# Patient Record
Sex: Female | Born: 1935
Health system: Southern US, Community
[De-identification: ages and names within clinical notes are randomized; demographics above are authoritative.]

## PROBLEM LIST (undated history)

## (undated) DIAGNOSIS — E119 Type 2 diabetes mellitus without complications: Secondary | ICD-10-CM

## (undated) DIAGNOSIS — D649 Anemia, unspecified: Secondary | ICD-10-CM

## (undated) DIAGNOSIS — M549 Dorsalgia, unspecified: Secondary | ICD-10-CM

## (undated) DIAGNOSIS — N2889 Other specified disorders of kidney and ureter: Secondary | ICD-10-CM

## (undated) DIAGNOSIS — I10 Essential (primary) hypertension: Secondary | ICD-10-CM

## (undated) DIAGNOSIS — E559 Vitamin D deficiency, unspecified: Secondary | ICD-10-CM

## (undated) DIAGNOSIS — C801 Malignant (primary) neoplasm, unspecified: Secondary | ICD-10-CM

## (undated) DIAGNOSIS — I499 Cardiac arrhythmia, unspecified: Secondary | ICD-10-CM

## (undated) DIAGNOSIS — I209 Angina pectoris, unspecified: Secondary | ICD-10-CM

## (undated) DIAGNOSIS — G8929 Other chronic pain: Secondary | ICD-10-CM

## (undated) DIAGNOSIS — I4891 Unspecified atrial fibrillation: Secondary | ICD-10-CM

## (undated) DIAGNOSIS — N189 Chronic kidney disease, unspecified: Secondary | ICD-10-CM

## (undated) DIAGNOSIS — E538 Deficiency of other specified B group vitamins: Secondary | ICD-10-CM

## (undated) DIAGNOSIS — M1990 Unspecified osteoarthritis, unspecified site: Secondary | ICD-10-CM

## (undated) DIAGNOSIS — I509 Heart failure, unspecified: Secondary | ICD-10-CM

## (undated) DIAGNOSIS — M810 Age-related osteoporosis without current pathological fracture: Secondary | ICD-10-CM

## (undated) HISTORY — PX: CATARACT EXTRACTION, BILATERAL: SHX1313

## (undated) HISTORY — PX: BREAST LUMPECTOMY: SHX2

## (undated) HISTORY — PX: CHOLECYSTECTOMY: SHX55

## (undated) HISTORY — PX: CARDIAC CATHETERIZATION: SHX172

## (undated) HISTORY — PX: TOTAL HIP ARTHROPLASTY: SHX124

---

## 2014-10-17 DIAGNOSIS — M545 Low back pain: Secondary | ICD-10-CM | POA: Diagnosis not present

## 2014-10-17 DIAGNOSIS — Z79899 Other long term (current) drug therapy: Secondary | ICD-10-CM | POA: Diagnosis not present

## 2014-10-17 DIAGNOSIS — E785 Hyperlipidemia, unspecified: Secondary | ICD-10-CM | POA: Diagnosis not present

## 2014-10-17 DIAGNOSIS — C50919 Malignant neoplasm of unspecified site of unspecified female breast: Secondary | ICD-10-CM | POA: Diagnosis not present

## 2014-10-17 DIAGNOSIS — M818 Other osteoporosis without current pathological fracture: Secondary | ICD-10-CM | POA: Diagnosis not present

## 2014-10-17 DIAGNOSIS — R32 Unspecified urinary incontinence: Secondary | ICD-10-CM | POA: Diagnosis not present

## 2014-10-17 DIAGNOSIS — E119 Type 2 diabetes mellitus without complications: Secondary | ICD-10-CM | POA: Diagnosis not present

## 2014-11-10 DIAGNOSIS — Z170 Estrogen receptor positive status [ER+]: Secondary | ICD-10-CM | POA: Diagnosis not present

## 2014-11-10 DIAGNOSIS — Z853 Personal history of malignant neoplasm of breast: Secondary | ICD-10-CM | POA: Diagnosis not present

## 2015-01-15 DIAGNOSIS — Z6831 Body mass index (BMI) 31.0-31.9, adult: Secondary | ICD-10-CM | POA: Diagnosis not present

## 2015-01-15 DIAGNOSIS — E669 Obesity, unspecified: Secondary | ICD-10-CM | POA: Diagnosis not present

## 2015-01-15 DIAGNOSIS — C50212 Malignant neoplasm of upper-inner quadrant of left female breast: Secondary | ICD-10-CM | POA: Diagnosis not present

## 2015-01-15 DIAGNOSIS — Z170 Estrogen receptor positive status [ER+]: Secondary | ICD-10-CM | POA: Diagnosis not present

## 2015-01-19 DIAGNOSIS — F329 Major depressive disorder, single episode, unspecified: Secondary | ICD-10-CM | POA: Diagnosis not present

## 2015-01-19 DIAGNOSIS — M545 Low back pain: Secondary | ICD-10-CM | POA: Diagnosis not present

## 2015-01-19 DIAGNOSIS — R32 Unspecified urinary incontinence: Secondary | ICD-10-CM | POA: Diagnosis not present

## 2015-01-19 DIAGNOSIS — Z79899 Other long term (current) drug therapy: Secondary | ICD-10-CM | POA: Diagnosis not present

## 2015-01-19 DIAGNOSIS — E559 Vitamin D deficiency, unspecified: Secondary | ICD-10-CM | POA: Diagnosis not present

## 2015-01-19 DIAGNOSIS — E785 Hyperlipidemia, unspecified: Secondary | ICD-10-CM | POA: Diagnosis not present

## 2015-01-19 DIAGNOSIS — E119 Type 2 diabetes mellitus without complications: Secondary | ICD-10-CM | POA: Diagnosis not present

## 2015-01-19 DIAGNOSIS — C50919 Malignant neoplasm of unspecified site of unspecified female breast: Secondary | ICD-10-CM | POA: Diagnosis not present

## 2015-01-31 DIAGNOSIS — Z9889 Other specified postprocedural states: Secondary | ICD-10-CM | POA: Diagnosis not present

## 2015-01-31 DIAGNOSIS — C50212 Malignant neoplasm of upper-inner quadrant of left female breast: Secondary | ICD-10-CM | POA: Diagnosis not present

## 2015-01-31 DIAGNOSIS — R928 Other abnormal and inconclusive findings on diagnostic imaging of breast: Secondary | ICD-10-CM | POA: Diagnosis not present

## 2015-02-08 DIAGNOSIS — R928 Other abnormal and inconclusive findings on diagnostic imaging of breast: Secondary | ICD-10-CM | POA: Diagnosis not present

## 2015-02-08 DIAGNOSIS — Z170 Estrogen receptor positive status [ER+]: Secondary | ICD-10-CM | POA: Diagnosis not present

## 2015-02-08 DIAGNOSIS — R920 Mammographic microcalcification found on diagnostic imaging of breast: Secondary | ICD-10-CM | POA: Diagnosis not present

## 2015-02-08 DIAGNOSIS — R921 Mammographic calcification found on diagnostic imaging of breast: Secondary | ICD-10-CM | POA: Diagnosis not present

## 2015-02-08 DIAGNOSIS — Z853 Personal history of malignant neoplasm of breast: Secondary | ICD-10-CM | POA: Diagnosis not present

## 2015-02-08 DIAGNOSIS — N6011 Diffuse cystic mastopathy of right breast: Secondary | ICD-10-CM | POA: Diagnosis not present

## 2015-02-08 DIAGNOSIS — N63 Unspecified lump in breast: Secondary | ICD-10-CM | POA: Diagnosis not present

## 2015-02-08 DIAGNOSIS — Z79811 Long term (current) use of aromatase inhibitors: Secondary | ICD-10-CM | POA: Diagnosis not present

## 2015-02-11 DIAGNOSIS — N6489 Other specified disorders of breast: Secondary | ICD-10-CM | POA: Diagnosis not present

## 2015-02-11 DIAGNOSIS — L7621 Postprocedural hemorrhage and hematoma of skin and subcutaneous tissue following a dermatologic procedure: Secondary | ICD-10-CM | POA: Diagnosis not present

## 2015-02-27 DIAGNOSIS — R609 Edema, unspecified: Secondary | ICD-10-CM | POA: Diagnosis not present

## 2015-02-27 DIAGNOSIS — R0602 Shortness of breath: Secondary | ICD-10-CM | POA: Diagnosis not present

## 2015-02-27 DIAGNOSIS — W57XXXA Bitten or stung by nonvenomous insect and other nonvenomous arthropods, initial encounter: Secondary | ICD-10-CM | POA: Diagnosis not present

## 2015-03-13 DIAGNOSIS — Z7689 Persons encountering health services in other specified circumstances: Secondary | ICD-10-CM | POA: Diagnosis not present

## 2015-03-13 DIAGNOSIS — R079 Chest pain, unspecified: Secondary | ICD-10-CM | POA: Diagnosis not present

## 2015-03-13 DIAGNOSIS — W57XXXA Bitten or stung by nonvenomous insect and other nonvenomous arthropods, initial encounter: Secondary | ICD-10-CM | POA: Diagnosis not present

## 2015-04-20 DIAGNOSIS — F419 Anxiety disorder, unspecified: Secondary | ICD-10-CM | POA: Diagnosis not present

## 2015-04-20 DIAGNOSIS — E785 Hyperlipidemia, unspecified: Secondary | ICD-10-CM | POA: Diagnosis not present

## 2015-04-20 DIAGNOSIS — Z6833 Body mass index (BMI) 33.0-33.9, adult: Secondary | ICD-10-CM | POA: Diagnosis not present

## 2015-04-20 DIAGNOSIS — M818 Other osteoporosis without current pathological fracture: Secondary | ICD-10-CM | POA: Diagnosis not present

## 2015-04-20 DIAGNOSIS — Z139 Encounter for screening, unspecified: Secondary | ICD-10-CM | POA: Diagnosis not present

## 2015-04-20 DIAGNOSIS — E119 Type 2 diabetes mellitus without complications: Secondary | ICD-10-CM | POA: Diagnosis not present

## 2015-05-11 DIAGNOSIS — Z853 Personal history of malignant neoplasm of breast: Secondary | ICD-10-CM | POA: Diagnosis not present

## 2015-05-11 DIAGNOSIS — Z79811 Long term (current) use of aromatase inhibitors: Secondary | ICD-10-CM | POA: Diagnosis not present

## 2015-07-09 DIAGNOSIS — Z6833 Body mass index (BMI) 33.0-33.9, adult: Secondary | ICD-10-CM | POA: Diagnosis not present

## 2015-07-09 DIAGNOSIS — Z23 Encounter for immunization: Secondary | ICD-10-CM | POA: Diagnosis not present

## 2015-07-09 DIAGNOSIS — E119 Type 2 diabetes mellitus without complications: Secondary | ICD-10-CM | POA: Diagnosis not present

## 2015-07-09 DIAGNOSIS — I4891 Unspecified atrial fibrillation: Secondary | ICD-10-CM | POA: Diagnosis not present

## 2015-07-23 DIAGNOSIS — Z79899 Other long term (current) drug therapy: Secondary | ICD-10-CM | POA: Diagnosis not present

## 2015-07-23 DIAGNOSIS — E559 Vitamin D deficiency, unspecified: Secondary | ICD-10-CM | POA: Diagnosis not present

## 2015-07-23 DIAGNOSIS — E119 Type 2 diabetes mellitus without complications: Secondary | ICD-10-CM | POA: Diagnosis not present

## 2015-07-23 DIAGNOSIS — E785 Hyperlipidemia, unspecified: Secondary | ICD-10-CM | POA: Diagnosis not present

## 2015-07-23 DIAGNOSIS — R42 Dizziness and giddiness: Secondary | ICD-10-CM | POA: Diagnosis not present

## 2015-08-07 DIAGNOSIS — S61219A Laceration without foreign body of unspecified finger without damage to nail, initial encounter: Secondary | ICD-10-CM | POA: Diagnosis not present

## 2015-08-07 DIAGNOSIS — I10 Essential (primary) hypertension: Secondary | ICD-10-CM | POA: Diagnosis not present

## 2015-08-07 DIAGNOSIS — E785 Hyperlipidemia, unspecified: Secondary | ICD-10-CM | POA: Diagnosis not present

## 2015-08-07 DIAGNOSIS — Z6833 Body mass index (BMI) 33.0-33.9, adult: Secondary | ICD-10-CM | POA: Diagnosis not present

## 2015-08-10 DIAGNOSIS — Z853 Personal history of malignant neoplasm of breast: Secondary | ICD-10-CM | POA: Diagnosis not present

## 2015-08-29 DIAGNOSIS — R42 Dizziness and giddiness: Secondary | ICD-10-CM | POA: Diagnosis not present

## 2015-09-27 DIAGNOSIS — R269 Unspecified abnormalities of gait and mobility: Secondary | ICD-10-CM | POA: Diagnosis not present

## 2015-09-27 DIAGNOSIS — R42 Dizziness and giddiness: Secondary | ICD-10-CM | POA: Diagnosis not present

## 2015-10-04 DIAGNOSIS — G319 Degenerative disease of nervous system, unspecified: Secondary | ICD-10-CM | POA: Diagnosis not present

## 2015-10-04 DIAGNOSIS — R42 Dizziness and giddiness: Secondary | ICD-10-CM | POA: Diagnosis not present

## 2015-10-08 DIAGNOSIS — R269 Unspecified abnormalities of gait and mobility: Secondary | ICD-10-CM | POA: Diagnosis not present

## 2015-10-08 DIAGNOSIS — R42 Dizziness and giddiness: Secondary | ICD-10-CM | POA: Diagnosis not present

## 2015-10-22 DIAGNOSIS — M545 Low back pain: Secondary | ICD-10-CM | POA: Diagnosis not present

## 2015-10-22 DIAGNOSIS — E119 Type 2 diabetes mellitus without complications: Secondary | ICD-10-CM | POA: Diagnosis not present

## 2015-10-22 DIAGNOSIS — I10 Essential (primary) hypertension: Secondary | ICD-10-CM | POA: Diagnosis not present

## 2015-10-22 DIAGNOSIS — M25551 Pain in right hip: Secondary | ICD-10-CM | POA: Diagnosis not present

## 2015-10-23 DIAGNOSIS — M25551 Pain in right hip: Secondary | ICD-10-CM | POA: Diagnosis not present

## 2015-10-23 DIAGNOSIS — M160 Bilateral primary osteoarthritis of hip: Secondary | ICD-10-CM | POA: Diagnosis not present

## 2015-10-26 DIAGNOSIS — M79604 Pain in right leg: Secondary | ICD-10-CM | POA: Diagnosis not present

## 2015-10-26 DIAGNOSIS — M6281 Muscle weakness (generalized): Secondary | ICD-10-CM | POA: Diagnosis not present

## 2015-10-26 DIAGNOSIS — R269 Unspecified abnormalities of gait and mobility: Secondary | ICD-10-CM | POA: Diagnosis not present

## 2015-10-26 DIAGNOSIS — R42 Dizziness and giddiness: Secondary | ICD-10-CM | POA: Diagnosis not present

## 2015-10-30 DIAGNOSIS — M1611 Unilateral primary osteoarthritis, right hip: Secondary | ICD-10-CM | POA: Diagnosis not present

## 2015-11-02 DIAGNOSIS — R269 Unspecified abnormalities of gait and mobility: Secondary | ICD-10-CM | POA: Diagnosis not present

## 2015-11-02 DIAGNOSIS — M6281 Muscle weakness (generalized): Secondary | ICD-10-CM | POA: Diagnosis not present

## 2015-11-02 DIAGNOSIS — M79604 Pain in right leg: Secondary | ICD-10-CM | POA: Diagnosis not present

## 2015-11-02 DIAGNOSIS — R42 Dizziness and giddiness: Secondary | ICD-10-CM | POA: Diagnosis not present

## 2015-11-09 DIAGNOSIS — Z79811 Long term (current) use of aromatase inhibitors: Secondary | ICD-10-CM | POA: Diagnosis not present

## 2015-11-09 DIAGNOSIS — Z853 Personal history of malignant neoplasm of breast: Secondary | ICD-10-CM | POA: Diagnosis not present

## 2015-11-09 DIAGNOSIS — Z170 Estrogen receptor positive status [ER+]: Secondary | ICD-10-CM | POA: Diagnosis not present

## 2015-11-09 DIAGNOSIS — C50212 Malignant neoplasm of upper-inner quadrant of left female breast: Secondary | ICD-10-CM | POA: Diagnosis not present

## 2015-11-09 DIAGNOSIS — M1990 Unspecified osteoarthritis, unspecified site: Secondary | ICD-10-CM | POA: Diagnosis not present

## 2015-11-12 DIAGNOSIS — M1611 Unilateral primary osteoarthritis, right hip: Secondary | ICD-10-CM | POA: Diagnosis not present

## 2015-11-12 DIAGNOSIS — M25551 Pain in right hip: Secondary | ICD-10-CM | POA: Diagnosis not present

## 2015-12-21 DIAGNOSIS — C50919 Malignant neoplasm of unspecified site of unspecified female breast: Secondary | ICD-10-CM | POA: Diagnosis not present

## 2015-12-21 DIAGNOSIS — M545 Low back pain: Secondary | ICD-10-CM | POA: Diagnosis not present

## 2015-12-21 DIAGNOSIS — M1611 Unilateral primary osteoarthritis, right hip: Secondary | ICD-10-CM | POA: Diagnosis not present

## 2016-01-14 DIAGNOSIS — I509 Heart failure, unspecified: Secondary | ICD-10-CM | POA: Diagnosis not present

## 2016-01-14 DIAGNOSIS — R079 Chest pain, unspecified: Secondary | ICD-10-CM | POA: Diagnosis not present

## 2016-01-14 DIAGNOSIS — R0602 Shortness of breath: Secondary | ICD-10-CM | POA: Diagnosis not present

## 2016-01-18 DIAGNOSIS — E119 Type 2 diabetes mellitus without complications: Secondary | ICD-10-CM | POA: Diagnosis not present

## 2016-01-18 DIAGNOSIS — I509 Heart failure, unspecified: Secondary | ICD-10-CM | POA: Diagnosis not present

## 2016-01-18 DIAGNOSIS — M545 Low back pain: Secondary | ICD-10-CM | POA: Diagnosis not present

## 2016-01-18 DIAGNOSIS — E785 Hyperlipidemia, unspecified: Secondary | ICD-10-CM | POA: Diagnosis not present

## 2016-01-23 DIAGNOSIS — E119 Type 2 diabetes mellitus without complications: Secondary | ICD-10-CM | POA: Diagnosis not present

## 2016-01-23 DIAGNOSIS — E785 Hyperlipidemia, unspecified: Secondary | ICD-10-CM | POA: Diagnosis not present

## 2016-02-08 DIAGNOSIS — M79604 Pain in right leg: Secondary | ICD-10-CM | POA: Diagnosis not present

## 2016-02-08 DIAGNOSIS — M25551 Pain in right hip: Secondary | ICD-10-CM | POA: Diagnosis not present

## 2016-02-08 DIAGNOSIS — R42 Dizziness and giddiness: Secondary | ICD-10-CM | POA: Diagnosis not present

## 2016-02-15 DIAGNOSIS — M1611 Unilateral primary osteoarthritis, right hip: Secondary | ICD-10-CM | POA: Diagnosis not present

## 2016-02-21 DIAGNOSIS — Z79899 Other long term (current) drug therapy: Secondary | ICD-10-CM | POA: Diagnosis not present

## 2016-02-21 DIAGNOSIS — Z01818 Encounter for other preprocedural examination: Secondary | ICD-10-CM | POA: Diagnosis not present

## 2016-02-21 DIAGNOSIS — M79609 Pain in unspecified limb: Secondary | ICD-10-CM | POA: Diagnosis not present

## 2016-02-21 DIAGNOSIS — Z01810 Encounter for preprocedural cardiovascular examination: Secondary | ICD-10-CM | POA: Diagnosis not present

## 2016-02-21 DIAGNOSIS — R52 Pain, unspecified: Secondary | ICD-10-CM | POA: Diagnosis not present

## 2016-02-21 DIAGNOSIS — R0602 Shortness of breath: Secondary | ICD-10-CM | POA: Diagnosis not present

## 2016-02-25 DIAGNOSIS — Z9889 Other specified postprocedural states: Secondary | ICD-10-CM | POA: Diagnosis not present

## 2016-02-25 DIAGNOSIS — R928 Other abnormal and inconclusive findings on diagnostic imaging of breast: Secondary | ICD-10-CM | POA: Diagnosis not present

## 2016-02-25 DIAGNOSIS — C50212 Malignant neoplasm of upper-inner quadrant of left female breast: Secondary | ICD-10-CM | POA: Diagnosis not present

## 2016-02-26 DIAGNOSIS — E559 Vitamin D deficiency, unspecified: Secondary | ICD-10-CM | POA: Diagnosis not present

## 2016-02-26 DIAGNOSIS — J449 Chronic obstructive pulmonary disease, unspecified: Secondary | ICD-10-CM | POA: Diagnosis not present

## 2016-02-26 DIAGNOSIS — R609 Edema, unspecified: Secondary | ICD-10-CM | POA: Diagnosis not present

## 2016-02-26 DIAGNOSIS — E119 Type 2 diabetes mellitus without complications: Secondary | ICD-10-CM | POA: Diagnosis not present

## 2016-02-26 DIAGNOSIS — I4891 Unspecified atrial fibrillation: Secondary | ICD-10-CM | POA: Diagnosis not present

## 2016-02-28 DIAGNOSIS — I119 Hypertensive heart disease without heart failure: Secondary | ICD-10-CM | POA: Insufficient documentation

## 2016-02-28 DIAGNOSIS — I4819 Other persistent atrial fibrillation: Secondary | ICD-10-CM | POA: Insufficient documentation

## 2016-02-28 DIAGNOSIS — Z01810 Encounter for preprocedural cardiovascular examination: Secondary | ICD-10-CM

## 2016-02-28 DIAGNOSIS — I4821 Permanent atrial fibrillation: Secondary | ICD-10-CM | POA: Insufficient documentation

## 2016-02-28 HISTORY — DX: Encounter for preprocedural cardiovascular examination: Z01.810

## 2016-02-28 HISTORY — DX: Other persistent atrial fibrillation: I48.19

## 2016-02-28 HISTORY — DX: Permanent atrial fibrillation: I48.21

## 2016-02-28 HISTORY — DX: Hypertensive heart disease without heart failure: I11.9

## 2016-02-29 DIAGNOSIS — E782 Mixed hyperlipidemia: Secondary | ICD-10-CM

## 2016-02-29 DIAGNOSIS — Z7901 Long term (current) use of anticoagulants: Secondary | ICD-10-CM | POA: Insufficient documentation

## 2016-02-29 DIAGNOSIS — Z01810 Encounter for preprocedural cardiovascular examination: Secondary | ICD-10-CM | POA: Diagnosis not present

## 2016-02-29 DIAGNOSIS — I10 Essential (primary) hypertension: Secondary | ICD-10-CM | POA: Diagnosis not present

## 2016-02-29 DIAGNOSIS — E119 Type 2 diabetes mellitus without complications: Secondary | ICD-10-CM | POA: Insufficient documentation

## 2016-02-29 DIAGNOSIS — R0602 Shortness of breath: Secondary | ICD-10-CM | POA: Diagnosis not present

## 2016-02-29 DIAGNOSIS — E785 Hyperlipidemia, unspecified: Secondary | ICD-10-CM | POA: Diagnosis not present

## 2016-02-29 HISTORY — DX: Long term (current) use of anticoagulants: Z79.01

## 2016-02-29 HISTORY — DX: Type 2 diabetes mellitus without complications: E11.9

## 2016-02-29 HISTORY — DX: Mixed hyperlipidemia: E78.2

## 2016-03-10 DIAGNOSIS — C50212 Malignant neoplasm of upper-inner quadrant of left female breast: Secondary | ICD-10-CM | POA: Insufficient documentation

## 2016-03-10 DIAGNOSIS — D499 Neoplasm of unspecified behavior of unspecified site: Secondary | ICD-10-CM | POA: Insufficient documentation

## 2016-03-10 DIAGNOSIS — Z170 Estrogen receptor positive status [ER+]: Secondary | ICD-10-CM | POA: Insufficient documentation

## 2016-03-10 DIAGNOSIS — C50112 Malignant neoplasm of central portion of left female breast: Secondary | ICD-10-CM | POA: Insufficient documentation

## 2016-03-10 HISTORY — DX: Neoplasm of unspecified behavior of unspecified site: Z17.0

## 2016-03-10 HISTORY — DX: Malignant neoplasm of upper-inner quadrant of left female breast: C50.212

## 2016-03-10 HISTORY — DX: Neoplasm of unspecified behavior of unspecified site: D49.9

## 2016-03-10 HISTORY — DX: Malignant neoplasm of central portion of left female breast: C50.112

## 2016-03-11 DIAGNOSIS — Z853 Personal history of malignant neoplasm of breast: Secondary | ICD-10-CM | POA: Diagnosis not present

## 2016-03-11 DIAGNOSIS — Z79811 Long term (current) use of aromatase inhibitors: Secondary | ICD-10-CM | POA: Diagnosis not present

## 2016-03-19 DIAGNOSIS — M8589 Other specified disorders of bone density and structure, multiple sites: Secondary | ICD-10-CM | POA: Diagnosis not present

## 2016-04-07 DIAGNOSIS — M1611 Unilateral primary osteoarthritis, right hip: Secondary | ICD-10-CM | POA: Diagnosis not present

## 2016-04-07 DIAGNOSIS — Z01818 Encounter for other preprocedural examination: Secondary | ICD-10-CM | POA: Diagnosis not present

## 2016-04-10 DIAGNOSIS — I482 Chronic atrial fibrillation: Secondary | ICD-10-CM | POA: Diagnosis not present

## 2016-04-10 DIAGNOSIS — Z885 Allergy status to narcotic agent status: Secondary | ICD-10-CM | POA: Diagnosis not present

## 2016-04-10 DIAGNOSIS — Z853 Personal history of malignant neoplasm of breast: Secondary | ICD-10-CM

## 2016-04-10 DIAGNOSIS — Z7401 Bed confinement status: Secondary | ICD-10-CM | POA: Diagnosis not present

## 2016-04-10 DIAGNOSIS — R29898 Other symptoms and signs involving the musculoskeletal system: Secondary | ICD-10-CM | POA: Diagnosis not present

## 2016-04-10 DIAGNOSIS — Z471 Aftercare following joint replacement surgery: Secondary | ICD-10-CM | POA: Diagnosis not present

## 2016-04-10 DIAGNOSIS — Z8679 Personal history of other diseases of the circulatory system: Secondary | ICD-10-CM | POA: Diagnosis not present

## 2016-04-10 DIAGNOSIS — E119 Type 2 diabetes mellitus without complications: Secondary | ICD-10-CM | POA: Diagnosis not present

## 2016-04-10 DIAGNOSIS — I4891 Unspecified atrial fibrillation: Secondary | ICD-10-CM | POA: Diagnosis not present

## 2016-04-10 DIAGNOSIS — I509 Heart failure, unspecified: Secondary | ICD-10-CM | POA: Diagnosis not present

## 2016-04-10 DIAGNOSIS — M25551 Pain in right hip: Secondary | ICD-10-CM | POA: Diagnosis not present

## 2016-04-10 DIAGNOSIS — Z96641 Presence of right artificial hip joint: Secondary | ICD-10-CM | POA: Diagnosis not present

## 2016-04-10 DIAGNOSIS — I10 Essential (primary) hypertension: Secondary | ICD-10-CM

## 2016-04-10 DIAGNOSIS — E1169 Type 2 diabetes mellitus with other specified complication: Secondary | ICD-10-CM | POA: Diagnosis not present

## 2016-04-10 DIAGNOSIS — M1611 Unilateral primary osteoarthritis, right hip: Secondary | ICD-10-CM | POA: Diagnosis not present

## 2016-04-10 DIAGNOSIS — M1990 Unspecified osteoarthritis, unspecified site: Secondary | ICD-10-CM | POA: Diagnosis not present

## 2016-04-10 DIAGNOSIS — Z7902 Long term (current) use of antithrombotics/antiplatelets: Secondary | ICD-10-CM | POA: Diagnosis not present

## 2016-04-10 DIAGNOSIS — Z888 Allergy status to other drugs, medicaments and biological substances status: Secondary | ICD-10-CM | POA: Diagnosis not present

## 2016-04-10 DIAGNOSIS — Z79899 Other long term (current) drug therapy: Secondary | ICD-10-CM | POA: Diagnosis not present

## 2016-04-10 DIAGNOSIS — F329 Major depressive disorder, single episode, unspecified: Secondary | ICD-10-CM

## 2016-04-10 DIAGNOSIS — E785 Hyperlipidemia, unspecified: Secondary | ICD-10-CM

## 2016-04-12 DIAGNOSIS — G8918 Other acute postprocedural pain: Secondary | ICD-10-CM | POA: Diagnosis not present

## 2016-04-12 DIAGNOSIS — I482 Chronic atrial fibrillation: Secondary | ICD-10-CM | POA: Diagnosis not present

## 2016-04-12 DIAGNOSIS — D649 Anemia, unspecified: Secondary | ICD-10-CM | POA: Diagnosis not present

## 2016-04-12 DIAGNOSIS — R262 Difficulty in walking, not elsewhere classified: Secondary | ICD-10-CM | POA: Diagnosis not present

## 2016-04-12 DIAGNOSIS — Z7401 Bed confinement status: Secondary | ICD-10-CM | POA: Diagnosis not present

## 2016-04-12 DIAGNOSIS — Z96641 Presence of right artificial hip joint: Secondary | ICD-10-CM | POA: Diagnosis not present

## 2016-04-12 DIAGNOSIS — I10 Essential (primary) hypertension: Secondary | ICD-10-CM | POA: Diagnosis not present

## 2016-04-12 DIAGNOSIS — E119 Type 2 diabetes mellitus without complications: Secondary | ICD-10-CM | POA: Diagnosis not present

## 2016-04-12 DIAGNOSIS — M1611 Unilateral primary osteoarthritis, right hip: Secondary | ICD-10-CM | POA: Diagnosis not present

## 2016-04-12 DIAGNOSIS — Z8679 Personal history of other diseases of the circulatory system: Secondary | ICD-10-CM | POA: Diagnosis not present

## 2016-04-12 DIAGNOSIS — R29898 Other symptoms and signs involving the musculoskeletal system: Secondary | ICD-10-CM | POA: Diagnosis not present

## 2016-04-12 DIAGNOSIS — Z471 Aftercare following joint replacement surgery: Secondary | ICD-10-CM | POA: Diagnosis not present

## 2016-04-12 DIAGNOSIS — E1169 Type 2 diabetes mellitus with other specified complication: Secondary | ICD-10-CM | POA: Diagnosis not present

## 2016-04-15 DIAGNOSIS — Z96641 Presence of right artificial hip joint: Secondary | ICD-10-CM | POA: Diagnosis not present

## 2016-04-15 DIAGNOSIS — D649 Anemia, unspecified: Secondary | ICD-10-CM | POA: Diagnosis not present

## 2016-04-15 DIAGNOSIS — R262 Difficulty in walking, not elsewhere classified: Secondary | ICD-10-CM | POA: Diagnosis not present

## 2016-04-15 DIAGNOSIS — G8918 Other acute postprocedural pain: Secondary | ICD-10-CM | POA: Diagnosis not present

## 2016-05-03 DIAGNOSIS — Z96651 Presence of right artificial knee joint: Secondary | ICD-10-CM | POA: Diagnosis not present

## 2016-05-03 DIAGNOSIS — Z471 Aftercare following joint replacement surgery: Secondary | ICD-10-CM | POA: Diagnosis not present

## 2016-05-03 DIAGNOSIS — Z7901 Long term (current) use of anticoagulants: Secondary | ICD-10-CM | POA: Diagnosis not present

## 2016-05-03 DIAGNOSIS — Z7984 Long term (current) use of oral hypoglycemic drugs: Secondary | ICD-10-CM | POA: Diagnosis not present

## 2016-05-03 DIAGNOSIS — Z9181 History of falling: Secondary | ICD-10-CM | POA: Diagnosis not present

## 2016-05-03 DIAGNOSIS — Z853 Personal history of malignant neoplasm of breast: Secondary | ICD-10-CM | POA: Diagnosis not present

## 2016-05-03 DIAGNOSIS — E119 Type 2 diabetes mellitus without complications: Secondary | ICD-10-CM | POA: Diagnosis not present

## 2016-05-03 DIAGNOSIS — I10 Essential (primary) hypertension: Secondary | ICD-10-CM | POA: Diagnosis not present

## 2016-05-06 DIAGNOSIS — E119 Type 2 diabetes mellitus without complications: Secondary | ICD-10-CM | POA: Diagnosis not present

## 2016-05-06 DIAGNOSIS — I10 Essential (primary) hypertension: Secondary | ICD-10-CM | POA: Diagnosis not present

## 2016-05-06 DIAGNOSIS — Z9181 History of falling: Secondary | ICD-10-CM | POA: Diagnosis not present

## 2016-05-06 DIAGNOSIS — Z853 Personal history of malignant neoplasm of breast: Secondary | ICD-10-CM | POA: Diagnosis not present

## 2016-05-06 DIAGNOSIS — Z471 Aftercare following joint replacement surgery: Secondary | ICD-10-CM | POA: Diagnosis not present

## 2016-05-06 DIAGNOSIS — Z7901 Long term (current) use of anticoagulants: Secondary | ICD-10-CM | POA: Diagnosis not present

## 2016-05-06 DIAGNOSIS — Z7984 Long term (current) use of oral hypoglycemic drugs: Secondary | ICD-10-CM | POA: Diagnosis not present

## 2016-05-06 DIAGNOSIS — Z96651 Presence of right artificial knee joint: Secondary | ICD-10-CM | POA: Diagnosis not present

## 2016-05-08 DIAGNOSIS — Z853 Personal history of malignant neoplasm of breast: Secondary | ICD-10-CM | POA: Diagnosis not present

## 2016-05-08 DIAGNOSIS — Z96651 Presence of right artificial knee joint: Secondary | ICD-10-CM | POA: Diagnosis not present

## 2016-05-08 DIAGNOSIS — Z7901 Long term (current) use of anticoagulants: Secondary | ICD-10-CM | POA: Diagnosis not present

## 2016-05-08 DIAGNOSIS — E785 Hyperlipidemia, unspecified: Secondary | ICD-10-CM | POA: Diagnosis not present

## 2016-05-08 DIAGNOSIS — Z9181 History of falling: Secondary | ICD-10-CM | POA: Diagnosis not present

## 2016-05-08 DIAGNOSIS — Z471 Aftercare following joint replacement surgery: Secondary | ICD-10-CM | POA: Diagnosis not present

## 2016-05-08 DIAGNOSIS — Z7984 Long term (current) use of oral hypoglycemic drugs: Secondary | ICD-10-CM | POA: Diagnosis not present

## 2016-05-08 DIAGNOSIS — I509 Heart failure, unspecified: Secondary | ICD-10-CM | POA: Diagnosis not present

## 2016-05-08 DIAGNOSIS — E559 Vitamin D deficiency, unspecified: Secondary | ICD-10-CM | POA: Diagnosis not present

## 2016-05-08 DIAGNOSIS — C50919 Malignant neoplasm of unspecified site of unspecified female breast: Secondary | ICD-10-CM | POA: Diagnosis not present

## 2016-05-08 DIAGNOSIS — Z79899 Other long term (current) drug therapy: Secondary | ICD-10-CM | POA: Diagnosis not present

## 2016-05-08 DIAGNOSIS — I10 Essential (primary) hypertension: Secondary | ICD-10-CM | POA: Diagnosis not present

## 2016-05-08 DIAGNOSIS — D649 Anemia, unspecified: Secondary | ICD-10-CM | POA: Diagnosis not present

## 2016-05-08 DIAGNOSIS — E119 Type 2 diabetes mellitus without complications: Secondary | ICD-10-CM | POA: Diagnosis not present

## 2016-05-12 DIAGNOSIS — Z96651 Presence of right artificial knee joint: Secondary | ICD-10-CM | POA: Diagnosis not present

## 2016-05-12 DIAGNOSIS — Z9181 History of falling: Secondary | ICD-10-CM | POA: Diagnosis not present

## 2016-05-12 DIAGNOSIS — Z7901 Long term (current) use of anticoagulants: Secondary | ICD-10-CM | POA: Diagnosis not present

## 2016-05-12 DIAGNOSIS — E119 Type 2 diabetes mellitus without complications: Secondary | ICD-10-CM | POA: Diagnosis not present

## 2016-05-12 DIAGNOSIS — Z471 Aftercare following joint replacement surgery: Secondary | ICD-10-CM | POA: Diagnosis not present

## 2016-05-12 DIAGNOSIS — Z7984 Long term (current) use of oral hypoglycemic drugs: Secondary | ICD-10-CM | POA: Diagnosis not present

## 2016-05-12 DIAGNOSIS — I10 Essential (primary) hypertension: Secondary | ICD-10-CM | POA: Diagnosis not present

## 2016-05-12 DIAGNOSIS — Z853 Personal history of malignant neoplasm of breast: Secondary | ICD-10-CM | POA: Diagnosis not present

## 2016-05-13 DIAGNOSIS — Z96641 Presence of right artificial hip joint: Secondary | ICD-10-CM | POA: Diagnosis not present

## 2016-05-15 DIAGNOSIS — Z471 Aftercare following joint replacement surgery: Secondary | ICD-10-CM | POA: Diagnosis not present

## 2016-05-15 DIAGNOSIS — E119 Type 2 diabetes mellitus without complications: Secondary | ICD-10-CM | POA: Diagnosis not present

## 2016-05-15 DIAGNOSIS — I10 Essential (primary) hypertension: Secondary | ICD-10-CM | POA: Diagnosis not present

## 2016-05-15 DIAGNOSIS — Z7901 Long term (current) use of anticoagulants: Secondary | ICD-10-CM | POA: Diagnosis not present

## 2016-05-15 DIAGNOSIS — Z853 Personal history of malignant neoplasm of breast: Secondary | ICD-10-CM | POA: Diagnosis not present

## 2016-05-15 DIAGNOSIS — Z96651 Presence of right artificial knee joint: Secondary | ICD-10-CM | POA: Diagnosis not present

## 2016-05-15 DIAGNOSIS — Z9181 History of falling: Secondary | ICD-10-CM | POA: Diagnosis not present

## 2016-05-15 DIAGNOSIS — Z7984 Long term (current) use of oral hypoglycemic drugs: Secondary | ICD-10-CM | POA: Diagnosis not present

## 2016-05-18 DIAGNOSIS — M545 Low back pain: Secondary | ICD-10-CM | POA: Diagnosis not present

## 2016-05-18 DIAGNOSIS — M1612 Unilateral primary osteoarthritis, left hip: Secondary | ICD-10-CM | POA: Diagnosis not present

## 2016-05-18 DIAGNOSIS — M25552 Pain in left hip: Secondary | ICD-10-CM | POA: Diagnosis not present

## 2016-05-18 DIAGNOSIS — S3992XA Unspecified injury of lower back, initial encounter: Secondary | ICD-10-CM | POA: Diagnosis not present

## 2016-05-18 DIAGNOSIS — M1610 Unilateral primary osteoarthritis, unspecified hip: Secondary | ICD-10-CM | POA: Diagnosis not present

## 2016-05-18 DIAGNOSIS — S79912A Unspecified injury of left hip, initial encounter: Secondary | ICD-10-CM | POA: Diagnosis not present

## 2016-05-19 DIAGNOSIS — Z471 Aftercare following joint replacement surgery: Secondary | ICD-10-CM | POA: Diagnosis not present

## 2016-05-19 DIAGNOSIS — Z7901 Long term (current) use of anticoagulants: Secondary | ICD-10-CM | POA: Diagnosis not present

## 2016-05-19 DIAGNOSIS — Z853 Personal history of malignant neoplasm of breast: Secondary | ICD-10-CM | POA: Diagnosis not present

## 2016-05-19 DIAGNOSIS — Z9181 History of falling: Secondary | ICD-10-CM | POA: Diagnosis not present

## 2016-05-19 DIAGNOSIS — Z96651 Presence of right artificial knee joint: Secondary | ICD-10-CM | POA: Diagnosis not present

## 2016-05-19 DIAGNOSIS — I10 Essential (primary) hypertension: Secondary | ICD-10-CM | POA: Diagnosis not present

## 2016-05-19 DIAGNOSIS — Z7984 Long term (current) use of oral hypoglycemic drugs: Secondary | ICD-10-CM | POA: Diagnosis not present

## 2016-05-19 DIAGNOSIS — E119 Type 2 diabetes mellitus without complications: Secondary | ICD-10-CM | POA: Diagnosis not present

## 2016-05-22 DIAGNOSIS — Z471 Aftercare following joint replacement surgery: Secondary | ICD-10-CM | POA: Diagnosis not present

## 2016-05-22 DIAGNOSIS — Z96651 Presence of right artificial knee joint: Secondary | ICD-10-CM | POA: Diagnosis not present

## 2016-05-22 DIAGNOSIS — I10 Essential (primary) hypertension: Secondary | ICD-10-CM | POA: Diagnosis not present

## 2016-05-22 DIAGNOSIS — Z853 Personal history of malignant neoplasm of breast: Secondary | ICD-10-CM | POA: Diagnosis not present

## 2016-05-22 DIAGNOSIS — Z9181 History of falling: Secondary | ICD-10-CM | POA: Diagnosis not present

## 2016-05-22 DIAGNOSIS — Z7984 Long term (current) use of oral hypoglycemic drugs: Secondary | ICD-10-CM | POA: Diagnosis not present

## 2016-05-22 DIAGNOSIS — E119 Type 2 diabetes mellitus without complications: Secondary | ICD-10-CM | POA: Diagnosis not present

## 2016-05-22 DIAGNOSIS — Z7901 Long term (current) use of anticoagulants: Secondary | ICD-10-CM | POA: Diagnosis not present

## 2016-05-27 DIAGNOSIS — Z7901 Long term (current) use of anticoagulants: Secondary | ICD-10-CM | POA: Diagnosis not present

## 2016-05-27 DIAGNOSIS — I10 Essential (primary) hypertension: Secondary | ICD-10-CM | POA: Diagnosis not present

## 2016-05-27 DIAGNOSIS — Z9181 History of falling: Secondary | ICD-10-CM | POA: Diagnosis not present

## 2016-05-27 DIAGNOSIS — Z853 Personal history of malignant neoplasm of breast: Secondary | ICD-10-CM | POA: Diagnosis not present

## 2016-05-27 DIAGNOSIS — Z471 Aftercare following joint replacement surgery: Secondary | ICD-10-CM | POA: Diagnosis not present

## 2016-05-27 DIAGNOSIS — E119 Type 2 diabetes mellitus without complications: Secondary | ICD-10-CM | POA: Diagnosis not present

## 2016-05-27 DIAGNOSIS — Z96651 Presence of right artificial knee joint: Secondary | ICD-10-CM | POA: Diagnosis not present

## 2016-05-27 DIAGNOSIS — Z7984 Long term (current) use of oral hypoglycemic drugs: Secondary | ICD-10-CM | POA: Diagnosis not present

## 2016-05-28 DIAGNOSIS — Z7901 Long term (current) use of anticoagulants: Secondary | ICD-10-CM | POA: Diagnosis not present

## 2016-05-28 DIAGNOSIS — Z853 Personal history of malignant neoplasm of breast: Secondary | ICD-10-CM | POA: Diagnosis not present

## 2016-05-28 DIAGNOSIS — E119 Type 2 diabetes mellitus without complications: Secondary | ICD-10-CM | POA: Diagnosis not present

## 2016-05-28 DIAGNOSIS — Z9181 History of falling: Secondary | ICD-10-CM | POA: Diagnosis not present

## 2016-05-28 DIAGNOSIS — I10 Essential (primary) hypertension: Secondary | ICD-10-CM | POA: Diagnosis not present

## 2016-05-28 DIAGNOSIS — Z471 Aftercare following joint replacement surgery: Secondary | ICD-10-CM | POA: Diagnosis not present

## 2016-05-28 DIAGNOSIS — Z7984 Long term (current) use of oral hypoglycemic drugs: Secondary | ICD-10-CM | POA: Diagnosis not present

## 2016-05-28 DIAGNOSIS — Z96651 Presence of right artificial knee joint: Secondary | ICD-10-CM | POA: Diagnosis not present

## 2016-05-30 DIAGNOSIS — Z7901 Long term (current) use of anticoagulants: Secondary | ICD-10-CM | POA: Diagnosis not present

## 2016-05-30 DIAGNOSIS — E119 Type 2 diabetes mellitus without complications: Secondary | ICD-10-CM | POA: Diagnosis not present

## 2016-05-30 DIAGNOSIS — Z7984 Long term (current) use of oral hypoglycemic drugs: Secondary | ICD-10-CM | POA: Diagnosis not present

## 2016-05-30 DIAGNOSIS — Z853 Personal history of malignant neoplasm of breast: Secondary | ICD-10-CM | POA: Diagnosis not present

## 2016-05-30 DIAGNOSIS — Z96651 Presence of right artificial knee joint: Secondary | ICD-10-CM | POA: Diagnosis not present

## 2016-05-30 DIAGNOSIS — I10 Essential (primary) hypertension: Secondary | ICD-10-CM | POA: Diagnosis not present

## 2016-05-30 DIAGNOSIS — Z9181 History of falling: Secondary | ICD-10-CM | POA: Diagnosis not present

## 2016-05-30 DIAGNOSIS — Z471 Aftercare following joint replacement surgery: Secondary | ICD-10-CM | POA: Diagnosis not present

## 2016-06-03 DIAGNOSIS — Z471 Aftercare following joint replacement surgery: Secondary | ICD-10-CM | POA: Diagnosis not present

## 2016-06-03 DIAGNOSIS — I10 Essential (primary) hypertension: Secondary | ICD-10-CM | POA: Diagnosis not present

## 2016-06-03 DIAGNOSIS — Z96651 Presence of right artificial knee joint: Secondary | ICD-10-CM | POA: Diagnosis not present

## 2016-06-03 DIAGNOSIS — Z7901 Long term (current) use of anticoagulants: Secondary | ICD-10-CM | POA: Diagnosis not present

## 2016-06-03 DIAGNOSIS — Z9181 History of falling: Secondary | ICD-10-CM | POA: Diagnosis not present

## 2016-06-03 DIAGNOSIS — E119 Type 2 diabetes mellitus without complications: Secondary | ICD-10-CM | POA: Diagnosis not present

## 2016-06-03 DIAGNOSIS — Z853 Personal history of malignant neoplasm of breast: Secondary | ICD-10-CM | POA: Diagnosis not present

## 2016-06-03 DIAGNOSIS — Z7984 Long term (current) use of oral hypoglycemic drugs: Secondary | ICD-10-CM | POA: Diagnosis not present

## 2016-06-06 DIAGNOSIS — Z23 Encounter for immunization: Secondary | ICD-10-CM | POA: Diagnosis not present

## 2016-06-06 DIAGNOSIS — I509 Heart failure, unspecified: Secondary | ICD-10-CM | POA: Diagnosis not present

## 2016-06-06 DIAGNOSIS — E119 Type 2 diabetes mellitus without complications: Secondary | ICD-10-CM | POA: Diagnosis not present

## 2016-06-06 DIAGNOSIS — E785 Hyperlipidemia, unspecified: Secondary | ICD-10-CM | POA: Diagnosis not present

## 2016-06-06 DIAGNOSIS — D649 Anemia, unspecified: Secondary | ICD-10-CM | POA: Diagnosis not present

## 2016-06-06 DIAGNOSIS — I10 Essential (primary) hypertension: Secondary | ICD-10-CM | POA: Diagnosis not present

## 2016-06-06 DIAGNOSIS — I4891 Unspecified atrial fibrillation: Secondary | ICD-10-CM | POA: Diagnosis not present

## 2016-06-09 DIAGNOSIS — I10 Essential (primary) hypertension: Secondary | ICD-10-CM | POA: Diagnosis not present

## 2016-06-09 DIAGNOSIS — R55 Syncope and collapse: Secondary | ICD-10-CM | POA: Diagnosis not present

## 2016-06-09 DIAGNOSIS — Z7901 Long term (current) use of anticoagulants: Secondary | ICD-10-CM | POA: Diagnosis not present

## 2016-06-09 DIAGNOSIS — E119 Type 2 diabetes mellitus without complications: Secondary | ICD-10-CM | POA: Diagnosis not present

## 2016-06-09 DIAGNOSIS — Z7984 Long term (current) use of oral hypoglycemic drugs: Secondary | ICD-10-CM | POA: Diagnosis not present

## 2016-06-09 DIAGNOSIS — Z96651 Presence of right artificial knee joint: Secondary | ICD-10-CM | POA: Diagnosis not present

## 2016-06-09 DIAGNOSIS — Z853 Personal history of malignant neoplasm of breast: Secondary | ICD-10-CM | POA: Diagnosis not present

## 2016-06-09 DIAGNOSIS — Z9181 History of falling: Secondary | ICD-10-CM | POA: Diagnosis not present

## 2016-06-09 DIAGNOSIS — Z471 Aftercare following joint replacement surgery: Secondary | ICD-10-CM | POA: Diagnosis not present

## 2016-06-13 DIAGNOSIS — E1165 Type 2 diabetes mellitus with hyperglycemia: Secondary | ICD-10-CM | POA: Diagnosis not present

## 2016-06-13 DIAGNOSIS — Z9181 History of falling: Secondary | ICD-10-CM | POA: Diagnosis not present

## 2016-06-13 DIAGNOSIS — I10 Essential (primary) hypertension: Secondary | ICD-10-CM | POA: Diagnosis not present

## 2016-06-22 DIAGNOSIS — R079 Chest pain, unspecified: Secondary | ICD-10-CM | POA: Diagnosis not present

## 2016-06-22 DIAGNOSIS — M1990 Unspecified osteoarthritis, unspecified site: Secondary | ICD-10-CM | POA: Diagnosis not present

## 2016-06-22 DIAGNOSIS — Z7902 Long term (current) use of antithrombotics/antiplatelets: Secondary | ICD-10-CM | POA: Diagnosis not present

## 2016-06-22 DIAGNOSIS — M25512 Pain in left shoulder: Secondary | ICD-10-CM | POA: Diagnosis not present

## 2016-06-22 DIAGNOSIS — J189 Pneumonia, unspecified organism: Secondary | ICD-10-CM | POA: Diagnosis not present

## 2016-06-22 DIAGNOSIS — I509 Heart failure, unspecified: Secondary | ICD-10-CM | POA: Diagnosis not present

## 2016-06-22 DIAGNOSIS — Z7901 Long term (current) use of anticoagulants: Secondary | ICD-10-CM | POA: Diagnosis not present

## 2016-06-22 DIAGNOSIS — E785 Hyperlipidemia, unspecified: Secondary | ICD-10-CM

## 2016-06-22 DIAGNOSIS — E1169 Type 2 diabetes mellitus with other specified complication: Secondary | ICD-10-CM

## 2016-06-22 DIAGNOSIS — M25511 Pain in right shoulder: Secondary | ICD-10-CM | POA: Diagnosis not present

## 2016-06-22 DIAGNOSIS — J811 Chronic pulmonary edema: Secondary | ICD-10-CM | POA: Diagnosis not present

## 2016-06-22 DIAGNOSIS — I110 Hypertensive heart disease with heart failure: Secondary | ICD-10-CM | POA: Diagnosis not present

## 2016-06-22 DIAGNOSIS — I482 Chronic atrial fibrillation: Secondary | ICD-10-CM

## 2016-06-22 DIAGNOSIS — E119 Type 2 diabetes mellitus without complications: Secondary | ICD-10-CM | POA: Diagnosis not present

## 2016-06-22 DIAGNOSIS — R0602 Shortness of breath: Secondary | ICD-10-CM | POA: Diagnosis not present

## 2016-06-22 DIAGNOSIS — Z8679 Personal history of other diseases of the circulatory system: Secondary | ICD-10-CM | POA: Diagnosis not present

## 2016-06-22 DIAGNOSIS — Z853 Personal history of malignant neoplasm of breast: Secondary | ICD-10-CM | POA: Diagnosis not present

## 2016-06-22 DIAGNOSIS — Z888 Allergy status to other drugs, medicaments and biological substances status: Secondary | ICD-10-CM | POA: Diagnosis not present

## 2016-06-22 DIAGNOSIS — Z79899 Other long term (current) drug therapy: Secondary | ICD-10-CM | POA: Diagnosis not present

## 2016-06-22 DIAGNOSIS — R0789 Other chest pain: Secondary | ICD-10-CM | POA: Diagnosis not present

## 2016-06-22 DIAGNOSIS — I10 Essential (primary) hypertension: Secondary | ICD-10-CM

## 2016-06-22 DIAGNOSIS — Z96641 Presence of right artificial hip joint: Secondary | ICD-10-CM

## 2016-06-22 DIAGNOSIS — I272 Pulmonary hypertension, unspecified: Secondary | ICD-10-CM | POA: Diagnosis not present

## 2016-06-22 DIAGNOSIS — I200 Unstable angina: Secondary | ICD-10-CM | POA: Diagnosis not present

## 2016-06-22 DIAGNOSIS — I5030 Unspecified diastolic (congestive) heart failure: Secondary | ICD-10-CM | POA: Diagnosis not present

## 2016-06-22 DIAGNOSIS — I2729 Other secondary pulmonary hypertension: Secondary | ICD-10-CM | POA: Diagnosis not present

## 2016-06-22 DIAGNOSIS — J810 Acute pulmonary edema: Secondary | ICD-10-CM | POA: Diagnosis not present

## 2016-06-22 DIAGNOSIS — I5033 Acute on chronic diastolic (congestive) heart failure: Secondary | ICD-10-CM | POA: Diagnosis not present

## 2016-06-23 DIAGNOSIS — I272 Pulmonary hypertension, unspecified: Secondary | ICD-10-CM

## 2016-06-24 DIAGNOSIS — I5030 Unspecified diastolic (congestive) heart failure: Secondary | ICD-10-CM

## 2016-06-25 DIAGNOSIS — R079 Chest pain, unspecified: Secondary | ICD-10-CM | POA: Diagnosis not present

## 2016-06-25 DIAGNOSIS — E119 Type 2 diabetes mellitus without complications: Secondary | ICD-10-CM

## 2016-06-25 DIAGNOSIS — Z7901 Long term (current) use of anticoagulants: Secondary | ICD-10-CM

## 2016-06-25 DIAGNOSIS — M25511 Pain in right shoulder: Secondary | ICD-10-CM

## 2016-06-25 DIAGNOSIS — J811 Chronic pulmonary edema: Secondary | ICD-10-CM | POA: Diagnosis not present

## 2016-06-25 DIAGNOSIS — J189 Pneumonia, unspecified organism: Secondary | ICD-10-CM | POA: Diagnosis not present

## 2016-06-25 DIAGNOSIS — I5033 Acute on chronic diastolic (congestive) heart failure: Secondary | ICD-10-CM

## 2016-06-25 DIAGNOSIS — I4891 Unspecified atrial fibrillation: Secondary | ICD-10-CM

## 2016-06-27 DIAGNOSIS — I10 Essential (primary) hypertension: Secondary | ICD-10-CM | POA: Diagnosis not present

## 2016-06-27 DIAGNOSIS — Z79899 Other long term (current) drug therapy: Secondary | ICD-10-CM | POA: Diagnosis not present

## 2016-06-27 DIAGNOSIS — E041 Nontoxic single thyroid nodule: Secondary | ICD-10-CM | POA: Diagnosis not present

## 2016-07-01 DIAGNOSIS — M1611 Unilateral primary osteoarthritis, right hip: Secondary | ICD-10-CM | POA: Diagnosis not present

## 2016-07-04 DIAGNOSIS — E049 Nontoxic goiter, unspecified: Secondary | ICD-10-CM | POA: Diagnosis not present

## 2016-07-04 DIAGNOSIS — E041 Nontoxic single thyroid nodule: Secondary | ICD-10-CM | POA: Diagnosis not present

## 2016-07-11 DIAGNOSIS — Z853 Personal history of malignant neoplasm of breast: Secondary | ICD-10-CM | POA: Diagnosis not present

## 2016-07-11 DIAGNOSIS — Z79811 Long term (current) use of aromatase inhibitors: Secondary | ICD-10-CM | POA: Diagnosis not present

## 2016-07-15 DIAGNOSIS — I110 Hypertensive heart disease with heart failure: Secondary | ICD-10-CM | POA: Diagnosis not present

## 2016-07-15 DIAGNOSIS — I5032 Chronic diastolic (congestive) heart failure: Secondary | ICD-10-CM | POA: Diagnosis not present

## 2016-07-15 DIAGNOSIS — I2721 Secondary pulmonary arterial hypertension: Secondary | ICD-10-CM | POA: Diagnosis not present

## 2016-07-15 DIAGNOSIS — I481 Persistent atrial fibrillation: Secondary | ICD-10-CM | POA: Diagnosis not present

## 2016-07-28 DIAGNOSIS — I5032 Chronic diastolic (congestive) heart failure: Secondary | ICD-10-CM | POA: Diagnosis not present

## 2016-08-25 DIAGNOSIS — E785 Hyperlipidemia, unspecified: Secondary | ICD-10-CM | POA: Diagnosis not present

## 2016-08-25 DIAGNOSIS — Z1389 Encounter for screening for other disorder: Secondary | ICD-10-CM | POA: Diagnosis not present

## 2016-08-25 DIAGNOSIS — E119 Type 2 diabetes mellitus without complications: Secondary | ICD-10-CM | POA: Diagnosis not present

## 2016-08-25 DIAGNOSIS — I10 Essential (primary) hypertension: Secondary | ICD-10-CM | POA: Diagnosis not present

## 2016-08-25 DIAGNOSIS — I50810 Right heart failure, unspecified: Secondary | ICD-10-CM | POA: Diagnosis not present

## 2016-09-23 DIAGNOSIS — I4891 Unspecified atrial fibrillation: Secondary | ICD-10-CM | POA: Diagnosis not present

## 2016-09-23 DIAGNOSIS — R0602 Shortness of breath: Secondary | ICD-10-CM | POA: Diagnosis not present

## 2016-09-23 DIAGNOSIS — E119 Type 2 diabetes mellitus without complications: Secondary | ICD-10-CM | POA: Diagnosis not present

## 2016-09-23 DIAGNOSIS — I50810 Right heart failure, unspecified: Secondary | ICD-10-CM | POA: Diagnosis not present

## 2016-09-23 DIAGNOSIS — J449 Chronic obstructive pulmonary disease, unspecified: Secondary | ICD-10-CM | POA: Diagnosis not present

## 2016-09-23 DIAGNOSIS — E785 Hyperlipidemia, unspecified: Secondary | ICD-10-CM | POA: Diagnosis not present

## 2016-10-03 DIAGNOSIS — M1611 Unilateral primary osteoarthritis, right hip: Secondary | ICD-10-CM | POA: Diagnosis not present

## 2016-10-07 DIAGNOSIS — M1612 Unilateral primary osteoarthritis, left hip: Secondary | ICD-10-CM | POA: Diagnosis not present

## 2016-10-16 DIAGNOSIS — M1612 Unilateral primary osteoarthritis, left hip: Secondary | ICD-10-CM | POA: Diagnosis not present

## 2016-10-16 DIAGNOSIS — M25552 Pain in left hip: Secondary | ICD-10-CM | POA: Diagnosis not present

## 2016-11-06 DIAGNOSIS — M1612 Unilateral primary osteoarthritis, left hip: Secondary | ICD-10-CM | POA: Diagnosis not present

## 2016-11-06 DIAGNOSIS — M5416 Radiculopathy, lumbar region: Secondary | ICD-10-CM | POA: Diagnosis not present

## 2016-11-11 DIAGNOSIS — M5117 Intervertebral disc disorders with radiculopathy, lumbosacral region: Secondary | ICD-10-CM | POA: Diagnosis not present

## 2016-11-11 DIAGNOSIS — M48061 Spinal stenosis, lumbar region without neurogenic claudication: Secondary | ICD-10-CM | POA: Diagnosis not present

## 2016-11-12 DIAGNOSIS — Z853 Personal history of malignant neoplasm of breast: Secondary | ICD-10-CM | POA: Diagnosis not present

## 2016-11-18 DIAGNOSIS — M1612 Unilateral primary osteoarthritis, left hip: Secondary | ICD-10-CM | POA: Diagnosis not present

## 2016-11-24 DIAGNOSIS — D649 Anemia, unspecified: Secondary | ICD-10-CM | POA: Diagnosis not present

## 2016-11-24 DIAGNOSIS — E119 Type 2 diabetes mellitus without complications: Secondary | ICD-10-CM | POA: Diagnosis not present

## 2016-11-24 DIAGNOSIS — H6121 Impacted cerumen, right ear: Secondary | ICD-10-CM | POA: Diagnosis not present

## 2016-11-24 DIAGNOSIS — C50919 Malignant neoplasm of unspecified site of unspecified female breast: Secondary | ICD-10-CM | POA: Diagnosis not present

## 2016-11-24 DIAGNOSIS — E785 Hyperlipidemia, unspecified: Secondary | ICD-10-CM | POA: Diagnosis not present

## 2016-11-24 DIAGNOSIS — I4891 Unspecified atrial fibrillation: Secondary | ICD-10-CM | POA: Diagnosis not present

## 2016-12-02 DIAGNOSIS — M1612 Unilateral primary osteoarthritis, left hip: Secondary | ICD-10-CM | POA: Diagnosis not present

## 2016-12-02 DIAGNOSIS — M5416 Radiculopathy, lumbar region: Secondary | ICD-10-CM | POA: Diagnosis not present

## 2016-12-22 DIAGNOSIS — Z01818 Encounter for other preprocedural examination: Secondary | ICD-10-CM | POA: Diagnosis not present

## 2016-12-22 DIAGNOSIS — R52 Pain, unspecified: Secondary | ICD-10-CM | POA: Diagnosis not present

## 2016-12-22 DIAGNOSIS — J9811 Atelectasis: Secondary | ICD-10-CM | POA: Diagnosis not present

## 2016-12-22 DIAGNOSIS — M79609 Pain in unspecified limb: Secondary | ICD-10-CM | POA: Diagnosis not present

## 2016-12-22 DIAGNOSIS — Z79899 Other long term (current) drug therapy: Secondary | ICD-10-CM | POA: Diagnosis not present

## 2016-12-25 DIAGNOSIS — E119 Type 2 diabetes mellitus without complications: Secondary | ICD-10-CM | POA: Diagnosis not present

## 2016-12-25 DIAGNOSIS — E785 Hyperlipidemia, unspecified: Secondary | ICD-10-CM | POA: Diagnosis not present

## 2016-12-25 DIAGNOSIS — Z01818 Encounter for other preprocedural examination: Secondary | ICD-10-CM | POA: Diagnosis not present

## 2016-12-25 DIAGNOSIS — M545 Low back pain: Secondary | ICD-10-CM | POA: Diagnosis not present

## 2016-12-25 DIAGNOSIS — I10 Essential (primary) hypertension: Secondary | ICD-10-CM | POA: Diagnosis not present

## 2017-01-08 DIAGNOSIS — Z01810 Encounter for preprocedural cardiovascular examination: Secondary | ICD-10-CM | POA: Diagnosis not present

## 2017-01-08 DIAGNOSIS — I481 Persistent atrial fibrillation: Secondary | ICD-10-CM | POA: Diagnosis not present

## 2017-01-08 DIAGNOSIS — I5032 Chronic diastolic (congestive) heart failure: Secondary | ICD-10-CM | POA: Diagnosis not present

## 2017-01-08 DIAGNOSIS — I110 Hypertensive heart disease with heart failure: Secondary | ICD-10-CM | POA: Diagnosis not present

## 2017-01-08 DIAGNOSIS — Z7901 Long term (current) use of anticoagulants: Secondary | ICD-10-CM | POA: Diagnosis not present

## 2017-01-16 DIAGNOSIS — M79609 Pain in unspecified limb: Secondary | ICD-10-CM | POA: Diagnosis not present

## 2017-01-16 DIAGNOSIS — R52 Pain, unspecified: Secondary | ICD-10-CM | POA: Diagnosis not present

## 2017-01-16 DIAGNOSIS — Z01818 Encounter for other preprocedural examination: Secondary | ICD-10-CM | POA: Diagnosis not present

## 2017-01-16 DIAGNOSIS — Z79899 Other long term (current) drug therapy: Secondary | ICD-10-CM | POA: Diagnosis not present

## 2017-01-22 DIAGNOSIS — E119 Type 2 diabetes mellitus without complications: Secondary | ICD-10-CM | POA: Diagnosis not present

## 2017-02-03 DIAGNOSIS — M1612 Unilateral primary osteoarthritis, left hip: Secondary | ICD-10-CM | POA: Diagnosis not present

## 2017-02-11 DIAGNOSIS — Z888 Allergy status to other drugs, medicaments and biological substances status: Secondary | ICD-10-CM | POA: Diagnosis not present

## 2017-02-11 DIAGNOSIS — E119 Type 2 diabetes mellitus without complications: Secondary | ICD-10-CM | POA: Diagnosis not present

## 2017-02-11 DIAGNOSIS — Z471 Aftercare following joint replacement surgery: Secondary | ICD-10-CM | POA: Diagnosis not present

## 2017-02-11 DIAGNOSIS — F418 Other specified anxiety disorders: Secondary | ICD-10-CM | POA: Diagnosis not present

## 2017-02-11 DIAGNOSIS — Z853 Personal history of malignant neoplasm of breast: Secondary | ICD-10-CM | POA: Diagnosis not present

## 2017-02-11 DIAGNOSIS — I110 Hypertensive heart disease with heart failure: Secondary | ICD-10-CM | POA: Diagnosis not present

## 2017-02-11 DIAGNOSIS — Z79899 Other long term (current) drug therapy: Secondary | ICD-10-CM | POA: Diagnosis not present

## 2017-02-11 DIAGNOSIS — J45909 Unspecified asthma, uncomplicated: Secondary | ICD-10-CM | POA: Diagnosis not present

## 2017-02-11 DIAGNOSIS — I509 Heart failure, unspecified: Secondary | ICD-10-CM | POA: Diagnosis not present

## 2017-02-11 DIAGNOSIS — I2510 Atherosclerotic heart disease of native coronary artery without angina pectoris: Secondary | ICD-10-CM | POA: Diagnosis not present

## 2017-02-11 DIAGNOSIS — M1612 Unilateral primary osteoarthritis, left hip: Secondary | ICD-10-CM | POA: Diagnosis not present

## 2017-02-11 DIAGNOSIS — Z96641 Presence of right artificial hip joint: Secondary | ICD-10-CM | POA: Diagnosis not present

## 2017-02-15 DIAGNOSIS — J449 Chronic obstructive pulmonary disease, unspecified: Secondary | ICD-10-CM | POA: Diagnosis not present

## 2017-02-15 DIAGNOSIS — I50810 Right heart failure, unspecified: Secondary | ICD-10-CM | POA: Diagnosis not present

## 2017-02-15 DIAGNOSIS — C50919 Malignant neoplasm of unspecified site of unspecified female breast: Secondary | ICD-10-CM | POA: Diagnosis not present

## 2017-02-15 DIAGNOSIS — I110 Hypertensive heart disease with heart failure: Secondary | ICD-10-CM | POA: Diagnosis not present

## 2017-02-15 DIAGNOSIS — E119 Type 2 diabetes mellitus without complications: Secondary | ICD-10-CM | POA: Diagnosis not present

## 2017-02-15 DIAGNOSIS — I4891 Unspecified atrial fibrillation: Secondary | ICD-10-CM | POA: Diagnosis not present

## 2017-02-15 DIAGNOSIS — F329 Major depressive disorder, single episode, unspecified: Secondary | ICD-10-CM | POA: Diagnosis not present

## 2017-02-15 DIAGNOSIS — Z471 Aftercare following joint replacement surgery: Secondary | ICD-10-CM | POA: Diagnosis not present

## 2017-02-15 DIAGNOSIS — M545 Low back pain: Secondary | ICD-10-CM | POA: Diagnosis not present

## 2017-02-17 DIAGNOSIS — M79605 Pain in left leg: Secondary | ICD-10-CM | POA: Diagnosis not present

## 2017-02-17 DIAGNOSIS — R600 Localized edema: Secondary | ICD-10-CM | POA: Diagnosis not present

## 2017-02-17 DIAGNOSIS — M7989 Other specified soft tissue disorders: Secondary | ICD-10-CM | POA: Diagnosis not present

## 2017-02-18 DIAGNOSIS — M545 Low back pain: Secondary | ICD-10-CM | POA: Diagnosis not present

## 2017-02-18 DIAGNOSIS — C50919 Malignant neoplasm of unspecified site of unspecified female breast: Secondary | ICD-10-CM | POA: Diagnosis not present

## 2017-02-18 DIAGNOSIS — Z471 Aftercare following joint replacement surgery: Secondary | ICD-10-CM | POA: Diagnosis not present

## 2017-02-18 DIAGNOSIS — I50810 Right heart failure, unspecified: Secondary | ICD-10-CM | POA: Diagnosis not present

## 2017-02-18 DIAGNOSIS — J449 Chronic obstructive pulmonary disease, unspecified: Secondary | ICD-10-CM | POA: Diagnosis not present

## 2017-02-18 DIAGNOSIS — E119 Type 2 diabetes mellitus without complications: Secondary | ICD-10-CM | POA: Diagnosis not present

## 2017-02-18 DIAGNOSIS — F329 Major depressive disorder, single episode, unspecified: Secondary | ICD-10-CM | POA: Diagnosis not present

## 2017-02-18 DIAGNOSIS — I4891 Unspecified atrial fibrillation: Secondary | ICD-10-CM | POA: Diagnosis not present

## 2017-02-18 DIAGNOSIS — I110 Hypertensive heart disease with heart failure: Secondary | ICD-10-CM | POA: Diagnosis not present

## 2017-02-20 DIAGNOSIS — C50919 Malignant neoplasm of unspecified site of unspecified female breast: Secondary | ICD-10-CM | POA: Diagnosis not present

## 2017-02-20 DIAGNOSIS — I50810 Right heart failure, unspecified: Secondary | ICD-10-CM | POA: Diagnosis not present

## 2017-02-20 DIAGNOSIS — I10 Essential (primary) hypertension: Secondary | ICD-10-CM | POA: Diagnosis not present

## 2017-02-20 DIAGNOSIS — J449 Chronic obstructive pulmonary disease, unspecified: Secondary | ICD-10-CM | POA: Diagnosis not present

## 2017-02-20 DIAGNOSIS — M545 Low back pain: Secondary | ICD-10-CM | POA: Diagnosis not present

## 2017-02-20 DIAGNOSIS — Z471 Aftercare following joint replacement surgery: Secondary | ICD-10-CM | POA: Diagnosis not present

## 2017-02-20 DIAGNOSIS — R079 Chest pain, unspecified: Secondary | ICD-10-CM | POA: Diagnosis not present

## 2017-02-20 DIAGNOSIS — F329 Major depressive disorder, single episode, unspecified: Secondary | ICD-10-CM | POA: Diagnosis not present

## 2017-02-20 DIAGNOSIS — I110 Hypertensive heart disease with heart failure: Secondary | ICD-10-CM | POA: Diagnosis not present

## 2017-02-20 DIAGNOSIS — I4891 Unspecified atrial fibrillation: Secondary | ICD-10-CM | POA: Diagnosis not present

## 2017-02-20 DIAGNOSIS — R609 Edema, unspecified: Secondary | ICD-10-CM | POA: Diagnosis not present

## 2017-02-20 DIAGNOSIS — M79662 Pain in left lower leg: Secondary | ICD-10-CM | POA: Diagnosis not present

## 2017-02-20 DIAGNOSIS — R600 Localized edema: Secondary | ICD-10-CM | POA: Diagnosis not present

## 2017-02-20 DIAGNOSIS — Z9889 Other specified postprocedural states: Secondary | ICD-10-CM | POA: Diagnosis not present

## 2017-02-20 DIAGNOSIS — E119 Type 2 diabetes mellitus without complications: Secondary | ICD-10-CM | POA: Diagnosis not present

## 2017-02-23 DIAGNOSIS — C50919 Malignant neoplasm of unspecified site of unspecified female breast: Secondary | ICD-10-CM | POA: Diagnosis not present

## 2017-02-23 DIAGNOSIS — J449 Chronic obstructive pulmonary disease, unspecified: Secondary | ICD-10-CM | POA: Diagnosis not present

## 2017-02-23 DIAGNOSIS — I4891 Unspecified atrial fibrillation: Secondary | ICD-10-CM | POA: Diagnosis not present

## 2017-02-23 DIAGNOSIS — E119 Type 2 diabetes mellitus without complications: Secondary | ICD-10-CM | POA: Diagnosis not present

## 2017-02-23 DIAGNOSIS — I50810 Right heart failure, unspecified: Secondary | ICD-10-CM | POA: Diagnosis not present

## 2017-02-23 DIAGNOSIS — Z471 Aftercare following joint replacement surgery: Secondary | ICD-10-CM | POA: Diagnosis not present

## 2017-02-23 DIAGNOSIS — F329 Major depressive disorder, single episode, unspecified: Secondary | ICD-10-CM | POA: Diagnosis not present

## 2017-02-23 DIAGNOSIS — I110 Hypertensive heart disease with heart failure: Secondary | ICD-10-CM | POA: Diagnosis not present

## 2017-02-23 DIAGNOSIS — M545 Low back pain: Secondary | ICD-10-CM | POA: Diagnosis not present

## 2017-02-24 DIAGNOSIS — F329 Major depressive disorder, single episode, unspecified: Secondary | ICD-10-CM | POA: Diagnosis not present

## 2017-02-24 DIAGNOSIS — Z79899 Other long term (current) drug therapy: Secondary | ICD-10-CM | POA: Diagnosis not present

## 2017-02-24 DIAGNOSIS — B372 Candidiasis of skin and nail: Secondary | ICD-10-CM | POA: Diagnosis not present

## 2017-02-24 DIAGNOSIS — E538 Deficiency of other specified B group vitamins: Secondary | ICD-10-CM | POA: Diagnosis not present

## 2017-02-24 DIAGNOSIS — J449 Chronic obstructive pulmonary disease, unspecified: Secondary | ICD-10-CM | POA: Diagnosis not present

## 2017-02-24 DIAGNOSIS — I4891 Unspecified atrial fibrillation: Secondary | ICD-10-CM | POA: Diagnosis not present

## 2017-02-24 DIAGNOSIS — E785 Hyperlipidemia, unspecified: Secondary | ICD-10-CM | POA: Diagnosis not present

## 2017-02-24 DIAGNOSIS — I50810 Right heart failure, unspecified: Secondary | ICD-10-CM | POA: Diagnosis not present

## 2017-02-24 DIAGNOSIS — D649 Anemia, unspecified: Secondary | ICD-10-CM | POA: Diagnosis not present

## 2017-02-24 DIAGNOSIS — C50919 Malignant neoplasm of unspecified site of unspecified female breast: Secondary | ICD-10-CM | POA: Diagnosis not present

## 2017-02-24 DIAGNOSIS — E119 Type 2 diabetes mellitus without complications: Secondary | ICD-10-CM | POA: Diagnosis not present

## 2017-02-24 DIAGNOSIS — E559 Vitamin D deficiency, unspecified: Secondary | ICD-10-CM | POA: Diagnosis not present

## 2017-02-25 DIAGNOSIS — I110 Hypertensive heart disease with heart failure: Secondary | ICD-10-CM | POA: Diagnosis not present

## 2017-02-25 DIAGNOSIS — J449 Chronic obstructive pulmonary disease, unspecified: Secondary | ICD-10-CM | POA: Diagnosis not present

## 2017-02-25 DIAGNOSIS — I50810 Right heart failure, unspecified: Secondary | ICD-10-CM | POA: Diagnosis not present

## 2017-02-25 DIAGNOSIS — Z471 Aftercare following joint replacement surgery: Secondary | ICD-10-CM | POA: Diagnosis not present

## 2017-02-25 DIAGNOSIS — E119 Type 2 diabetes mellitus without complications: Secondary | ICD-10-CM | POA: Diagnosis not present

## 2017-02-25 DIAGNOSIS — C50919 Malignant neoplasm of unspecified site of unspecified female breast: Secondary | ICD-10-CM | POA: Diagnosis not present

## 2017-02-25 DIAGNOSIS — F329 Major depressive disorder, single episode, unspecified: Secondary | ICD-10-CM | POA: Diagnosis not present

## 2017-02-25 DIAGNOSIS — M545 Low back pain: Secondary | ICD-10-CM | POA: Diagnosis not present

## 2017-02-25 DIAGNOSIS — I4891 Unspecified atrial fibrillation: Secondary | ICD-10-CM | POA: Diagnosis not present

## 2017-02-26 DIAGNOSIS — I110 Hypertensive heart disease with heart failure: Secondary | ICD-10-CM | POA: Diagnosis not present

## 2017-02-26 DIAGNOSIS — I50810 Right heart failure, unspecified: Secondary | ICD-10-CM | POA: Diagnosis not present

## 2017-02-26 DIAGNOSIS — Z471 Aftercare following joint replacement surgery: Secondary | ICD-10-CM | POA: Diagnosis not present

## 2017-02-26 DIAGNOSIS — E119 Type 2 diabetes mellitus without complications: Secondary | ICD-10-CM | POA: Diagnosis not present

## 2017-02-26 DIAGNOSIS — C50919 Malignant neoplasm of unspecified site of unspecified female breast: Secondary | ICD-10-CM | POA: Diagnosis not present

## 2017-02-26 DIAGNOSIS — M545 Low back pain: Secondary | ICD-10-CM | POA: Diagnosis not present

## 2017-02-26 DIAGNOSIS — F329 Major depressive disorder, single episode, unspecified: Secondary | ICD-10-CM | POA: Diagnosis not present

## 2017-02-26 DIAGNOSIS — I4891 Unspecified atrial fibrillation: Secondary | ICD-10-CM | POA: Diagnosis not present

## 2017-02-26 DIAGNOSIS — J449 Chronic obstructive pulmonary disease, unspecified: Secondary | ICD-10-CM | POA: Diagnosis not present

## 2017-03-06 DIAGNOSIS — R2689 Other abnormalities of gait and mobility: Secondary | ICD-10-CM | POA: Diagnosis not present

## 2017-03-06 DIAGNOSIS — M25552 Pain in left hip: Secondary | ICD-10-CM | POA: Diagnosis not present

## 2017-03-06 DIAGNOSIS — M6281 Muscle weakness (generalized): Secondary | ICD-10-CM | POA: Diagnosis not present

## 2017-03-06 DIAGNOSIS — M1612 Unilateral primary osteoarthritis, left hip: Secondary | ICD-10-CM | POA: Diagnosis not present

## 2017-03-10 DIAGNOSIS — M1612 Unilateral primary osteoarthritis, left hip: Secondary | ICD-10-CM | POA: Diagnosis not present

## 2017-03-10 DIAGNOSIS — M25552 Pain in left hip: Secondary | ICD-10-CM | POA: Diagnosis not present

## 2017-03-10 DIAGNOSIS — R2689 Other abnormalities of gait and mobility: Secondary | ICD-10-CM | POA: Diagnosis not present

## 2017-03-10 DIAGNOSIS — M6281 Muscle weakness (generalized): Secondary | ICD-10-CM | POA: Diagnosis not present

## 2017-03-11 DIAGNOSIS — I10 Essential (primary) hypertension: Secondary | ICD-10-CM | POA: Diagnosis not present

## 2017-03-11 DIAGNOSIS — R32 Unspecified urinary incontinence: Secondary | ICD-10-CM | POA: Diagnosis not present

## 2017-03-11 DIAGNOSIS — J449 Chronic obstructive pulmonary disease, unspecified: Secondary | ICD-10-CM | POA: Diagnosis not present

## 2017-03-11 DIAGNOSIS — Z6829 Body mass index (BMI) 29.0-29.9, adult: Secondary | ICD-10-CM | POA: Diagnosis not present

## 2017-03-11 DIAGNOSIS — I4891 Unspecified atrial fibrillation: Secondary | ICD-10-CM | POA: Diagnosis not present

## 2017-03-11 DIAGNOSIS — I50810 Right heart failure, unspecified: Secondary | ICD-10-CM | POA: Diagnosis not present

## 2017-03-11 DIAGNOSIS — D649 Anemia, unspecified: Secondary | ICD-10-CM | POA: Diagnosis not present

## 2017-03-11 DIAGNOSIS — E785 Hyperlipidemia, unspecified: Secondary | ICD-10-CM | POA: Diagnosis not present

## 2017-03-11 DIAGNOSIS — E119 Type 2 diabetes mellitus without complications: Secondary | ICD-10-CM | POA: Diagnosis not present

## 2017-03-13 DIAGNOSIS — M6281 Muscle weakness (generalized): Secondary | ICD-10-CM | POA: Diagnosis not present

## 2017-03-13 DIAGNOSIS — M25552 Pain in left hip: Secondary | ICD-10-CM | POA: Diagnosis not present

## 2017-03-13 DIAGNOSIS — R2689 Other abnormalities of gait and mobility: Secondary | ICD-10-CM | POA: Diagnosis not present

## 2017-03-13 DIAGNOSIS — M1612 Unilateral primary osteoarthritis, left hip: Secondary | ICD-10-CM | POA: Diagnosis not present

## 2017-03-17 DIAGNOSIS — M1612 Unilateral primary osteoarthritis, left hip: Secondary | ICD-10-CM | POA: Diagnosis not present

## 2017-03-17 DIAGNOSIS — R2689 Other abnormalities of gait and mobility: Secondary | ICD-10-CM | POA: Diagnosis not present

## 2017-03-17 DIAGNOSIS — M25552 Pain in left hip: Secondary | ICD-10-CM | POA: Diagnosis not present

## 2017-03-17 DIAGNOSIS — M6281 Muscle weakness (generalized): Secondary | ICD-10-CM | POA: Diagnosis not present

## 2017-03-18 DIAGNOSIS — D649 Anemia, unspecified: Secondary | ICD-10-CM | POA: Diagnosis not present

## 2017-03-20 DIAGNOSIS — M7989 Other specified soft tissue disorders: Secondary | ICD-10-CM | POA: Diagnosis not present

## 2017-03-20 DIAGNOSIS — M79662 Pain in left lower leg: Secondary | ICD-10-CM | POA: Diagnosis not present

## 2017-03-20 DIAGNOSIS — M6281 Muscle weakness (generalized): Secondary | ICD-10-CM | POA: Diagnosis not present

## 2017-03-20 DIAGNOSIS — M25552 Pain in left hip: Secondary | ICD-10-CM | POA: Diagnosis not present

## 2017-03-20 DIAGNOSIS — R609 Edema, unspecified: Secondary | ICD-10-CM | POA: Diagnosis not present

## 2017-03-20 DIAGNOSIS — R2689 Other abnormalities of gait and mobility: Secondary | ICD-10-CM | POA: Diagnosis not present

## 2017-03-20 DIAGNOSIS — M1612 Unilateral primary osteoarthritis, left hip: Secondary | ICD-10-CM | POA: Diagnosis not present

## 2017-03-20 DIAGNOSIS — M79661 Pain in right lower leg: Secondary | ICD-10-CM | POA: Diagnosis not present

## 2017-03-24 DIAGNOSIS — M79604 Pain in right leg: Secondary | ICD-10-CM | POA: Diagnosis not present

## 2017-03-24 DIAGNOSIS — Z96642 Presence of left artificial hip joint: Secondary | ICD-10-CM | POA: Diagnosis not present

## 2017-03-27 DIAGNOSIS — M7121 Synovial cyst of popliteal space [Baker], right knee: Secondary | ICD-10-CM | POA: Diagnosis not present

## 2017-03-27 DIAGNOSIS — M25561 Pain in right knee: Secondary | ICD-10-CM | POA: Diagnosis not present

## 2017-03-27 DIAGNOSIS — M79604 Pain in right leg: Secondary | ICD-10-CM | POA: Diagnosis not present

## 2017-03-27 DIAGNOSIS — R600 Localized edema: Secondary | ICD-10-CM | POA: Diagnosis not present

## 2017-03-30 DIAGNOSIS — M1711 Unilateral primary osteoarthritis, right knee: Secondary | ICD-10-CM | POA: Diagnosis not present

## 2017-03-30 DIAGNOSIS — M7121 Synovial cyst of popliteal space [Baker], right knee: Secondary | ICD-10-CM | POA: Diagnosis not present

## 2017-04-01 DIAGNOSIS — R928 Other abnormal and inconclusive findings on diagnostic imaging of breast: Secondary | ICD-10-CM | POA: Diagnosis not present

## 2017-04-07 DIAGNOSIS — C50112 Malignant neoplasm of central portion of left female breast: Secondary | ICD-10-CM | POA: Diagnosis not present

## 2017-04-07 DIAGNOSIS — Z170 Estrogen receptor positive status [ER+]: Secondary | ICD-10-CM | POA: Diagnosis not present

## 2017-04-13 DIAGNOSIS — M79604 Pain in right leg: Secondary | ICD-10-CM | POA: Diagnosis not present

## 2017-04-13 DIAGNOSIS — Z96641 Presence of right artificial hip joint: Secondary | ICD-10-CM | POA: Diagnosis not present

## 2017-04-13 DIAGNOSIS — M1612 Unilateral primary osteoarthritis, left hip: Secondary | ICD-10-CM | POA: Diagnosis not present

## 2017-04-14 DIAGNOSIS — M1711 Unilateral primary osteoarthritis, right knee: Secondary | ICD-10-CM | POA: Diagnosis not present

## 2017-05-18 DIAGNOSIS — Z853 Personal history of malignant neoplasm of breast: Secondary | ICD-10-CM | POA: Diagnosis not present

## 2017-05-18 DIAGNOSIS — M25561 Pain in right knee: Secondary | ICD-10-CM | POA: Diagnosis not present

## 2017-05-18 DIAGNOSIS — Z96642 Presence of left artificial hip joint: Secondary | ICD-10-CM | POA: Diagnosis not present

## 2017-05-18 DIAGNOSIS — Z79811 Long term (current) use of aromatase inhibitors: Secondary | ICD-10-CM | POA: Diagnosis not present

## 2017-05-19 DIAGNOSIS — M79661 Pain in right lower leg: Secondary | ICD-10-CM | POA: Diagnosis not present

## 2017-05-19 DIAGNOSIS — M25561 Pain in right knee: Secondary | ICD-10-CM | POA: Diagnosis not present

## 2017-05-27 DIAGNOSIS — C50919 Malignant neoplasm of unspecified site of unspecified female breast: Secondary | ICD-10-CM | POA: Diagnosis not present

## 2017-05-27 DIAGNOSIS — Z79899 Other long term (current) drug therapy: Secondary | ICD-10-CM | POA: Diagnosis not present

## 2017-05-27 DIAGNOSIS — Z1389 Encounter for screening for other disorder: Secondary | ICD-10-CM | POA: Diagnosis not present

## 2017-05-27 DIAGNOSIS — I4891 Unspecified atrial fibrillation: Secondary | ICD-10-CM | POA: Diagnosis not present

## 2017-05-27 DIAGNOSIS — I50810 Right heart failure, unspecified: Secondary | ICD-10-CM | POA: Diagnosis not present

## 2017-05-27 DIAGNOSIS — I10 Essential (primary) hypertension: Secondary | ICD-10-CM | POA: Diagnosis not present

## 2017-05-27 DIAGNOSIS — Z6828 Body mass index (BMI) 28.0-28.9, adult: Secondary | ICD-10-CM | POA: Diagnosis not present

## 2017-05-27 DIAGNOSIS — E119 Type 2 diabetes mellitus without complications: Secondary | ICD-10-CM | POA: Diagnosis not present

## 2017-05-27 DIAGNOSIS — R32 Unspecified urinary incontinence: Secondary | ICD-10-CM | POA: Diagnosis not present

## 2017-05-27 DIAGNOSIS — E785 Hyperlipidemia, unspecified: Secondary | ICD-10-CM | POA: Diagnosis not present

## 2017-05-27 DIAGNOSIS — E559 Vitamin D deficiency, unspecified: Secondary | ICD-10-CM | POA: Diagnosis not present

## 2017-08-03 DIAGNOSIS — Z23 Encounter for immunization: Secondary | ICD-10-CM | POA: Diagnosis not present

## 2017-08-18 DIAGNOSIS — M25561 Pain in right knee: Secondary | ICD-10-CM | POA: Diagnosis not present

## 2017-08-22 DIAGNOSIS — F329 Major depressive disorder, single episode, unspecified: Secondary | ICD-10-CM | POA: Diagnosis not present

## 2017-08-22 DIAGNOSIS — I4891 Unspecified atrial fibrillation: Secondary | ICD-10-CM | POA: Diagnosis not present

## 2017-08-22 DIAGNOSIS — I509 Heart failure, unspecified: Secondary | ICD-10-CM | POA: Diagnosis not present

## 2017-08-22 DIAGNOSIS — E663 Overweight: Secondary | ICD-10-CM | POA: Diagnosis not present

## 2017-08-22 DIAGNOSIS — D649 Anemia, unspecified: Secondary | ICD-10-CM | POA: Diagnosis not present

## 2017-08-22 DIAGNOSIS — I110 Hypertensive heart disease with heart failure: Secondary | ICD-10-CM | POA: Diagnosis not present

## 2017-08-22 DIAGNOSIS — E785 Hyperlipidemia, unspecified: Secondary | ICD-10-CM | POA: Diagnosis not present

## 2017-08-22 DIAGNOSIS — E119 Type 2 diabetes mellitus without complications: Secondary | ICD-10-CM | POA: Diagnosis not present

## 2017-08-22 DIAGNOSIS — J449 Chronic obstructive pulmonary disease, unspecified: Secondary | ICD-10-CM | POA: Diagnosis not present

## 2017-09-03 DIAGNOSIS — F329 Major depressive disorder, single episode, unspecified: Secondary | ICD-10-CM | POA: Diagnosis not present

## 2017-09-03 DIAGNOSIS — D649 Anemia, unspecified: Secondary | ICD-10-CM | POA: Diagnosis not present

## 2017-09-03 DIAGNOSIS — E119 Type 2 diabetes mellitus without complications: Secondary | ICD-10-CM | POA: Diagnosis not present

## 2017-09-03 DIAGNOSIS — I10 Essential (primary) hypertension: Secondary | ICD-10-CM | POA: Diagnosis not present

## 2017-09-03 DIAGNOSIS — I4891 Unspecified atrial fibrillation: Secondary | ICD-10-CM | POA: Diagnosis not present

## 2017-09-03 DIAGNOSIS — E785 Hyperlipidemia, unspecified: Secondary | ICD-10-CM | POA: Diagnosis not present

## 2017-09-03 DIAGNOSIS — Z23 Encounter for immunization: Secondary | ICD-10-CM | POA: Diagnosis not present

## 2017-09-03 DIAGNOSIS — I50810 Right heart failure, unspecified: Secondary | ICD-10-CM | POA: Diagnosis not present

## 2017-09-03 DIAGNOSIS — E559 Vitamin D deficiency, unspecified: Secondary | ICD-10-CM | POA: Diagnosis not present

## 2017-09-03 DIAGNOSIS — J449 Chronic obstructive pulmonary disease, unspecified: Secondary | ICD-10-CM | POA: Diagnosis not present

## 2017-10-05 DIAGNOSIS — D649 Anemia, unspecified: Secondary | ICD-10-CM | POA: Diagnosis not present

## 2017-10-05 DIAGNOSIS — J449 Chronic obstructive pulmonary disease, unspecified: Secondary | ICD-10-CM | POA: Diagnosis not present

## 2017-10-05 DIAGNOSIS — C50919 Malignant neoplasm of unspecified site of unspecified female breast: Secondary | ICD-10-CM | POA: Diagnosis not present

## 2017-10-05 DIAGNOSIS — E663 Overweight: Secondary | ICD-10-CM | POA: Diagnosis not present

## 2017-10-05 DIAGNOSIS — E785 Hyperlipidemia, unspecified: Secondary | ICD-10-CM | POA: Diagnosis not present

## 2017-10-05 DIAGNOSIS — R32 Unspecified urinary incontinence: Secondary | ICD-10-CM | POA: Diagnosis not present

## 2017-10-05 DIAGNOSIS — E119 Type 2 diabetes mellitus without complications: Secondary | ICD-10-CM | POA: Diagnosis not present

## 2017-10-05 DIAGNOSIS — E559 Vitamin D deficiency, unspecified: Secondary | ICD-10-CM | POA: Diagnosis not present

## 2017-10-05 DIAGNOSIS — J189 Pneumonia, unspecified organism: Secondary | ICD-10-CM | POA: Diagnosis not present

## 2017-11-10 DIAGNOSIS — E785 Hyperlipidemia, unspecified: Secondary | ICD-10-CM | POA: Diagnosis not present

## 2017-11-10 DIAGNOSIS — R42 Dizziness and giddiness: Secondary | ICD-10-CM | POA: Diagnosis not present

## 2017-11-10 DIAGNOSIS — R29701 NIHSS score 1: Secondary | ICD-10-CM | POA: Diagnosis not present

## 2017-11-10 DIAGNOSIS — R404 Transient alteration of awareness: Secondary | ICD-10-CM | POA: Diagnosis not present

## 2017-11-10 DIAGNOSIS — E119 Type 2 diabetes mellitus without complications: Secondary | ICD-10-CM | POA: Diagnosis not present

## 2017-11-10 DIAGNOSIS — R110 Nausea: Secondary | ICD-10-CM | POA: Diagnosis not present

## 2017-11-10 DIAGNOSIS — Z8679 Personal history of other diseases of the circulatory system: Secondary | ICD-10-CM | POA: Diagnosis not present

## 2017-11-10 DIAGNOSIS — Z853 Personal history of malignant neoplasm of breast: Secondary | ICD-10-CM | POA: Diagnosis not present

## 2017-11-10 DIAGNOSIS — R001 Bradycardia, unspecified: Secondary | ICD-10-CM | POA: Diagnosis not present

## 2017-11-10 DIAGNOSIS — I10 Essential (primary) hypertension: Secondary | ICD-10-CM | POA: Diagnosis not present

## 2017-11-10 DIAGNOSIS — R0600 Dyspnea, unspecified: Secondary | ICD-10-CM | POA: Diagnosis not present

## 2017-11-10 DIAGNOSIS — R296 Repeated falls: Secondary | ICD-10-CM | POA: Diagnosis not present

## 2017-11-10 DIAGNOSIS — I4891 Unspecified atrial fibrillation: Secondary | ICD-10-CM | POA: Diagnosis not present

## 2017-11-10 DIAGNOSIS — R55 Syncope and collapse: Secondary | ICD-10-CM | POA: Diagnosis not present

## 2017-11-10 DIAGNOSIS — I110 Hypertensive heart disease with heart failure: Secondary | ICD-10-CM | POA: Diagnosis not present

## 2017-11-10 DIAGNOSIS — R270 Ataxia, unspecified: Secondary | ICD-10-CM | POA: Diagnosis not present

## 2017-11-10 DIAGNOSIS — I509 Heart failure, unspecified: Secondary | ICD-10-CM | POA: Diagnosis not present

## 2017-11-11 DIAGNOSIS — I63233 Cerebral infarction due to unspecified occlusion or stenosis of bilateral carotid arteries: Secondary | ICD-10-CM | POA: Diagnosis not present

## 2017-11-11 DIAGNOSIS — I6789 Other cerebrovascular disease: Secondary | ICD-10-CM | POA: Diagnosis not present

## 2017-11-11 DIAGNOSIS — R42 Dizziness and giddiness: Secondary | ICD-10-CM | POA: Diagnosis not present

## 2017-11-12 DIAGNOSIS — E119 Type 2 diabetes mellitus without complications: Secondary | ICD-10-CM | POA: Diagnosis not present

## 2017-11-12 DIAGNOSIS — I4891 Unspecified atrial fibrillation: Secondary | ICD-10-CM | POA: Diagnosis not present

## 2017-11-12 DIAGNOSIS — E785 Hyperlipidemia, unspecified: Secondary | ICD-10-CM | POA: Diagnosis not present

## 2017-11-12 DIAGNOSIS — Z8679 Personal history of other diseases of the circulatory system: Secondary | ICD-10-CM | POA: Diagnosis not present

## 2017-11-12 DIAGNOSIS — I10 Essential (primary) hypertension: Secondary | ICD-10-CM | POA: Diagnosis not present

## 2017-11-12 DIAGNOSIS — Z853 Personal history of malignant neoplasm of breast: Secondary | ICD-10-CM | POA: Diagnosis not present

## 2017-11-12 DIAGNOSIS — R55 Syncope and collapse: Secondary | ICD-10-CM | POA: Diagnosis not present

## 2017-11-17 DIAGNOSIS — E119 Type 2 diabetes mellitus without complications: Secondary | ICD-10-CM | POA: Diagnosis not present

## 2017-11-17 DIAGNOSIS — I481 Persistent atrial fibrillation: Secondary | ICD-10-CM | POA: Diagnosis not present

## 2017-11-17 DIAGNOSIS — E785 Hyperlipidemia, unspecified: Secondary | ICD-10-CM | POA: Diagnosis not present

## 2017-11-17 DIAGNOSIS — Z7901 Long term (current) use of anticoagulants: Secondary | ICD-10-CM | POA: Diagnosis not present

## 2017-11-17 DIAGNOSIS — I10 Essential (primary) hypertension: Secondary | ICD-10-CM | POA: Diagnosis not present

## 2017-11-18 DIAGNOSIS — C50912 Malignant neoplasm of unspecified site of left female breast: Secondary | ICD-10-CM | POA: Diagnosis not present

## 2017-11-18 DIAGNOSIS — Z853 Personal history of malignant neoplasm of breast: Secondary | ICD-10-CM | POA: Diagnosis not present

## 2017-11-18 DIAGNOSIS — Z79811 Long term (current) use of aromatase inhibitors: Secondary | ICD-10-CM | POA: Diagnosis not present

## 2017-11-19 DIAGNOSIS — Z79899 Other long term (current) drug therapy: Secondary | ICD-10-CM | POA: Diagnosis not present

## 2017-11-19 DIAGNOSIS — F329 Major depressive disorder, single episode, unspecified: Secondary | ICD-10-CM | POA: Diagnosis not present

## 2017-11-19 DIAGNOSIS — Z9181 History of falling: Secondary | ICD-10-CM | POA: Diagnosis not present

## 2017-11-19 DIAGNOSIS — E559 Vitamin D deficiency, unspecified: Secondary | ICD-10-CM | POA: Diagnosis not present

## 2017-11-19 DIAGNOSIS — I4891 Unspecified atrial fibrillation: Secondary | ICD-10-CM | POA: Diagnosis not present

## 2017-11-19 DIAGNOSIS — D649 Anemia, unspecified: Secondary | ICD-10-CM | POA: Diagnosis not present

## 2017-11-19 DIAGNOSIS — E669 Obesity, unspecified: Secondary | ICD-10-CM | POA: Diagnosis not present

## 2017-11-19 DIAGNOSIS — Z96642 Presence of left artificial hip joint: Secondary | ICD-10-CM | POA: Diagnosis not present

## 2017-11-19 DIAGNOSIS — I10 Essential (primary) hypertension: Secondary | ICD-10-CM | POA: Diagnosis not present

## 2017-11-19 DIAGNOSIS — I50810 Right heart failure, unspecified: Secondary | ICD-10-CM | POA: Diagnosis not present

## 2017-11-19 DIAGNOSIS — M1612 Unilateral primary osteoarthritis, left hip: Secondary | ICD-10-CM | POA: Diagnosis not present

## 2018-01-12 DIAGNOSIS — C50919 Malignant neoplasm of unspecified site of unspecified female breast: Secondary | ICD-10-CM | POA: Diagnosis not present

## 2018-01-12 DIAGNOSIS — E559 Vitamin D deficiency, unspecified: Secondary | ICD-10-CM | POA: Diagnosis not present

## 2018-01-12 DIAGNOSIS — J449 Chronic obstructive pulmonary disease, unspecified: Secondary | ICD-10-CM | POA: Diagnosis not present

## 2018-01-12 DIAGNOSIS — Z79899 Other long term (current) drug therapy: Secondary | ICD-10-CM | POA: Diagnosis not present

## 2018-01-12 DIAGNOSIS — I50810 Right heart failure, unspecified: Secondary | ICD-10-CM | POA: Diagnosis not present

## 2018-01-12 DIAGNOSIS — E785 Hyperlipidemia, unspecified: Secondary | ICD-10-CM | POA: Diagnosis not present

## 2018-01-12 DIAGNOSIS — E118 Type 2 diabetes mellitus with unspecified complications: Secondary | ICD-10-CM | POA: Diagnosis not present

## 2018-01-12 DIAGNOSIS — I10 Essential (primary) hypertension: Secondary | ICD-10-CM | POA: Diagnosis not present

## 2018-01-12 DIAGNOSIS — D649 Anemia, unspecified: Secondary | ICD-10-CM | POA: Diagnosis not present

## 2018-02-16 DIAGNOSIS — Z96642 Presence of left artificial hip joint: Secondary | ICD-10-CM | POA: Diagnosis not present

## 2018-02-16 DIAGNOSIS — M1612 Unilateral primary osteoarthritis, left hip: Secondary | ICD-10-CM | POA: Diagnosis not present

## 2018-03-02 DIAGNOSIS — Z7984 Long term (current) use of oral hypoglycemic drugs: Secondary | ICD-10-CM | POA: Diagnosis not present

## 2018-03-02 DIAGNOSIS — H25813 Combined forms of age-related cataract, bilateral: Secondary | ICD-10-CM | POA: Diagnosis not present

## 2018-03-02 DIAGNOSIS — H524 Presbyopia: Secondary | ICD-10-CM | POA: Diagnosis not present

## 2018-03-02 DIAGNOSIS — E113293 Type 2 diabetes mellitus with mild nonproliferative diabetic retinopathy without macular edema, bilateral: Secondary | ICD-10-CM | POA: Diagnosis not present

## 2018-03-02 DIAGNOSIS — I10 Essential (primary) hypertension: Secondary | ICD-10-CM | POA: Diagnosis not present

## 2018-03-02 DIAGNOSIS — H35043 Retinal micro-aneurysms, unspecified, bilateral: Secondary | ICD-10-CM | POA: Diagnosis not present

## 2018-03-02 DIAGNOSIS — H3563 Retinal hemorrhage, bilateral: Secondary | ICD-10-CM | POA: Diagnosis not present

## 2018-03-02 DIAGNOSIS — H5203 Hypermetropia, bilateral: Secondary | ICD-10-CM | POA: Diagnosis not present

## 2018-03-02 DIAGNOSIS — H52223 Regular astigmatism, bilateral: Secondary | ICD-10-CM | POA: Diagnosis not present

## 2018-04-06 DIAGNOSIS — Z01818 Encounter for other preprocedural examination: Secondary | ICD-10-CM | POA: Diagnosis not present

## 2018-04-06 DIAGNOSIS — H2511 Age-related nuclear cataract, right eye: Secondary | ICD-10-CM | POA: Diagnosis not present

## 2018-04-13 DIAGNOSIS — I4891 Unspecified atrial fibrillation: Secondary | ICD-10-CM | POA: Diagnosis not present

## 2018-04-13 DIAGNOSIS — J449 Chronic obstructive pulmonary disease, unspecified: Secondary | ICD-10-CM | POA: Diagnosis not present

## 2018-04-13 DIAGNOSIS — C50919 Malignant neoplasm of unspecified site of unspecified female breast: Secondary | ICD-10-CM | POA: Diagnosis not present

## 2018-04-13 DIAGNOSIS — I50810 Right heart failure, unspecified: Secondary | ICD-10-CM | POA: Diagnosis not present

## 2018-04-13 DIAGNOSIS — E559 Vitamin D deficiency, unspecified: Secondary | ICD-10-CM | POA: Diagnosis not present

## 2018-04-13 DIAGNOSIS — E785 Hyperlipidemia, unspecified: Secondary | ICD-10-CM | POA: Diagnosis not present

## 2018-04-13 DIAGNOSIS — Z1339 Encounter for screening examination for other mental health and behavioral disorders: Secondary | ICD-10-CM | POA: Diagnosis not present

## 2018-04-13 DIAGNOSIS — E118 Type 2 diabetes mellitus with unspecified complications: Secondary | ICD-10-CM | POA: Diagnosis not present

## 2018-04-13 DIAGNOSIS — R0602 Shortness of breath: Secondary | ICD-10-CM | POA: Diagnosis not present

## 2018-04-13 DIAGNOSIS — D649 Anemia, unspecified: Secondary | ICD-10-CM | POA: Diagnosis not present

## 2018-04-22 DIAGNOSIS — R928 Other abnormal and inconclusive findings on diagnostic imaging of breast: Secondary | ICD-10-CM | POA: Diagnosis not present

## 2018-04-22 DIAGNOSIS — Z853 Personal history of malignant neoplasm of breast: Secondary | ICD-10-CM | POA: Diagnosis not present

## 2018-04-26 DIAGNOSIS — C50112 Malignant neoplasm of central portion of left female breast: Secondary | ICD-10-CM | POA: Diagnosis not present

## 2018-04-26 DIAGNOSIS — Z170 Estrogen receptor positive status [ER+]: Secondary | ICD-10-CM | POA: Diagnosis not present

## 2018-05-04 DIAGNOSIS — M1990 Unspecified osteoarthritis, unspecified site: Secondary | ICD-10-CM | POA: Diagnosis not present

## 2018-05-04 DIAGNOSIS — H524 Presbyopia: Secondary | ICD-10-CM | POA: Diagnosis not present

## 2018-05-04 DIAGNOSIS — D649 Anemia, unspecified: Secondary | ICD-10-CM | POA: Diagnosis not present

## 2018-05-04 DIAGNOSIS — E119 Type 2 diabetes mellitus without complications: Secondary | ICD-10-CM | POA: Diagnosis not present

## 2018-05-04 DIAGNOSIS — H5203 Hypermetropia, bilateral: Secondary | ICD-10-CM | POA: Diagnosis not present

## 2018-05-04 DIAGNOSIS — J449 Chronic obstructive pulmonary disease, unspecified: Secondary | ICD-10-CM | POA: Diagnosis not present

## 2018-05-04 DIAGNOSIS — F329 Major depressive disorder, single episode, unspecified: Secondary | ICD-10-CM | POA: Diagnosis not present

## 2018-05-04 DIAGNOSIS — E7800 Pure hypercholesterolemia, unspecified: Secondary | ICD-10-CM | POA: Diagnosis not present

## 2018-05-04 DIAGNOSIS — H2511 Age-related nuclear cataract, right eye: Secondary | ICD-10-CM | POA: Diagnosis not present

## 2018-05-04 DIAGNOSIS — H52223 Regular astigmatism, bilateral: Secondary | ICD-10-CM | POA: Diagnosis not present

## 2018-05-04 DIAGNOSIS — I10 Essential (primary) hypertension: Secondary | ICD-10-CM | POA: Diagnosis not present

## 2018-05-04 DIAGNOSIS — H259 Unspecified age-related cataract: Secondary | ICD-10-CM | POA: Diagnosis not present

## 2018-05-18 DIAGNOSIS — Z7901 Long term (current) use of anticoagulants: Secondary | ICD-10-CM | POA: Diagnosis not present

## 2018-05-18 DIAGNOSIS — I5032 Chronic diastolic (congestive) heart failure: Secondary | ICD-10-CM | POA: Diagnosis not present

## 2018-05-18 DIAGNOSIS — I2721 Secondary pulmonary arterial hypertension: Secondary | ICD-10-CM | POA: Diagnosis not present

## 2018-05-18 DIAGNOSIS — I481 Persistent atrial fibrillation: Secondary | ICD-10-CM | POA: Diagnosis not present

## 2018-05-18 DIAGNOSIS — E782 Mixed hyperlipidemia: Secondary | ICD-10-CM | POA: Diagnosis not present

## 2018-05-18 DIAGNOSIS — I110 Hypertensive heart disease with heart failure: Secondary | ICD-10-CM | POA: Diagnosis not present

## 2018-05-25 DIAGNOSIS — I081 Rheumatic disorders of both mitral and tricuspid valves: Secondary | ICD-10-CM | POA: Diagnosis not present

## 2018-05-25 DIAGNOSIS — I371 Nonrheumatic pulmonary valve insufficiency: Secondary | ICD-10-CM | POA: Diagnosis not present

## 2018-06-01 DIAGNOSIS — I10 Essential (primary) hypertension: Secondary | ICD-10-CM | POA: Diagnosis not present

## 2018-06-01 DIAGNOSIS — Z961 Presence of intraocular lens: Secondary | ICD-10-CM | POA: Diagnosis not present

## 2018-06-01 DIAGNOSIS — Z9842 Cataract extraction status, left eye: Secondary | ICD-10-CM | POA: Diagnosis not present

## 2018-06-01 DIAGNOSIS — H2512 Age-related nuclear cataract, left eye: Secondary | ICD-10-CM | POA: Diagnosis not present

## 2018-06-01 DIAGNOSIS — E1136 Type 2 diabetes mellitus with diabetic cataract: Secondary | ICD-10-CM | POA: Diagnosis not present

## 2018-06-01 DIAGNOSIS — H259 Unspecified age-related cataract: Secondary | ICD-10-CM | POA: Diagnosis not present

## 2018-06-01 DIAGNOSIS — Z9841 Cataract extraction status, right eye: Secondary | ICD-10-CM | POA: Diagnosis not present

## 2018-06-01 DIAGNOSIS — E785 Hyperlipidemia, unspecified: Secondary | ICD-10-CM | POA: Diagnosis not present

## 2018-06-01 DIAGNOSIS — I4891 Unspecified atrial fibrillation: Secondary | ICD-10-CM | POA: Diagnosis not present

## 2018-06-01 DIAGNOSIS — F329 Major depressive disorder, single episode, unspecified: Secondary | ICD-10-CM | POA: Diagnosis not present

## 2018-06-01 DIAGNOSIS — H52223 Regular astigmatism, bilateral: Secondary | ICD-10-CM | POA: Diagnosis not present

## 2018-06-01 DIAGNOSIS — J449 Chronic obstructive pulmonary disease, unspecified: Secondary | ICD-10-CM | POA: Diagnosis not present

## 2018-06-01 DIAGNOSIS — D649 Anemia, unspecified: Secondary | ICD-10-CM | POA: Diagnosis not present

## 2018-07-14 DIAGNOSIS — Z6830 Body mass index (BMI) 30.0-30.9, adult: Secondary | ICD-10-CM | POA: Diagnosis not present

## 2018-07-14 DIAGNOSIS — E785 Hyperlipidemia, unspecified: Secondary | ICD-10-CM | POA: Diagnosis not present

## 2018-07-14 DIAGNOSIS — E118 Type 2 diabetes mellitus with unspecified complications: Secondary | ICD-10-CM | POA: Diagnosis not present

## 2018-07-14 DIAGNOSIS — R1011 Right upper quadrant pain: Secondary | ICD-10-CM | POA: Diagnosis not present

## 2018-07-14 DIAGNOSIS — E669 Obesity, unspecified: Secondary | ICD-10-CM | POA: Diagnosis not present

## 2018-07-14 DIAGNOSIS — Z23 Encounter for immunization: Secondary | ICD-10-CM | POA: Diagnosis not present

## 2018-07-14 DIAGNOSIS — Z1331 Encounter for screening for depression: Secondary | ICD-10-CM | POA: Diagnosis not present

## 2018-07-14 DIAGNOSIS — I10 Essential (primary) hypertension: Secondary | ICD-10-CM | POA: Diagnosis not present

## 2018-07-17 DIAGNOSIS — K8020 Calculus of gallbladder without cholecystitis without obstruction: Secondary | ICD-10-CM | POA: Diagnosis not present

## 2018-07-17 DIAGNOSIS — R1011 Right upper quadrant pain: Secondary | ICD-10-CM | POA: Diagnosis not present

## 2018-07-21 DIAGNOSIS — K8010 Calculus of gallbladder with chronic cholecystitis without obstruction: Secondary | ICD-10-CM | POA: Diagnosis not present

## 2018-07-21 DIAGNOSIS — Z6831 Body mass index (BMI) 31.0-31.9, adult: Secondary | ICD-10-CM | POA: Diagnosis not present

## 2018-07-22 DIAGNOSIS — N2889 Other specified disorders of kidney and ureter: Secondary | ICD-10-CM | POA: Diagnosis not present

## 2018-07-22 DIAGNOSIS — K8020 Calculus of gallbladder without cholecystitis without obstruction: Secondary | ICD-10-CM | POA: Diagnosis not present

## 2018-07-22 DIAGNOSIS — K869 Disease of pancreas, unspecified: Secondary | ICD-10-CM | POA: Diagnosis not present

## 2018-07-23 DIAGNOSIS — S4992XA Unspecified injury of left shoulder and upper arm, initial encounter: Secondary | ICD-10-CM | POA: Diagnosis not present

## 2018-07-23 DIAGNOSIS — E119 Type 2 diabetes mellitus without complications: Secondary | ICD-10-CM | POA: Diagnosis not present

## 2018-07-23 DIAGNOSIS — Z7901 Long term (current) use of anticoagulants: Secondary | ICD-10-CM | POA: Diagnosis not present

## 2018-07-23 DIAGNOSIS — S60512A Abrasion of left hand, initial encounter: Secondary | ICD-10-CM | POA: Diagnosis not present

## 2018-07-23 DIAGNOSIS — M79642 Pain in left hand: Secondary | ICD-10-CM | POA: Diagnosis not present

## 2018-07-23 DIAGNOSIS — Z7984 Long term (current) use of oral hypoglycemic drugs: Secondary | ICD-10-CM | POA: Diagnosis not present

## 2018-07-23 DIAGNOSIS — S52045A Nondisplaced fracture of coronoid process of left ulna, initial encounter for closed fracture: Secondary | ICD-10-CM | POA: Diagnosis not present

## 2018-07-23 DIAGNOSIS — Z79899 Other long term (current) drug therapy: Secondary | ICD-10-CM | POA: Diagnosis not present

## 2018-07-23 DIAGNOSIS — S0990XA Unspecified injury of head, initial encounter: Secondary | ICD-10-CM | POA: Diagnosis not present

## 2018-07-23 DIAGNOSIS — S5292XA Unspecified fracture of left forearm, initial encounter for closed fracture: Secondary | ICD-10-CM | POA: Diagnosis not present

## 2018-07-23 DIAGNOSIS — M25512 Pain in left shoulder: Secondary | ICD-10-CM | POA: Diagnosis not present

## 2018-07-23 DIAGNOSIS — M25422 Effusion, left elbow: Secondary | ICD-10-CM | POA: Diagnosis not present

## 2018-07-26 DIAGNOSIS — E119 Type 2 diabetes mellitus without complications: Secondary | ICD-10-CM | POA: Diagnosis not present

## 2018-07-26 DIAGNOSIS — K915 Postcholecystectomy syndrome: Secondary | ICD-10-CM | POA: Diagnosis not present

## 2018-07-26 DIAGNOSIS — K8010 Calculus of gallbladder with chronic cholecystitis without obstruction: Secondary | ICD-10-CM | POA: Diagnosis not present

## 2018-07-26 DIAGNOSIS — F329 Major depressive disorder, single episode, unspecified: Secondary | ICD-10-CM | POA: Diagnosis not present

## 2018-07-26 DIAGNOSIS — I110 Hypertensive heart disease with heart failure: Secondary | ICD-10-CM | POA: Diagnosis not present

## 2018-07-26 DIAGNOSIS — E782 Mixed hyperlipidemia: Secondary | ICD-10-CM | POA: Diagnosis not present

## 2018-07-26 DIAGNOSIS — I4891 Unspecified atrial fibrillation: Secondary | ICD-10-CM | POA: Diagnosis not present

## 2018-07-26 DIAGNOSIS — E538 Deficiency of other specified B group vitamins: Secondary | ICD-10-CM | POA: Diagnosis not present

## 2018-07-26 DIAGNOSIS — F419 Anxiety disorder, unspecified: Secondary | ICD-10-CM | POA: Diagnosis not present

## 2018-07-26 DIAGNOSIS — D649 Anemia, unspecified: Secondary | ICD-10-CM | POA: Diagnosis not present

## 2018-07-26 DIAGNOSIS — E559 Vitamin D deficiency, unspecified: Secondary | ICD-10-CM | POA: Diagnosis not present

## 2018-08-02 DIAGNOSIS — S63502A Unspecified sprain of left wrist, initial encounter: Secondary | ICD-10-CM | POA: Diagnosis not present

## 2018-08-02 DIAGNOSIS — S52045A Nondisplaced fracture of coronoid process of left ulna, initial encounter for closed fracture: Secondary | ICD-10-CM | POA: Diagnosis not present

## 2018-08-05 DIAGNOSIS — I700 Atherosclerosis of aorta: Secondary | ICD-10-CM | POA: Diagnosis not present

## 2018-08-05 DIAGNOSIS — I10 Essential (primary) hypertension: Secondary | ICD-10-CM | POA: Diagnosis not present

## 2018-08-05 DIAGNOSIS — N281 Cyst of kidney, acquired: Secondary | ICD-10-CM | POA: Diagnosis not present

## 2018-08-11 DIAGNOSIS — Z6830 Body mass index (BMI) 30.0-30.9, adult: Secondary | ICD-10-CM | POA: Diagnosis not present

## 2018-08-11 DIAGNOSIS — J449 Chronic obstructive pulmonary disease, unspecified: Secondary | ICD-10-CM | POA: Diagnosis not present

## 2018-08-11 DIAGNOSIS — N2889 Other specified disorders of kidney and ureter: Secondary | ICD-10-CM | POA: Diagnosis not present

## 2018-08-24 DIAGNOSIS — R3129 Other microscopic hematuria: Secondary | ICD-10-CM | POA: Diagnosis not present

## 2018-08-24 DIAGNOSIS — N2889 Other specified disorders of kidney and ureter: Secondary | ICD-10-CM | POA: Diagnosis not present

## 2018-08-26 ENCOUNTER — Other Ambulatory Visit (HOSPITAL_COMMUNITY): Payer: Self-pay | Admitting: Urology

## 2018-08-26 DIAGNOSIS — N2889 Other specified disorders of kidney and ureter: Secondary | ICD-10-CM

## 2018-08-31 DIAGNOSIS — Z6829 Body mass index (BMI) 29.0-29.9, adult: Secondary | ICD-10-CM | POA: Diagnosis not present

## 2018-08-31 DIAGNOSIS — B372 Candidiasis of skin and nail: Secondary | ICD-10-CM | POA: Diagnosis not present

## 2018-08-31 DIAGNOSIS — I10 Essential (primary) hypertension: Secondary | ICD-10-CM | POA: Diagnosis not present

## 2018-08-31 DIAGNOSIS — N2889 Other specified disorders of kidney and ureter: Secondary | ICD-10-CM | POA: Diagnosis not present

## 2018-08-31 DIAGNOSIS — R110 Nausea: Secondary | ICD-10-CM | POA: Diagnosis not present

## 2018-08-31 DIAGNOSIS — J449 Chronic obstructive pulmonary disease, unspecified: Secondary | ICD-10-CM | POA: Diagnosis not present

## 2018-09-03 DIAGNOSIS — S63502A Unspecified sprain of left wrist, initial encounter: Secondary | ICD-10-CM | POA: Diagnosis not present

## 2018-09-03 DIAGNOSIS — S52345A Nondisplaced spiral fracture of shaft of radius, left arm, initial encounter for closed fracture: Secondary | ICD-10-CM | POA: Diagnosis not present

## 2018-09-03 DIAGNOSIS — M1711 Unilateral primary osteoarthritis, right knee: Secondary | ICD-10-CM | POA: Diagnosis not present

## 2018-09-09 ENCOUNTER — Other Ambulatory Visit: Payer: Self-pay | Admitting: Urology

## 2018-09-09 DIAGNOSIS — N2889 Other specified disorders of kidney and ureter: Secondary | ICD-10-CM | POA: Diagnosis not present

## 2018-09-09 DIAGNOSIS — R3129 Other microscopic hematuria: Secondary | ICD-10-CM | POA: Diagnosis not present

## 2018-09-29 ENCOUNTER — Ambulatory Visit
Admission: RE | Admit: 2018-09-29 | Discharge: 2018-09-29 | Disposition: A | Discharge status: Home / Self Care | Payer: Medicare PPO | Source: Ambulatory Visit | Attending: Urology | Admitting: Urology

## 2018-09-29 DIAGNOSIS — N2889 Other specified disorders of kidney and ureter: Secondary | ICD-10-CM

## 2018-09-29 DIAGNOSIS — Z9049 Acquired absence of other specified parts of digestive tract: Secondary | ICD-10-CM | POA: Diagnosis not present

## 2018-09-29 HISTORY — DX: Other specified disorders of kidney and ureter: N28.89

## 2018-09-29 HISTORY — DX: Cardiac arrhythmia, unspecified: I49.9

## 2018-09-29 HISTORY — DX: Unspecified atrial fibrillation: I48.91

## 2018-09-29 HISTORY — DX: Malignant (primary) neoplasm, unspecified: C80.1

## 2018-09-29 HISTORY — PX: IR RADIOLOGIST EVAL & MGMT: IMG5224

## 2018-09-29 HISTORY — DX: Other chronic pain: G89.29

## 2018-09-29 HISTORY — DX: Type 2 diabetes mellitus without complications: E11.9

## 2018-09-29 HISTORY — DX: Chronic kidney disease, unspecified: N18.9

## 2018-09-29 HISTORY — DX: Anemia, unspecified: D64.9

## 2018-09-29 HISTORY — DX: Deficiency of other specified B group vitamins: E53.8

## 2018-09-29 HISTORY — DX: Vitamin D deficiency, unspecified: E55.9

## 2018-09-29 HISTORY — DX: Unspecified osteoarthritis, unspecified site: M19.90

## 2018-09-29 HISTORY — DX: Angina pectoris, unspecified: I20.9

## 2018-09-29 HISTORY — DX: Age-related osteoporosis without current pathological fracture: M81.0

## 2018-09-29 HISTORY — DX: Essential (primary) hypertension: I10

## 2018-09-29 HISTORY — DX: Dorsalgia, unspecified: M54.9

## 2018-09-29 HISTORY — DX: Heart failure, unspecified: I50.9

## 2018-10-11 NOTE — Consult Note (Signed)
Chief Complaint: Patient was seen in consultation today for possible ablation of right renal mass at the request of Chao,Roberto  Referring Physician(s): Chao,Roberto  History of Present Illness: Lori Rodriguez is a 84 y.o. female with incidental detection of right renal lesions by ultrasound on 07/17/18 for right upper quadrant abdominal pain. MRI was attempted on 07/22/18 but the patient tolerated the exam poorly due to claustrophobia and refused IV contrast. The patient underwent cholecystectomy on 07/26/18. A CT of the abdomen was performed with contrast on 08/05/18 demonstrating a mildly enhancing 11-12 mm upper pole lesion of the right kidney and a 1.9 cm solid, enhancing lesion of the anterior interpolar right kidney.   Lori Rodriguez has recovered after cholecystectomy and is asymptomatic. She denies any urinary symptoms.   Past Medical History:  Diagnosis Date  . Anemia   . Anginal pain ([place])   . Atrial fibrillation ([place])   . Cancer [facility])    Left Breast Cancer  . CHF (congestive heart failure) ([place])   . Chronic back pain   . Chronic kidney disease    Right Renal Mass  . Diabetes mellitus without complication ([place])   . DJD (degenerative joint disease)   . Dysrhythmia    Atrial fibrillation  . Hypertension   . Osteoporosis   . Right renal mass   . Vitamin B12 deficiency   . Vitamin D deficiency       Allergies: Lisinopril; Percocet [oxycodone-acetaminophen]; Valium [diazepam]; and Prednisone  Medications: Prior to Admission medications   Medication Sig Start Date End Date Taking? Authorizing Provider  atorvastatin (LIPITOR) 40 MG tablet Take 40 mg by mouth daily. 12/19/16  Yes [provider]  fluticasone furoate-vilanterol (BREO ELLIPTA) 100-25 MCG/INH AEPB Place 1 puff into the nose daily. 03/17/18  Yes [provider]  furosemide (LASIX) 20 MG tablet Take 20 mg by mouth daily. 06/06/16  Yes [provider]  glipiZIDE (GLUCOTROL XL)  2.5 MG 24 hr tablet Take 2.5 mg by mouth daily. 12/19/16  Yes [provider]  metoprolol tartrate (LOPRESSOR) 100 MG tablet Take 100 mg by mouth 2 (two) times daily. 05/08/16  Yes [provider]  olmesartan (BENICAR) 40 MG tablet Take 40 mg by mouth daily. 03/24/17  Yes [provider]  rivaroxaban (XARELTO) 20 MG TABS tablet Take 20 mg by mouth daily. 11/10/17  Yes [provider]  traMADol (ULTRAM) 50 MG tablet Take 50 mg by mouth daily as needed. 03/27/17  Yes [provider]  Vitamin D, Ergocalciferol, (DRISDOL) 1.25 MG (50000 UT) CAPS capsule Take 50,000 Units by mouth once a week. 06/02/16  Yes [provider]  albuterol (PROVENTIL HFA;VENTOLIN HFA) 108 (90 Base) MCG/ACT inhaler Inhale 2 puffs into the lungs 4 (four) times daily as needed.    [provider]  anastrozole (ARIMIDEX) 1 MG tablet Take 1 mg by mouth daily.    [provider]  Calcium Carbonate-Vitamin D (CALCIUM-VITAMIN D3) 600-125 MG-UNIT TABS Take 1 capsule by mouth daily.    [provider]  ferrous sulfate 325 (65 FE) MG tablet Take 325 mg by mouth daily.    [provider]  metFORMIN (GLUCOPHAGE) 1000 MG tablet Take 1,000 mg by mouth 2 (two) times daily.    [provider]  oxybutynin (DITROPAN-XL) 10 MG 24 hr tablet Take 10 mg by mouth daily.    [provider]  sertraline (ZOLOFT) 100 MG tablet Take 10 mg by mouth daily.    [provider]  vitamin B-12 (CYANOCOBALAMIN) 1000 MCG tablet Take 1,000 mcg by mouth daily. Take 2 tablets daily    [provider]     No family history on file.  Social History   Socioeconomic History  . Marital status: Widowed    Spouse name: Not on file  . Number of children: Not on file  . Years of education: Not on file  . Highest education level: Not on file  Occupational History  . Not on file  Social Needs  . Financial resource strain: Not on file  . Food  insecurity:    Worry: Not on file    Inability: Not on file  . Transportation needs:    Medical: Not on file    Non-medical: Not on file  Tobacco Use  . Smoking status: Not on file  Substance and Sexual Activity  . Alcohol use: Not on file  . Drug use: Not on file  . Sexual activity: Not on file  Lifestyle  . Physical activity:    Days per week: Not on file    Minutes per session: Not on file  . Stress: Not on file  Relationships  . Social connections:    Talks on phone: Not on file    Gets together: Not on file    Attends religious service: Not on file    Active member of club or organization: Not on file    Attends meetings of clubs or organizations: Not on file    Relationship status: Not on file  Other Topics Concern  . Not on file  Social History Narrative  . Not on file    Review of Systems: A 12 point ROS discussed and pertinent positives are indicated in the HPI above.  All other systems are negative.  Review of Systems  Constitutional: Negative.   HENT: Negative.   Respiratory: Negative.   Cardiovascular: Negative.   Gastrointestinal: Negative.   Genitourinary: Negative.   Musculoskeletal: Negative.   Neurological: Negative.     Vital Signs: BP (!) 151/80   Pulse 73   Temp 98.1 F (36.7 C) (Oral)   Resp 14   Ht '5\' 7"'  (1.702 m)   Wt 84.8 kg   SpO2 96%   BMI 29.29 kg/m   Physical Exam Vitals signs reviewed.  Constitutional:      General: She is not in acute distress.    Appearance: Normal appearance. She is not ill-appearing, toxic-appearing or diaphoretic.  Neck:     Musculoskeletal: Neck supple. No muscular tenderness.  Cardiovascular:     Rate and Rhythm: Normal rate and regular rhythm.     Heart sounds: Normal heart sounds. No murmur. No gallop.   Pulmonary:     Effort: Pulmonary effort is normal. No respiratory distress.     Breath sounds: Normal breath sounds. No stridor. No wheezing, rhonchi or rales.  Abdominal:     General: Bowel  sounds are normal. There is no distension.     Palpations: Abdomen is soft. There is no mass.     Tenderness: There is no abdominal tenderness. There is no guarding or rebound.     Hernia: No hernia is present.  Musculoskeletal:        General: No swelling.  Lymphadenopathy:     Cervical: No cervical adenopathy.  Skin:    General: Skin is warm and dry.  Neurological:     Mental Status: She is alert and oriented to person, place, and time.  Imaging: Ir Radiologist Eval & Mgmt  Result Date: 09/29/2018 Please refer to notes tab for details about interventional procedure. (Op Note)   Labs: BUN 11, Cr 0.6 on 07/14/18   Assessment and Plan:  I met with Lori Rodriguez and her daughter Lori Rodriguez. We reviewed imaging findings. By CT, the anterior 1.8-1.9 cm interpolar lesion is more suspicious of malignancy. The small upper pole lesion is more cystic in appearance and may represent a complex cyst.  Complicating treatment is location of the lesion which is in close proximity to the renal pelvis and UPJ in the medial, anterior mid kidney. Lori Rodriguez is also chronically anticoagulated for atrial fibrillation but appears to be in sinus rhythm today. She states that she has a cardiology appointment in February.  We discussed possible cryoablation and biopsy of the anterior interpolar mass. This may require placement of a ureteral stent prior to ablation to relatively protect the renal pelvis and UPJ. Since there is no growth data of the renal lesions and the upper pole lesion may be cystic, I initially recommended an MRI of the abdomen with contrast in May, 6 months after the prior CT. There is an open magnet here that she might better tolerate scanning in and she will also be given Xanax prior to the scan due to claustrophobia.  I will meet with Lori Rodriguez and her daughter after the follow up MRI is performed to discuss findings and potential future treatment.  Thank you for this interesting  consult.  I greatly enjoyed meeting Lori Rodriguez and look forward to participating in their care.  A copy of this report was sent to the requesting provider on this date.  Electronically Signed: Azzie Roup 10/11/2018, 8:37 AM   I spent a total of  40 Minutes  in face to face in clinical consultation, greater than 50% of which was counseling/coordinating care for right renal masses.

## 2018-10-12 DIAGNOSIS — I4891 Unspecified atrial fibrillation: Secondary | ICD-10-CM | POA: Diagnosis not present

## 2018-10-12 DIAGNOSIS — E119 Type 2 diabetes mellitus without complications: Secondary | ICD-10-CM | POA: Diagnosis not present

## 2018-10-12 DIAGNOSIS — M109 Gout, unspecified: Secondary | ICD-10-CM | POA: Diagnosis not present

## 2018-10-12 DIAGNOSIS — M79672 Pain in left foot: Secondary | ICD-10-CM | POA: Diagnosis not present

## 2018-10-12 DIAGNOSIS — I110 Hypertensive heart disease with heart failure: Secondary | ICD-10-CM | POA: Diagnosis not present

## 2018-10-12 DIAGNOSIS — M10072 Idiopathic gout, left ankle and foot: Secondary | ICD-10-CM | POA: Diagnosis not present

## 2018-10-12 DIAGNOSIS — M7989 Other specified soft tissue disorders: Secondary | ICD-10-CM | POA: Diagnosis not present

## 2018-10-12 DIAGNOSIS — I509 Heart failure, unspecified: Secondary | ICD-10-CM | POA: Diagnosis not present

## 2018-10-12 DIAGNOSIS — M19072 Primary osteoarthritis, left ankle and foot: Secondary | ICD-10-CM | POA: Diagnosis not present

## 2018-10-12 DIAGNOSIS — Z7984 Long term (current) use of oral hypoglycemic drugs: Secondary | ICD-10-CM | POA: Diagnosis not present

## 2018-10-12 DIAGNOSIS — Z8709 Personal history of other diseases of the respiratory system: Secondary | ICD-10-CM | POA: Diagnosis not present

## 2018-10-12 DIAGNOSIS — Z79899 Other long term (current) drug therapy: Secondary | ICD-10-CM | POA: Diagnosis not present

## 2018-10-12 DIAGNOSIS — M1990 Unspecified osteoarthritis, unspecified site: Secondary | ICD-10-CM | POA: Diagnosis not present

## 2018-10-14 DIAGNOSIS — J449 Chronic obstructive pulmonary disease, unspecified: Secondary | ICD-10-CM | POA: Diagnosis not present

## 2018-10-14 DIAGNOSIS — M545 Low back pain: Secondary | ICD-10-CM | POA: Diagnosis not present

## 2018-10-14 DIAGNOSIS — I4891 Unspecified atrial fibrillation: Secondary | ICD-10-CM | POA: Diagnosis not present

## 2018-10-14 DIAGNOSIS — E118 Type 2 diabetes mellitus with unspecified complications: Secondary | ICD-10-CM | POA: Diagnosis not present

## 2018-10-14 DIAGNOSIS — E785 Hyperlipidemia, unspecified: Secondary | ICD-10-CM | POA: Diagnosis not present

## 2018-10-14 DIAGNOSIS — M1990 Unspecified osteoarthritis, unspecified site: Secondary | ICD-10-CM | POA: Diagnosis not present

## 2018-10-14 DIAGNOSIS — M79672 Pain in left foot: Secondary | ICD-10-CM | POA: Diagnosis not present

## 2018-10-14 DIAGNOSIS — I10 Essential (primary) hypertension: Secondary | ICD-10-CM | POA: Diagnosis not present

## 2018-10-14 DIAGNOSIS — N2889 Other specified disorders of kidney and ureter: Secondary | ICD-10-CM | POA: Diagnosis not present

## 2018-10-14 DIAGNOSIS — C50919 Malignant neoplasm of unspecified site of unspecified female breast: Secondary | ICD-10-CM | POA: Diagnosis not present

## 2018-10-14 DIAGNOSIS — D649 Anemia, unspecified: Secondary | ICD-10-CM | POA: Diagnosis not present

## 2018-10-14 DIAGNOSIS — Z79899 Other long term (current) drug therapy: Secondary | ICD-10-CM | POA: Diagnosis not present

## 2018-10-22 DIAGNOSIS — I509 Heart failure, unspecified: Secondary | ICD-10-CM | POA: Diagnosis not present

## 2018-10-22 DIAGNOSIS — R112 Nausea with vomiting, unspecified: Secondary | ICD-10-CM | POA: Diagnosis not present

## 2018-10-22 DIAGNOSIS — I10 Essential (primary) hypertension: Secondary | ICD-10-CM | POA: Diagnosis not present

## 2018-10-22 DIAGNOSIS — I4891 Unspecified atrial fibrillation: Secondary | ICD-10-CM | POA: Diagnosis not present

## 2018-10-22 DIAGNOSIS — Z79899 Other long term (current) drug therapy: Secondary | ICD-10-CM | POA: Diagnosis not present

## 2018-10-22 DIAGNOSIS — Z7984 Long term (current) use of oral hypoglycemic drugs: Secondary | ICD-10-CM | POA: Diagnosis not present

## 2018-10-22 DIAGNOSIS — I6782 Cerebral ischemia: Secondary | ICD-10-CM | POA: Diagnosis not present

## 2018-10-22 DIAGNOSIS — E119 Type 2 diabetes mellitus without complications: Secondary | ICD-10-CM | POA: Diagnosis not present

## 2018-10-22 DIAGNOSIS — Z8709 Personal history of other diseases of the respiratory system: Secondary | ICD-10-CM | POA: Diagnosis not present

## 2018-10-22 DIAGNOSIS — I110 Hypertensive heart disease with heart failure: Secondary | ICD-10-CM | POA: Diagnosis not present

## 2018-10-22 DIAGNOSIS — N2889 Other specified disorders of kidney and ureter: Secondary | ICD-10-CM | POA: Diagnosis not present

## 2018-10-22 DIAGNOSIS — I160 Hypertensive urgency: Secondary | ICD-10-CM | POA: Diagnosis not present

## 2018-11-03 DIAGNOSIS — I2721 Secondary pulmonary arterial hypertension: Secondary | ICD-10-CM

## 2018-11-03 HISTORY — DX: Secondary pulmonary arterial hypertension: I27.21

## 2018-11-09 DIAGNOSIS — M1990 Unspecified osteoarthritis, unspecified site: Secondary | ICD-10-CM | POA: Diagnosis not present

## 2018-11-09 DIAGNOSIS — E118 Type 2 diabetes mellitus with unspecified complications: Secondary | ICD-10-CM | POA: Diagnosis not present

## 2018-11-09 DIAGNOSIS — I4891 Unspecified atrial fibrillation: Secondary | ICD-10-CM | POA: Diagnosis not present

## 2018-11-09 DIAGNOSIS — D649 Anemia, unspecified: Secondary | ICD-10-CM | POA: Diagnosis not present

## 2018-11-09 DIAGNOSIS — I10 Essential (primary) hypertension: Secondary | ICD-10-CM | POA: Diagnosis not present

## 2018-11-09 DIAGNOSIS — C50919 Malignant neoplasm of unspecified site of unspecified female breast: Secondary | ICD-10-CM | POA: Diagnosis not present

## 2018-11-09 DIAGNOSIS — F329 Major depressive disorder, single episode, unspecified: Secondary | ICD-10-CM | POA: Diagnosis not present

## 2018-11-09 DIAGNOSIS — E785 Hyperlipidemia, unspecified: Secondary | ICD-10-CM | POA: Diagnosis not present

## 2018-11-09 DIAGNOSIS — J449 Chronic obstructive pulmonary disease, unspecified: Secondary | ICD-10-CM | POA: Diagnosis not present

## 2018-11-22 DIAGNOSIS — S7001XA Contusion of right hip, initial encounter: Secondary | ICD-10-CM | POA: Diagnosis not present

## 2018-11-24 DIAGNOSIS — M8589 Other specified disorders of bone density and structure, multiple sites: Secondary | ICD-10-CM | POA: Diagnosis not present

## 2018-11-24 DIAGNOSIS — M85832 Other specified disorders of bone density and structure, left forearm: Secondary | ICD-10-CM | POA: Diagnosis not present

## 2018-11-30 DIAGNOSIS — Z170 Estrogen receptor positive status [ER+]: Secondary | ICD-10-CM | POA: Diagnosis not present

## 2018-11-30 DIAGNOSIS — N289 Disorder of kidney and ureter, unspecified: Secondary | ICD-10-CM | POA: Diagnosis not present

## 2018-11-30 DIAGNOSIS — Z853 Personal history of malignant neoplasm of breast: Secondary | ICD-10-CM | POA: Diagnosis not present

## 2018-11-30 DIAGNOSIS — C50912 Malignant neoplasm of unspecified site of left female breast: Secondary | ICD-10-CM | POA: Diagnosis not present

## 2018-11-30 DIAGNOSIS — E041 Nontoxic single thyroid nodule: Secondary | ICD-10-CM | POA: Diagnosis not present

## 2018-11-30 DIAGNOSIS — D649 Anemia, unspecified: Secondary | ICD-10-CM | POA: Diagnosis not present

## 2018-12-01 DIAGNOSIS — I951 Orthostatic hypotension: Secondary | ICD-10-CM

## 2018-12-01 HISTORY — DX: Orthostatic hypotension: I95.1

## 2018-12-01 NOTE — Progress Notes (Signed)
Cardiology Office Note:    Date:  12/02/2018   ID:  Lori Rodriguez, DOB 28-Aug-1935, MRN [id number]  PCP:  Ocie Doyne., MD  Cardiologist:  Shirlee More, MD    Referring MD: Ocie Doyne., MD    ASSESSMENT:    1. Persistent atrial fibrillation   2. Hypertensive heart disease with chronic diastolic congestive heart failure ([place])   3. Long term (current) use of anticoagulants   4. Mixed hyperlipidemia   5. Orthostatic hypotension    PLAN:    In order of problems listed above:  1. A. fib stable rate controlled she is anticoagulated and continue her beta-blocker. 2. Stable blood pressure at target she has a prescription for clonidine if needed for crisis and continue her current loop diuretic compensated New York Heart Association class I no edema 3. Continue her current anticoagulant no bleeding complication 4. Statins are at target continue current high intensity statin 5. Stable diabetes managed by her PCP 6. Stable asymptomatic   Next appointment: 6 months   Medication Adjustments/Labs and Tests Ordered: Current medicines are reviewed at length with the patient today.  Concerns regarding medicines are outlined above.  Orders Placed This Encounter  Procedures  . EKG 12-Lead   No orders of the defined types were placed in this encounter.   Chief Complaint  Patient presents with  . Follow-up  . Congestive Heart Failure  . Atrial Fibrillation  . Anticoagulation  . Hypertension  . Hypotension    History of Present Illness:    Lori Rodriguez is a 84 y.o. female with a hx of chronic diastolic heart failure persistent atrial fibrillation anticoagulated dyslipidemia hypertension with orthostatic hypotension last seen 01/08/17 at [facility]. Compliance with diet, lifestyle and medications: Yes She was seen at [facility] ED 10/04/2018 with headache nausea vomiting and accelerated hypertension blood pressure 203/100 in the emergency room she received  Catapres blood pressure decreased discharged home studies performed including a normal CBC and BMP EKG in the emergency room was interpreted as junctional I independently reviewed was atrial fibrillation controlled rate.  Since then she is use Clonidine on a few day basis but in general her blood pressure is well controlled weight is stable no edema orthopnea shortness of breath no chest pain palpitation or syncope she is seeing a urologist and may have to have intervention for renal mass.  Laboratory studies 10/14/2018 show cholesterol 132 HDL 39 LDL 67 Past Medical History:  Diagnosis Date  . Anemia   . Anginal pain ([place])   . Atrial fibrillation ([place])   . Cancer [facility])    Left Breast Cancer  . CHF (congestive heart failure) ([place])   . Chronic back pain   . Chronic kidney disease    Right Renal Mass  . Diabetes mellitus without complication ([place])   . DJD (degenerative joint disease)   . Dysrhythmia    Atrial fibrillation  . Hypertension   . Osteoporosis   . Right renal mass   . Vitamin B12 deficiency   . Vitamin D deficiency     Past Surgical History:  Procedure Laterality Date  . BREAST LUMPECTOMY Left   . CARDIAC CATHETERIZATION    . CATARACT EXTRACTION, BILATERAL    . CHOLECYSTECTOMY    . IR RADIOLOGIST EVAL & MGMT  09/29/2018  . TOTAL HIP ARTHROPLASTY Bilateral     Current Medications: Current Meds  Medication Sig  . albuterol (PROVENTIL HFA;VENTOLIN HFA) 108 (90 Base) MCG/ACT inhaler Inhale 2 puffs into  the lungs 4 (four) times daily as needed.  Marland Kitchen anastrozole (ARIMIDEX) 1 MG tablet Take 1 mg by mouth daily.  Marland Kitchen atorvastatin (LIPITOR) 40 MG tablet Take 40 mg by mouth daily.  . Calcium Carbonate-Vitamin D (CALCIUM-VITAMIN D3) 600-125 MG-UNIT TABS Take 1 capsule by mouth daily.  . cloNIDine (CATAPRES) 0.2 MG tablet Take 0.2 mg by mouth as needed. If systolic BP greater than 010  . ferrous sulfate 325 (65 FE) MG tablet Take 325 mg by mouth every other day.   . fluticasone  furoate-vilanterol (BREO ELLIPTA) 100-25 MCG/INH AEPB Place 1 puff into the nose daily.  . furosemide (LASIX) 20 MG tablet Take 20 mg by mouth daily. Can take second tablet in the evening if weight increases more than 3 lbs  . glipiZIDE (GLUCOTROL XL) 2.5 MG 24 hr tablet Take 2.5 mg by mouth daily.  . metFORMIN (GLUCOPHAGE) 1000 MG tablet Take 1,000 mg by mouth 2 (two) times daily.  . metoprolol tartrate (LOPRESSOR) 100 MG tablet Take 100 mg by mouth 2 (two) times daily.  Marland Kitchen olmesartan (BENICAR) 40 MG tablet Take 40 mg by mouth daily.  Marland Kitchen oxybutynin (DITROPAN-XL) 10 MG 24 hr tablet Take 10 mg by mouth daily.  . rivaroxaban (XARELTO) 20 MG TABS tablet Take 20 mg by mouth daily.  . sertraline (ZOLOFT) 100 MG tablet Take 10 mg by mouth daily.  . traMADol (ULTRAM) 50 MG tablet Take 50 mg by mouth daily as needed.  . vitamin B-12 (CYANOCOBALAMIN) 1000 MCG tablet Take 2,500 mcg by mouth daily. Take 2 tablets daily  . Vitamin D, Ergocalciferol, (DRISDOL) 1.25 MG (50000 UT) CAPS capsule Take 50,000 Units by mouth once a week.     Allergies:   Lisinopril; Percocet [oxycodone-acetaminophen]; Valium [diazepam]; and Prednisone   Social History   Socioeconomic History  . Marital status: Widowed    Spouse name: Not on file  . Number of children: Not on file  . Years of education: Not on file  . Highest education level: Not on file  Occupational History  . Not on file  Social Needs  . Financial resource strain: Not on file  . Food insecurity:    Worry: Not on file    Inability: Not on file  . Transportation needs:    Medical: Not on file    Non-medical: Not on file  Tobacco Use  . Smoking status: Never Smoker  . Smokeless tobacco: Never Used  Substance and Sexual Activity  . Alcohol use: Never    Frequency: Never  . Drug use: Never  . Sexual activity: Not on file  Lifestyle  . Physical activity:    Days per week: Not on file    Minutes per session: Not on file  . Stress: Not on file    Relationships  . Social connections:    Talks on phone: Not on file    Gets together: Not on file    Attends religious service: Not on file    Active member of club or organization: Not on file    Attends meetings of clubs or organizations: Not on file    Relationship status: Not on file  Other Topics Concern  . Not on file  Social History Narrative  . Not on file     Family History: The patient's family history includes Diabetes in her brother; Heart attack in her brother and father; Myasthenia gravis in her brother. ROS:   Please see the history of present illness.    All  other systems reviewed and are negative.  EKGs/Labs/Other Studies Reviewed:    The following studies were reviewed today:  EKG:  EKG ordered today and personally reviewed.  The ekg ordered today demonstrates atrial fibrillation controlled rate  Recent Labs: No results found for requested labs within last 8760 hours.  Recent Lipid Panel No results found for: CHOL, TRIG, HDL, CHOLHDL, VLDL, LDLCALC, LDLDIRECT  Physical Exam:    VS:  BP 132/78 (BP Location: Right Arm, Patient Position: Sitting, Cuff Size: Normal)   Pulse 69   Ht 5\' 7"  (1.702 m)   Wt 192 lb 12.8 oz (87.5 kg)   SpO2 97%   BMI 30.20 kg/m     Wt Readings from Last 3 Encounters:  12/02/18 192 lb 12.8 oz (87.5 kg)  09/29/18 187 lb (84.8 kg)     GEN:  Well nourished, well developed in no acute distress HEENT: Normal NECK: No JVD; No carotid bruits LYMPHATICS: No lymphadenopathy CARDIAC: IrrIrr variable S1  no murmurs, rubs, gallops RESPIRATORY:  Clear to auscultation without rales, wheezing or rhonchi  ABDOMEN: Soft, non-tender, non-distended MUSCULOSKELETAL:  No edema; No deformity  SKIN: Warm and dry NEUROLOGIC:  Alert and oriented x 3 PSYCHIATRIC:  Normal affect    Signed, Shirlee More, MD  12/02/2018 3:02 PM    [facility]

## 2018-12-02 ENCOUNTER — Other Ambulatory Visit: Payer: Self-pay

## 2018-12-02 ENCOUNTER — Ambulatory Visit (INDEPENDENT_AMBULATORY_CARE_PROVIDER_SITE_OTHER): Payer: Medicare PPO | Admitting: Cardiology

## 2018-12-02 ENCOUNTER — Encounter: Payer: Self-pay | Admitting: Cardiology

## 2018-12-02 VITALS — BP 132/78 | HR 69 | Ht 67.0 in | Wt 192.8 lb

## 2018-12-02 DIAGNOSIS — Z7901 Long term (current) use of anticoagulants: Secondary | ICD-10-CM | POA: Diagnosis not present

## 2018-12-02 DIAGNOSIS — I5032 Chronic diastolic (congestive) heart failure: Secondary | ICD-10-CM

## 2018-12-02 DIAGNOSIS — E782 Mixed hyperlipidemia: Secondary | ICD-10-CM

## 2018-12-02 DIAGNOSIS — I110 Hypertensive heart disease with heart failure: Secondary | ICD-10-CM | POA: Diagnosis not present

## 2018-12-02 DIAGNOSIS — I4819 Other persistent atrial fibrillation: Secondary | ICD-10-CM

## 2018-12-02 DIAGNOSIS — I951 Orthostatic hypotension: Secondary | ICD-10-CM

## 2018-12-02 NOTE — Patient Instructions (Signed)
Medication Instructions:  Your physician recommends that you continue on your current medications as directed. Please refer to the Current Medication list given to you today.  If you need a refill on your cardiac medications before your next appointment, please call your pharmacy.   Lab work: None  If you have labs (blood work) drawn today and your tests are completely normal, you will receive your results only by: Marland Kitchen MyChart Message (if you have MyChart) OR . A paper copy in the mail If you have any lab test that is abnormal or we need to change your treatment, we will call you to review the results.  Testing/Procedures: You had an EKG today.   Follow-Up: At [facility], you and your health needs are our priority.  As part of our continuing mission to provide you with exceptional heart care, we have created designated Provider Care Teams.  These Care Teams include your primary Cardiologist (physician) and Advanced Practice Providers (APPs -  Physician Assistants and Nurse Practitioners) who all work together to provide you with the care you need, when you need it. You will need a follow up appointment in 6 months.

## 2019-01-17 DIAGNOSIS — F329 Major depressive disorder, single episode, unspecified: Secondary | ICD-10-CM | POA: Diagnosis not present

## 2019-01-17 DIAGNOSIS — D649 Anemia, unspecified: Secondary | ICD-10-CM | POA: Diagnosis not present

## 2019-01-17 DIAGNOSIS — E785 Hyperlipidemia, unspecified: Secondary | ICD-10-CM | POA: Diagnosis not present

## 2019-01-17 DIAGNOSIS — Z9181 History of falling: Secondary | ICD-10-CM | POA: Diagnosis not present

## 2019-01-17 DIAGNOSIS — I4891 Unspecified atrial fibrillation: Secondary | ICD-10-CM | POA: Diagnosis not present

## 2019-01-17 DIAGNOSIS — I50810 Right heart failure, unspecified: Secondary | ICD-10-CM | POA: Diagnosis not present

## 2019-01-17 DIAGNOSIS — E118 Type 2 diabetes mellitus with unspecified complications: Secondary | ICD-10-CM | POA: Diagnosis not present

## 2019-01-17 DIAGNOSIS — C50919 Malignant neoplasm of unspecified site of unspecified female breast: Secondary | ICD-10-CM | POA: Diagnosis not present

## 2019-01-17 DIAGNOSIS — J449 Chronic obstructive pulmonary disease, unspecified: Secondary | ICD-10-CM | POA: Diagnosis not present

## 2019-02-15 DIAGNOSIS — F329 Major depressive disorder, single episode, unspecified: Secondary | ICD-10-CM | POA: Diagnosis not present

## 2019-02-15 DIAGNOSIS — E785 Hyperlipidemia, unspecified: Secondary | ICD-10-CM | POA: Diagnosis not present

## 2019-02-15 DIAGNOSIS — J449 Chronic obstructive pulmonary disease, unspecified: Secondary | ICD-10-CM | POA: Diagnosis not present

## 2019-02-15 DIAGNOSIS — E559 Vitamin D deficiency, unspecified: Secondary | ICD-10-CM | POA: Diagnosis not present

## 2019-02-15 DIAGNOSIS — R32 Unspecified urinary incontinence: Secondary | ICD-10-CM | POA: Diagnosis not present

## 2019-02-15 DIAGNOSIS — I10 Essential (primary) hypertension: Secondary | ICD-10-CM | POA: Diagnosis not present

## 2019-02-15 DIAGNOSIS — E118 Type 2 diabetes mellitus with unspecified complications: Secondary | ICD-10-CM | POA: Diagnosis not present

## 2019-02-15 DIAGNOSIS — D649 Anemia, unspecified: Secondary | ICD-10-CM | POA: Diagnosis not present

## 2019-02-15 DIAGNOSIS — M1990 Unspecified osteoarthritis, unspecified site: Secondary | ICD-10-CM | POA: Diagnosis not present

## 2019-03-11 DIAGNOSIS — Z03818 Encounter for observation for suspected exposure to other biological agents ruled out: Secondary | ICD-10-CM | POA: Diagnosis not present

## 2019-04-05 ENCOUNTER — Other Ambulatory Visit: Payer: Self-pay | Admitting: Urology

## 2019-04-05 ENCOUNTER — Other Ambulatory Visit: Payer: Self-pay | Admitting: Interventional Radiology

## 2019-04-05 DIAGNOSIS — N2889 Other specified disorders of kidney and ureter: Secondary | ICD-10-CM

## 2019-04-18 DIAGNOSIS — E118 Type 2 diabetes mellitus with unspecified complications: Secondary | ICD-10-CM | POA: Diagnosis not present

## 2019-04-25 ENCOUNTER — Other Ambulatory Visit: Payer: Self-pay | Admitting: Interventional Radiology

## 2019-04-25 DIAGNOSIS — N2889 Other specified disorders of kidney and ureter: Secondary | ICD-10-CM

## 2019-05-05 DIAGNOSIS — Z853 Personal history of malignant neoplasm of breast: Secondary | ICD-10-CM | POA: Diagnosis not present

## 2019-05-05 DIAGNOSIS — R928 Other abnormal and inconclusive findings on diagnostic imaging of breast: Secondary | ICD-10-CM | POA: Diagnosis not present

## 2019-05-10 ENCOUNTER — Ambulatory Visit (HOSPITAL_COMMUNITY)
Admission: RE | Admit: 2019-05-10 | Discharge: 2019-05-10 | Disposition: A | Discharge status: Home / Self Care | Payer: Medicare PPO | Source: Ambulatory Visit | Attending: Interventional Radiology | Admitting: Interventional Radiology

## 2019-05-10 ENCOUNTER — Other Ambulatory Visit: Payer: Self-pay

## 2019-05-10 DIAGNOSIS — D3501 Benign neoplasm of right adrenal gland: Secondary | ICD-10-CM | POA: Diagnosis not present

## 2019-05-10 DIAGNOSIS — N2889 Other specified disorders of kidney and ureter: Secondary | ICD-10-CM | POA: Diagnosis not present

## 2019-05-10 LAB — CREATININE, SERUM
Creatinine, Ser: 0.7 mg/dL (ref 0.44–1.00)
GFR calc Af Amer: >60 mL/min (ref >60)
GFR calc non Af Amer: >60 mL/min (ref >60)

## 2019-05-10 MED ORDER — GADOBUTROL 1 MMOL/ML IV SOLN
7.0000 mL | Freq: Once | INTRAVENOUS | Status: AC | PRN
  Administered 2019-05-10: 17:00:00 7 mL via INTRAVENOUS

## 2019-05-12 ENCOUNTER — Ambulatory Visit
Admission: RE | Admit: 2019-05-12 | Discharge: 2019-05-12 | Disposition: A | Discharge status: Home / Self Care | Payer: Medicare PPO | Source: Ambulatory Visit | Attending: Interventional Radiology | Admitting: Interventional Radiology

## 2019-05-12 ENCOUNTER — Other Ambulatory Visit: Payer: Self-pay

## 2019-05-12 DIAGNOSIS — N2889 Other specified disorders of kidney and ureter: Secondary | ICD-10-CM | POA: Diagnosis not present

## 2019-05-12 HISTORY — PX: IR RADIOLOGIST EVAL & MGMT: IMG5224

## 2019-05-12 NOTE — Progress Notes (Signed)
Chief Complaint: Follow up of right renal masses.  History of Present Illness: Lori Rodriguez is a 84 y.o. female referred by Dr. Nila Nephew in January for evaluation of right renal masses for possible cryoablation. It was decided at that time to follow up a prior CT with an MRI at 6 months in order to evaluate the lesions for growth. A 1.9 cm solid lesion in the anterior interpolar right kidney was more suspicious for malignancy. There was also a smaller, roughly 1.2 cm lesion of the medial upper right kidney demonstrating possible subtle enhancement by CT.  Follow up MRI was performed on 05/10/19. Lori Rodriguez has some periodic right anterior abdominal discomfort. She has no posterior flank pain or urinary symptoms.  Past Medical History:  Diagnosis Date   Anemia    Anginal pain (HCC)    Atrial fibrillation (HCC)    Cancer (HCC)    Left Breast Cancer   CHF (congestive heart failure) (HCC)    Chronic back pain    Chronic kidney disease    Right Renal Mass   Diabetes mellitus without complication (HCC)    DJD (degenerative joint disease)    Dysrhythmia    Atrial fibrillation   Hypertension    Osteoporosis    Right renal mass    Vitamin B12 deficiency    Vitamin D deficiency     Past Surgical History:  Procedure Laterality Date   BREAST LUMPECTOMY Left    CARDIAC CATHETERIZATION     CATARACT EXTRACTION, BILATERAL     CHOLECYSTECTOMY     IR RADIOLOGIST EVAL & MGMT  09/29/2018   TOTAL HIP ARTHROPLASTY Bilateral     Allergies: Lisinopril, Percocet [oxycodone-acetaminophen], Valium [diazepam], and Prednisone  Medications: Prior to Admission medications   Medication Sig Start Date End Date Taking? Authorizing Provider  albuterol (PROVENTIL HFA;VENTOLIN HFA) 108 (90 Base) MCG/ACT inhaler Inhale 2 puffs into the lungs 4 (four) times daily as needed.    [provider]  anastrozole (ARIMIDEX) 1 MG tablet Take 1 mg by mouth daily.    [provider]  atorvastatin (LIPITOR) 40 MG tablet Take 40 mg by mouth daily. 12/19/16   [provider]  Calcium Carbonate-Vitamin D (CALCIUM-VITAMIN D3) 600-125 MG-UNIT TABS Take 1 capsule by mouth daily.    [provider]  cloNIDine (CATAPRES) 0.2 MG tablet Take 0.2 mg by mouth as needed. If systolic BP greater than 412    [provider]  ferrous sulfate 325 (65 FE) MG tablet Take 325 mg by mouth every other day.     [provider]  fluticasone furoate-vilanterol (BREO ELLIPTA) 100-25 MCG/INH AEPB Place 1 puff into the nose daily. 03/17/18   [provider]  furosemide (LASIX) 20 MG tablet Take 20 mg by mouth daily. Can take second tablet in the evening if weight increases more than 3 lbs 06/06/16   [provider]  glipiZIDE (GLUCOTROL XL) 2.5 MG 24 hr tablet Take 2.5 mg by mouth daily. 12/19/16   [provider]  metFORMIN (GLUCOPHAGE) 1000 MG tablet Take 1,000 mg by mouth 2 (two) times daily.    [provider]  metoprolol tartrate (LOPRESSOR) 100 MG tablet Take 100 mg by mouth 2 (two) times daily. 05/08/16   [provider]  olmesartan (BENICAR) 40 MG tablet Take 40 mg by mouth daily. 03/24/17   [provider]  oxybutynin (DITROPAN-XL) 10 MG 24 hr tablet Take 10 mg by mouth daily.    [provider]  rivaroxaban (XARELTO) 20 MG TABS tablet Take 20 mg by mouth daily. 11/10/17   [provider]  sertraline (ZOLOFT) 100 MG tablet Take 10 mg by mouth daily.    [provider]  traMADol (ULTRAM) 50 MG tablet Take 50 mg by mouth daily as needed. 03/27/17   [provider]  vitamin B-12 (CYANOCOBALAMIN) 1000 MCG tablet Take 2,500 mcg by mouth daily. Take 2 tablets daily    [provider]  Vitamin D, Ergocalciferol, (DRISDOL) 1.25 MG (50000 UT) CAPS capsule Take 50,000 Units by mouth once a week. 06/02/16   [provider]     Family History  Problem Relation  Age of Onset   Heart attack Father    Heart attack Brother    Diabetes Brother    Myasthenia gravis Brother     Social History   Socioeconomic History   Marital status: Widowed    Spouse name: Not on file   Number of children: Not on file   Years of education: Not on file   Highest education level: Not on file  Occupational History   Not on file  Social Needs   Financial resource strain: Not on file   Food insecurity    Worry: Not on file    Inability: Not on file   Transportation needs    Medical: Not on file    Non-medical: Not on file  Tobacco Use   Smoking status: Never Smoker   Smokeless tobacco: Never Used  Substance and Sexual Activity   Alcohol use: Never    Frequency: Never   Drug use: Never   Sexual activity: Not on file  Lifestyle   Physical activity    Days per week: Not on file    Minutes per session: Not on file   Stress: Not on file  Relationships   Social connections    Talks on phone: Not on file    Gets together: Not on file    Attends religious service: Not on file    Active member of club or organization: Not on file    Attends meetings of clubs or organizations: Not on file    Relationship status: Not on file  Other Topics Concern   Not on file  Social History Narrative   Not on file    ECOG Status: 0 - Asymptomatic  Review of Systems  Respiratory: Negative.   Cardiovascular: Negative.   Gastrointestinal: Negative for abdominal distention, constipation, diarrhea, nausea and vomiting.       Periodic right anterior abdominal discomfort.  Genitourinary: Negative.   Musculoskeletal: Negative.   Neurological: Negative.     Review of Systems: A 12 point ROS discussed and pertinent positives are indicated in the HPI above.  All other systems are negative.  Physical Exam No direct physical exam was performed (except for noted visual exam findings with Video Visits).   Vital Signs: There were no vitals taken for  this visit.  Imaging: Mr Abdomen W Wo Contrast  Result Date: 05/11/2019 CLINICAL DATA:  Right renal masses EXAM: MRI ABDOMEN WITHOUT AND WITH CONTRAST TECHNIQUE: Multiplanar multisequence MR imaging of the abdomen was performed both before and after the administration of intravenous contrast. CONTRAST:  7 cc Gadavist COMPARISON:  Multiple exams, including CT from 08/05/2018 and MRI from 07/22/2018 FINDINGS: Lower chest: Mild cardiomegaly. Hepatobiliary: Cholecystectomy. No significant abnormal enhancement of the hepatic parenchyma. No significant biliary dilatation. Pancreas: Mm T2 hyperintense lesion in the pancreatic tail on image 15/6, stable  by my measurements compared to prior MRI. 3 mm cystic lesion in the pancreatic body close to the dorsal pancreatic duct on image 17/6. Bilobed 0.8 by 0.4 cm cystic lesion the junction of the pancreatic head and uncinate process on image 19/6, stable. Stable 5 by 3 mm cystic lesion of the pancreatic head on image 22/6, stable. Spleen:  Unremarkable Adrenals/Urinary Tract:  Small right adrenal adenoma. There are 2 solid enhancing right renal masses. One of these is in the right kidney upper pole medially and measures 1.6 by 1.3 cm on image 46/15, best compared on the 08/05/2018 CT where the lesion measured 1.5 by 1.1 cm by my measurements. The other lesion is located anteriorly in the mid kidney on image 56/15, measuring 2.1 by 2.0 cm and by my measurement previously 2.1 by 1.9 cm. Please note that in taking these measurements, the comparing across 2 different modalities. On the CT, the hypoenhancing center is better seen than the peripherally enhancing rim, and I attempted to compensate for this in my measurements by including a small enhancing rim on the CT measurements. Bosniak category 1 left kidney lower pole exophytic cyst. Stomach/Bowel: Unremarkable Vascular/Lymphatic: Aortoiliac atherosclerotic vascular disease. No pathologic adenopathy. No tumor thrombus in the  right renal vein. Other:  No supplemental non-categorized findings. Musculoskeletal: Degenerative endplate findings at F6-4. IMPRESSION: 1. Two solid enhancing masses of the right mid kidney are present. The right mid kidney mass anteriorly is essentially stable compared to the CT of 08/05/2018, measuring at 2.1 by 2.0 cm. The right kidney upper pole lesion medially has minimally increased in size, currently 1.6 by 1.3 cm and previously 1.5 by 1.1 cm. Of the previous exams, these lesions are most conspicuous and best measured on the contrast-enhanced CT of 08/05/2018 which is used as comparison. There are some differences in comparing across modalities, for example increased conspicuity of the enhancing rim of both lesions on MRI compared to CT. I attempted to compensate for this in my measurements. 2. No findings of adenopathy or tumor thrombus in the right renal vein. 3. Small right adrenal adenoma. 4.  Aortic Atherosclerosis (ICD10-I70.0). 5. Four tiny cystic lesions are present along the pancreatic parenchyma. These may reflect small postinflammatory cystic lesions although intraductal papillary mucinous neoplasm is not readily excluded. We have currently established 10 months of stability. Given the size of the lesions and the patient's age, follow up pancreatic protocol MRI in 2 years time is recommended. This recommendation follows ACR consensus guidelines: Management of Incidental Pancreatic Cysts: A White Paper of the ACR Incidental Findings Committee. [place] [id number]. Electronically Signed   By: Van Clines M.D.   On: 05/11/2019 08:44    Labs:  CBC: No results for input(s): WBC, HGB, HCT, PLT in the last 8760 hours.  COAGS: No results for input(s): INR, APTT in the last 8760 hours.  BMP: Recent Labs    05/10/19 1620  CREATININE 0.70  GFRNONAA >60  GFRAA >60    Assessment and Plan:  I spoke with Lori Rodriguez and her daughter Lori Rodriguez by phone and reviewed MRI  findings with her. The 1.9-2 cm solid, enhancing anterior interpolar right renal mass is stable in size since prior CT on 08/05/2018. The medial right upper pole lesion is stable to minimally increased in size, measuring 1.4-1.5 cm by my measurements. Given stability and her age, I told Lori Rodriguez that it would be reasonable to continue to follow the right renal lesions. She would like to follow them  and does not desire to undergo ablation unless it is felt to be necessary. I recommended a follow up MRI in 12 months. She is agreeable.   Electronically Signed: Azzie Roup 05/12/2019, 9:48 AM     I spent a total of 15 Minutes in remote  clinical consultation, greater than 50% of which was counseling/coordinating care for right renal masses.    Visit type: Audio only (telephone). Audio (no video) only due to patient's lack of internet/smartphone capability. Alternative for in-person consultation at [facility], [address] [facility], [place], [place]. This visit type was conducted due to national recommendations for restrictions regarding the COVID-19 Pandemic (e.g. social distancing).  This format is felt to be most appropriate for this patient at this time.  All issues noted in this document were discussed and addressed.

## 2019-05-16 DIAGNOSIS — Z170 Estrogen receptor positive status [ER+]: Secondary | ICD-10-CM | POA: Diagnosis not present

## 2019-05-16 DIAGNOSIS — C50112 Malignant neoplasm of central portion of left female breast: Secondary | ICD-10-CM | POA: Diagnosis not present

## 2019-05-19 DIAGNOSIS — E118 Type 2 diabetes mellitus with unspecified complications: Secondary | ICD-10-CM | POA: Diagnosis not present

## 2019-05-19 DIAGNOSIS — N2889 Other specified disorders of kidney and ureter: Secondary | ICD-10-CM | POA: Diagnosis not present

## 2019-05-19 DIAGNOSIS — Z79899 Other long term (current) drug therapy: Secondary | ICD-10-CM | POA: Diagnosis not present

## 2019-05-19 DIAGNOSIS — E559 Vitamin D deficiency, unspecified: Secondary | ICD-10-CM | POA: Diagnosis not present

## 2019-05-19 DIAGNOSIS — D649 Anemia, unspecified: Secondary | ICD-10-CM | POA: Diagnosis not present

## 2019-05-19 DIAGNOSIS — J449 Chronic obstructive pulmonary disease, unspecified: Secondary | ICD-10-CM | POA: Diagnosis not present

## 2019-05-19 DIAGNOSIS — E785 Hyperlipidemia, unspecified: Secondary | ICD-10-CM | POA: Diagnosis not present

## 2019-05-19 DIAGNOSIS — I471 Supraventricular tachycardia: Secondary | ICD-10-CM | POA: Diagnosis not present

## 2019-05-19 DIAGNOSIS — I4891 Unspecified atrial fibrillation: Secondary | ICD-10-CM | POA: Diagnosis not present

## 2019-05-19 DIAGNOSIS — I50810 Right heart failure, unspecified: Secondary | ICD-10-CM | POA: Diagnosis not present

## 2019-06-26 NOTE — Progress Notes (Signed)
Cardiology Office Note:    Date:  06/28/2019   ID:  Lori Rodriguez, DOB [birth year], MRN [id number]  PCP:  Ocie Doyne., MD  Cardiologist:  Shirlee More, MD    Referring MD: Ocie Doyne., MD    ASSESSMENT:    1. Persistent atrial fibrillation ([place])   2. Long term (current) use of anticoagulants   3. Hypertensive heart disease with chronic diastolic congestive heart failure ([place])   4. Orthostatic hypotension    PLAN:    In order of problems listed above:  1. Stable rate is controlled continue beta-blocker and anticoagulant with moderate stroke risk 2. Continue current anticoagulant 3. Stable I discussed with her to check her blood pressure later in the day at rest for 5 minutes and a goal blood pressure in her case of the 1 Q000111Q 50 systolic to avoid symptomatic orthostatic hypotension.  I encouraged her to stop and rest before standing and told her I would take clonidine only if she has sustained systolic blood pressure greater than 180.   Next appointment: 6 months   Medication Adjustments/Labs and Tests Ordered: Current medicines are reviewed at length with the patient today.  Concerns regarding medicines are outlined above.  No orders of the defined types were placed in this encounter.  Meds ordered this encounter  Medications  . cloNIDine (CATAPRES) 0.1 MG tablet    Sig: Take 1 tablet daily as needed if systolic BP (top number) is greater than 180.    Dispense:  30 tablet    Refill:  2    Chief Complaint  Patient presents with  . Follow-up  . Hypertension  . Hypotension    History of Present Illness:    Lori Rodriguez is a 84 y.o. female with a hx of chronic diastolic heart failure persistent atrial fibrillation anticoagulated dyslipidemia hypertension with orthostatic hypotension   last seen 12/02/2018. Compliance with diet, lifestyle and medications: Yes  Overall is done well no edema shortness of breath chest pain palpitation or syncope.  She finds her  self when she stands to be lightheaded momentarily no falls she is checking home blood pressure is doing it first in the morning before meds he gets numbers in the range usually 1 Q000111Q 70 systolic.  In my office today her blood pressure falls to 120/60 standing. Past Medical History:  Diagnosis Date  . Anemia   . Anginal pain ([place])   . Atrial fibrillation ([place])   . Cancer [facility])    Left Breast Cancer  . CHF (congestive heart failure) ([place])   . Chronic back pain   . Chronic kidney disease    Right Renal Mass  . Diabetes mellitus without complication ([place])   . DJD (degenerative joint disease)   . Dysrhythmia    Atrial fibrillation  . Hypertension   . Osteoporosis   . Right renal mass   . Vitamin B12 deficiency   . Vitamin D deficiency     Past Surgical History:  Procedure Laterality Date  . BREAST LUMPECTOMY Left   . CARDIAC CATHETERIZATION    . CATARACT EXTRACTION, BILATERAL    . CHOLECYSTECTOMY    . IR RADIOLOGIST EVAL & MGMT  09/29/2018  . IR RADIOLOGIST EVAL & MGMT  05/12/2019  . TOTAL HIP ARTHROPLASTY Bilateral     Current Medications: Current Meds  Medication Sig  . albuterol (PROVENTIL HFA;VENTOLIN HFA) 108 (90 Base) MCG/ACT inhaler Inhale 2 puffs into the lungs 4 (four) times daily as needed.  Marland Kitchen  atorvastatin (LIPITOR) 40 MG tablet Take 40 mg by mouth daily.  . Calcium Carbonate-Vitamin D (CALCIUM-VITAMIN D3) 600-125 MG-UNIT TABS Take 1 capsule by mouth daily.  . cloNIDine (CATAPRES) 0.1 MG tablet Take 1 tablet daily as needed if systolic BP (top number) is greater than 180.  Marland Kitchen COLCRYS 0.6 MG tablet TAKE ONE TABLET BY MOUTH FOUR TIMES DAILY AS NEEDED FOR GOUT  . diclofenac sodium (VOLTAREN) 1 % GEL APPLY FOUR grams UP TO FOUR TIMES DAILY  . ferrous sulfate 325 (65 FE) MG tablet Take 325 mg by mouth every other day.   . fluticasone furoate-vilanterol (BREO ELLIPTA) 100-25 MCG/INH AEPB Place 1 puff into the nose daily.  . furosemide (LASIX) 20 MG tablet Take 20 mg by mouth  daily. Can take second tablet in the evening if weight increases more than 3 lbs  . glipiZIDE (GLUCOTROL XL) 5 MG 24 hr tablet Take 5 mg by mouth 2 (two) times daily.   . metFORMIN (GLUCOPHAGE) 1000 MG tablet Take 1,000 mg by mouth 2 (two) times daily.  . metoprolol tartrate (LOPRESSOR) 100 MG tablet Take 100 mg by mouth 2 (two) times daily.  Marland Kitchen olmesartan (BENICAR) 40 MG tablet Take 40 mg by mouth daily.  Marland Kitchen oxybutynin (DITROPAN-XL) 10 MG 24 hr tablet Take 10 mg by mouth daily.  . rivaroxaban (XARELTO) 20 MG TABS tablet Take 20 mg by mouth daily.  . sertraline (ZOLOFT) 100 MG tablet Take 100 mg by mouth daily.   . traMADol (ULTRAM) 50 MG tablet Take 50 mg by mouth daily as needed.  . vitamin B-12 (CYANOCOBALAMIN) 1000 MCG tablet Take 2,500 mcg by mouth daily. Take 2 tablets daily  . Vitamin D, Ergocalciferol, (DRISDOL) 1.25 MG (50000 UT) CAPS capsule Take 50,000 Units by mouth once a week.  . [DISCONTINUED] cloNIDine (CATAPRES) 0.2 MG tablet Take 0.2 mg by mouth as needed. If systolic BP greater than 0000000     Allergies:   Lisinopril, Percocet [oxycodone-acetaminophen], Valium [diazepam], and Prednisone   Social History   Socioeconomic History  . Marital status: Widowed    Spouse name: Not on file  . Number of children: Not on file  . Years of education: Not on file  . Highest education level: Not on file  Occupational History  . Not on file  Social Needs  . Financial resource strain: Not on file  . Food insecurity    Worry: Not on file    Inability: Not on file  . Transportation needs    Medical: Not on file    Non-medical: Not on file  Tobacco Use  . Smoking status: Never Smoker  . Smokeless tobacco: Never Used  Substance and Sexual Activity  . Alcohol use: Never    Frequency: Never  . Drug use: Never  . Sexual activity: Not on file  Lifestyle  . Physical activity    Days per week: Not on file    Minutes per session: Not on file  . Stress: Not on file  Relationships  .  Social Herbalist on phone: Not on file    Gets together: Not on file    Attends religious service: Not on file    Active member of club or organization: Not on file    Attends meetings of clubs or organizations: Not on file    Relationship status: Not on file  Other Topics Concern  . Not on file  Social History Narrative  . Not on file  Family History: The patient's family history includes Diabetes in her brother; Heart attack in her brother and father; Myasthenia gravis in her brother. ROS:   Please see the history of present illness.    All other systems reviewed and are negative.  EKGs/Labs/Other Studies Reviewed:    The following studies were reviewed today:  Recent Labs: 05/10/2019: Creatinine, Ser 0.70  Recent Lipid Panel No results found for: CHOL, TRIG, HDL, CHOLHDL, VLDL, LDLCALC, LDLDIRECT  Physical Exam:    VS:  BP (!) 150/74 (BP Location: Right Arm, Patient Position: Sitting, Cuff Size: Normal)   Pulse 84   Ht 5\' 7"  (1.702 m)   Wt 197 lb 12.8 oz (89.7 kg)   SpO2 94%   BMI 30.98 kg/m     Wt Readings from Last 3 Encounters:  06/28/19 197 lb 12.8 oz (89.7 kg)  12/02/18 192 lb 12.8 oz (87.5 kg)  09/29/18 187 lb (84.8 kg)     GEN:  Well nourished, well developed in no acute distress HEENT: Normal NECK: No JVD; No carotid bruits LYMPHATICS: No lymphadenopathy CARDIAC: Irregular rhythm variable first heart sound  no murmurs, rubs, gallops RESPIRATORY:  Clear to auscultation without rales, wheezing or rhonchi  ABDOMEN: Soft, non-tender, non-distended MUSCULOSKELETAL:  No edema; No deformity  SKIN: Warm and dry NEUROLOGIC:  Alert and oriented x 3 PSYCHIATRIC:  Normal affect    Signed, Shirlee More, MD  06/28/2019 3:33 PM    [facility]

## 2019-06-28 ENCOUNTER — Other Ambulatory Visit: Payer: Self-pay

## 2019-06-28 ENCOUNTER — Ambulatory Visit (INDEPENDENT_AMBULATORY_CARE_PROVIDER_SITE_OTHER): Payer: Medicare PPO | Admitting: Cardiology

## 2019-06-28 ENCOUNTER — Encounter: Payer: Self-pay | Admitting: Cardiology

## 2019-06-28 VITALS — BP 150/74 | HR 84 | Ht 67.0 in | Wt 197.8 lb

## 2019-06-28 DIAGNOSIS — I5032 Chronic diastolic (congestive) heart failure: Secondary | ICD-10-CM

## 2019-06-28 DIAGNOSIS — I110 Hypertensive heart disease with heart failure: Secondary | ICD-10-CM | POA: Diagnosis not present

## 2019-06-28 DIAGNOSIS — I951 Orthostatic hypotension: Secondary | ICD-10-CM | POA: Diagnosis not present

## 2019-06-28 DIAGNOSIS — Z7901 Long term (current) use of anticoagulants: Secondary | ICD-10-CM

## 2019-06-28 DIAGNOSIS — I4819 Other persistent atrial fibrillation: Secondary | ICD-10-CM | POA: Diagnosis not present

## 2019-06-28 MED ORDER — CLONIDINE HCL 0.1 MG PO TABS: ORAL_TABLET | ORAL | 2 refills | Status: DC

## 2019-06-28 NOTE — Patient Instructions (Addendum)
Medication Instructions:  Your physician has recommended you make the following change in your medication:   CHANGE clonidine (catapres) 0.1 mg: Take 1 tablet daily as needed if systolic BP (top number) is greater than 180.  If you need a refill on your cardiac medications before your next appointment, please call your pharmacy.   Lab work: None  If you have labs (blood work) drawn today and your tests are completely normal, you will receive your results only by: Marland Kitchen MyChart Message (if you have MyChart) OR . A paper copy in the mail If you have any lab test that is abnormal or we need to change your treatment, we will call you to review the results.  Testing/Procedures: None  Follow-Up: At [facility], you and your health needs are our priority.  As part of our continuing mission to provide you with exceptional heart care, we have created designated Provider Care Teams.  These Care Teams include your primary Cardiologist (physician) and Advanced Practice Providers (APPs -  Physician Assistants and Nurse Practitioners) who all work together to provide you with the care you need, when you need it. . You will need a follow up appointment in 6 months.  Please call our office 2 months in advance to schedule this appointment.   Any Other Special Instructions Will Be Listed Below (If Applicable).  Check your blood pressure twice daily in the morning and midday and record these readings. Bring your BP log to your next appointment for Dr. Bettina Gavia to review.   [facility]  Tips to measure your blood pressure correctly  To determine whether you have hypertension, a medical professional will take a blood pressure reading. How you prepare for the test, the position of your arm, and other factors can change a blood pressure reading by 10% or more. That could be enough to hide high blood pressure, start you on a drug you don't really need, or lead your doctor to incorrectly adjust your  medications. National and international guidelines offer specific instructions for measuring blood pressure. If a doctor, nurse, or medical assistant isn't doing it right, don't hesitate to ask him or her to get with the guidelines. Here's what you can do to ensure a correct reading: . Don't drink a caffeinated beverage or smoke during the 30 minutes before the test. . Sit quietly for five minutes before the test begins. . During the measurement, sit in a chair with your feet on the floor and your arm supported so your elbow is at about heart level. . The inflatable part of the cuff should completely cover at least 80% of your upper arm, and the cuff should be placed on bare skin, not over a shirt. . Don't talk during the measurement. . Have your blood pressure measured twice, with a brief break in between. If the readings are different by 5 points or more, have it done a third time. There are times to break these rules. If you sometimes feel lightheaded when getting out of bed in the morning or when you stand after sitting, you should have your blood pressure checked while seated and then while standing to see if it falls from one position to the next. Because blood pressure varies throughout the day, your doctor will rarely diagnose hypertension on the basis of a single reading. Instead, he or she will want to confirm the measurements on at least two occasions, usually within a few weeks of one another. The exception to this rule is if you  have a blood pressure reading of 180/110 mm Hg or higher. A result this high usually calls for prompt treatment. It's also a good idea to have your blood pressure measured in both arms at least once, since the reading in one arm (usually the right) may be higher than that in the left. A 2014 study in The American Journal of Medicine of nearly 3,400 people found average arm- to-arm differences in systolic blood pressure of about 5 points. The higher number should be  used to make treatment decisions. In 2017, new guidelines from the [place], the [employer] of Cardiology, and nine other health organizations lowered the diagnosis of high blood pressure to 130/80 mm Hg or higher for all adults. The guidelines also redefined the various blood pressure categories to now include normal, elevated, Stage 1 hypertension, Stage 2 hypertension, and hypertensive crisis (see "Blood pressure categories"). Blood pressure categories  Blood pressure category SYSTOLIC (upper number)  DIASTOLIC (lower number)  Normal Less than 120 mm Hg and Less than 80 mm Hg  Elevated 120-129 mm Hg and Less than 80 mm Hg  High blood pressure: Stage 1 hypertension 130-139 mm Hg or 80-89 mm Hg  High blood pressure: Stage 2 hypertension 140 mm Hg or higher or 90 mm Hg or higher  Hypertensive crisis (consult your doctor immediately) Higher than 180 mm Hg and/or Higher than 120 mm Hg  Source: American Heart Association and American Stroke Association. For more on getting your blood pressure under control, buy Controlling Your Blood Pressure, a Special Health Report from [facility].

## 2019-08-22 DIAGNOSIS — R32 Unspecified urinary incontinence: Secondary | ICD-10-CM | POA: Diagnosis not present

## 2019-08-22 DIAGNOSIS — F329 Major depressive disorder, single episode, unspecified: Secondary | ICD-10-CM | POA: Diagnosis not present

## 2019-08-22 DIAGNOSIS — Z23 Encounter for immunization: Secondary | ICD-10-CM | POA: Diagnosis not present

## 2019-08-22 DIAGNOSIS — E559 Vitamin D deficiency, unspecified: Secondary | ICD-10-CM | POA: Diagnosis not present

## 2019-08-22 DIAGNOSIS — I471 Supraventricular tachycardia: Secondary | ICD-10-CM | POA: Diagnosis not present

## 2019-08-22 DIAGNOSIS — M1990 Unspecified osteoarthritis, unspecified site: Secondary | ICD-10-CM | POA: Diagnosis not present

## 2019-08-22 DIAGNOSIS — D649 Anemia, unspecified: Secondary | ICD-10-CM | POA: Diagnosis not present

## 2019-08-22 DIAGNOSIS — Z79899 Other long term (current) drug therapy: Secondary | ICD-10-CM | POA: Diagnosis not present

## 2019-08-22 DIAGNOSIS — E785 Hyperlipidemia, unspecified: Secondary | ICD-10-CM | POA: Diagnosis not present

## 2019-08-22 DIAGNOSIS — R197 Diarrhea, unspecified: Secondary | ICD-10-CM | POA: Diagnosis not present

## 2019-08-22 DIAGNOSIS — E118 Type 2 diabetes mellitus with unspecified complications: Secondary | ICD-10-CM | POA: Diagnosis not present

## 2019-08-22 DIAGNOSIS — I10 Essential (primary) hypertension: Secondary | ICD-10-CM | POA: Diagnosis not present

## 2019-11-23 DIAGNOSIS — Z853 Personal history of malignant neoplasm of breast: Secondary | ICD-10-CM | POA: Diagnosis not present

## 2019-11-25 DIAGNOSIS — E559 Vitamin D deficiency, unspecified: Secondary | ICD-10-CM | POA: Diagnosis not present

## 2019-11-25 DIAGNOSIS — D649 Anemia, unspecified: Secondary | ICD-10-CM | POA: Diagnosis not present

## 2019-11-25 DIAGNOSIS — I10 Essential (primary) hypertension: Secondary | ICD-10-CM | POA: Diagnosis not present

## 2019-11-25 DIAGNOSIS — I50810 Right heart failure, unspecified: Secondary | ICD-10-CM | POA: Diagnosis not present

## 2019-11-25 DIAGNOSIS — Z79899 Other long term (current) drug therapy: Secondary | ICD-10-CM | POA: Diagnosis not present

## 2019-11-25 DIAGNOSIS — Z139 Encounter for screening, unspecified: Secondary | ICD-10-CM | POA: Diagnosis not present

## 2019-11-25 DIAGNOSIS — I4891 Unspecified atrial fibrillation: Secondary | ICD-10-CM | POA: Diagnosis not present

## 2019-11-25 DIAGNOSIS — E118 Type 2 diabetes mellitus with unspecified complications: Secondary | ICD-10-CM | POA: Diagnosis not present

## 2019-11-25 DIAGNOSIS — J449 Chronic obstructive pulmonary disease, unspecified: Secondary | ICD-10-CM | POA: Diagnosis not present

## 2019-11-25 DIAGNOSIS — E785 Hyperlipidemia, unspecified: Secondary | ICD-10-CM | POA: Diagnosis not present

## 2019-12-06 DIAGNOSIS — E041 Nontoxic single thyroid nodule: Secondary | ICD-10-CM | POA: Diagnosis not present

## 2019-12-06 DIAGNOSIS — E042 Nontoxic multinodular goiter: Secondary | ICD-10-CM | POA: Diagnosis not present

## 2020-01-23 NOTE — Progress Notes (Signed)
Cardiology Office Note:    Date:  01/24/2020   ID:  Lori Rodriguez, DOB [birth year], MRN [id number]  PCP:  Ocie Doyne., MD (Inactive)  Cardiologist:  Shirlee More, MD    Referring MD: No ref. provider found    ASSESSMENT:    1. Hypertensive heart disease with chronic diastolic congestive heart failure ([place])   2. Orthostatic hypotension   3. Persistent atrial fibrillation ([place])   4. Long term (current) use of anticoagulants    PLAN:    In order of problems listed above:  1. Stable BP at target heart failure is nicely compensated. I asked her to continue her current dose of loop diuretic. Renal function potassium are normal. We discussed the role of an echocardiogram I do not feel it is necessary her ejection fraction had normalized and she is comfortable with this approach. 2. Stable no clinical recurrence 3. Stable atrial fibrillation is nicely heart rate controlled with the beta-blocker she follows vital signs at home daily and is at moderate stroke risk we will continue her direct anticoagulant Xarelto 4. Continue anticoagulant she has had no bleeding complication   Next appointment: 6 months   Medication Adjustments/Labs and Tests Ordered: Current medicines are reviewed at length with the patient today.  Concerns regarding medicines are outlined above.  No orders of the defined types were placed in this encounter.  No orders of the defined types were placed in this encounter.   Chief Complaint  Patient presents with  . Follow-up  . Congestive Heart Failure  . Atrial Fibrillation  . Anticoagulation    History of Present Illness:    Lori Rodriguez is a 85 y.o. female with a hx of chronic diastolic heart failure persistent atrial fibrillation anticoagulated dyslipidemia hypertension with orthostatic hypotension last seen 06/28/2019.  Compliance with diet, lifestyle and medications: Yes  Establish cardiology care. She takes meticulous care of her self please every  day and if her weight goes up 3 pounds takes an extra dose of diuretic last month her weight has been stable blood pressure runs 130-140/80 at home. She has been in atrial fibrillation she is anticoagulated and has had no bleeding complication takes beta-blocker with good heart rate control. With COVID-19 she is quite sedentary would not accept vaccine but has not had palpitation syncope shortness of breath chest pain orthopnea and no bleeding from her anticoagulant. Follows with her PCP on a frequent basis Next lab work about every 3 months. Most recent labs 11/25/2019 has a cholesterol 134 at target HDL 44 LDL 66 A1c elevated 8.3% abnormal creatinine 0.5 her EKG today shows rate controlled atrial fibrillation otherwise normal heart rate 85 bpm Past Medical History:  Diagnosis Date  . Anemia   . Anginal pain ([place])   . Atrial fibrillation ([place])   . Cancer [facility])    Left Breast Cancer  . Cancer of central portion of left female breast ([place]) 03/10/2016  . CHF (congestive heart failure) ([place])   . Chronic back pain   . Chronic kidney disease    Right Renal Mass  . Diabetes mellitus without complication ([place])   . DJD (degenerative joint disease)   . Dysrhythmia    Atrial fibrillation  . Encounter for preprocedural cardiovascular examination 02/28/2016   Formatting of this note might be different from the original. Overview:  Remote normal coronary arteriography and echo 2015 with normal EF% Formatting of this note might be different from the original. Remote normal coronary arteriography and echo 2015 with  normal EF%  . Estrogen receptor positive neoplasm 03/10/2016  . Hypertension   . Hypertensive heart disease 02/28/2016  . Long term (current) use of anticoagulants 02/29/2016   Overview:  Overview:  apixaban Overview:  apixaban  Formatting of this note might be different from the original. Overview:  apixaban Formatting of this note might be different from the original. apixaban  . Mixed hyperlipidemia  02/29/2016  . Orthostatic hypotension 12/01/2018  . Osteoporosis   . Persistent atrial fibrillation ([place]) 02/28/2016   Overview:  Overview:  CHADS2 vasc score= 4  Overview:  CHADS2 vasc score= 4 Overview:  CHADS2 vasc score= 4  Formatting of this note might be different from the original. Overview:  CHADS2 vasc score= 4  Overview:  CHADS2 vasc score= 4 Formatting of this note might be different from the original. CHADS2 vasc score= 4  . Right renal mass   . Secondary pulmonary arterial hypertension ([place]) 11/03/2018  . Type 2 diabetes mellitus ([place]) 02/29/2016  . Vitamin B12 deficiency   . Vitamin D deficiency     Past Surgical History:  Procedure Laterality Date  . BREAST LUMPECTOMY Left   . CARDIAC CATHETERIZATION    . CATARACT EXTRACTION, BILATERAL    . CHOLECYSTECTOMY    . IR RADIOLOGIST EVAL & MGMT  09/29/2018  . IR RADIOLOGIST EVAL & MGMT  05/12/2019  . TOTAL HIP ARTHROPLASTY Bilateral     Current Medications: Current Meds  Medication Sig  . atorvastatin (LIPITOR) 40 MG tablet Take 40 mg by mouth daily.  . Calcium Carbonate-Vitamin D (CALCIUM-VITAMIN D3) 600-125 MG-UNIT TABS Take 1 capsule by mouth daily.  . cloNIDine (CATAPRES) 0.1 MG tablet Take 1 tablet daily as needed if systolic BP (top number) is greater than 180.  . ferrous sulfate 325 (65 FE) MG tablet Take 325 mg by mouth every other day.   . fluticasone furoate-vilanterol (BREO ELLIPTA) 100-25 MCG/INH AEPB Place 1 puff into the nose daily.  . furosemide (LASIX) 20 MG tablet Take 20 mg by mouth daily. Can take second tablet in the evening if weight increases more than 3 lbs  . glipiZIDE (GLUCOTROL XL) 5 MG 24 hr tablet Take 5 mg by mouth 2 (two) times daily.   . metFORMIN (GLUCOPHAGE) 1000 MG tablet Take 1,000 mg by mouth 2 (two) times daily.  . metoprolol tartrate (LOPRESSOR) 100 MG tablet Take 100 mg by mouth 2 (two) times daily.  Marland Kitchen olmesartan (BENICAR) 40 MG tablet Take 40 mg by mouth daily.  Marland Kitchen oxybutynin (DITROPAN-XL) 10 MG  24 hr tablet Take 10 mg by mouth daily.  . rivaroxaban (XARELTO) 20 MG TABS tablet Take 20 mg by mouth daily.  . sertraline (ZOLOFT) 100 MG tablet Take 100 mg by mouth daily.   . traMADol (ULTRAM) 50 MG tablet Take 50 mg by mouth daily as needed.  . vitamin B-12 (CYANOCOBALAMIN) 1000 MCG tablet Take 2,500 mcg by mouth daily. Take 2 tablets daily  . Vitamin D, Ergocalciferol, (DRISDOL) 1.25 MG (50000 UT) CAPS capsule Take 50,000 Units by mouth once a week.     Allergies:   Lisinopril, Percocet [oxycodone-acetaminophen], Valium [diazepam], and Prednisone   Social History   Socioeconomic History  . Marital status: Widowed    Spouse name: Not on file  . Number of children: Not on file  . Years of education: Not on file  . Highest education level: Not on file  Occupational History  . Not on file  Tobacco Use  . Smoking status: Never Smoker  .  Smokeless tobacco: Never Used  Substance and Sexual Activity  . Alcohol use: Never  . Drug use: Never  . Sexual activity: Not on file  Other Topics Concern  . Not on file  Social History Narrative  . Not on file   Social Determinants of Health   Financial Resource Strain:   . Difficulty of Paying Living Expenses:   Food Insecurity:   . Worried About Charity fundraiser in the Last Year:   . Arboriculturist in the Last Year:   Transportation Needs:   . Film/video editor (Medical):   Marland Kitchen Lack of Transportation (Non-Medical):   Physical Activity:   . Days of Exercise per Week:   . Minutes of Exercise per Session:   Stress:   . Feeling of Stress :   Social Connections:   . Frequency of Communication with Friends and Family:   . Frequency of Social Gatherings with Friends and Family:   . Attends Religious Services:   . Active Member of Clubs or Organizations:   . Attends Archivist Meetings:   Marland Kitchen Marital Status:      Family History: The patient's family history includes Diabetes in her brother; Heart attack in her  brother and father; Myasthenia gravis in her brother. ROS:   Please see the history of present illness.    All other systems reviewed and are negative.  EKGs/Labs/Other Studies Reviewed:    The following studies were reviewed today:  EKG:  EKG ordered today and personally reviewed.  The ekg ordered today demonstrates controlled atrial fibrillation  Recent Labs: 05/10/2019: Creatinine, Ser 0.70  Recent Lipid Panel No results found for: CHOL, TRIG, HDL, CHOLHDL, VLDL, LDLCALC, LDLDIRECT  Physical Exam:    VS:  BP 118/60   Pulse 85   Ht 5\' 7"  (1.702 m)   Wt 185 lb (83.9 kg)   BMI 28.98 kg/m     Wt Readings from Last 3 Encounters:  01/24/20 185 lb (83.9 kg)  06/28/19 197 lb 12.8 oz (89.7 kg)  12/02/18 192 lb 12.8 oz (87.5 kg)     GEN:  Well nourished, well developed in no acute distress HEENT: Normal NECK: No JVD; No carotid bruits LYMPHATICS: No lymphadenopathy CARDIAC: Irregular S1 variable no murmur RRR, no murmurs, rubs, gallops RESPIRATORY:  Clear to auscultation without rales, wheezing or rhonchi  ABDOMEN: Soft, non-tender, non-distended MUSCULOSKELETAL:  No edema; No deformity  SKIN: Warm and dry NEUROLOGIC:  Alert and oriented x 3 PSYCHIATRIC:  Normal affect    Signed, Shirlee More, MD  01/24/2020 9:57 AM    [place]

## 2020-01-24 ENCOUNTER — Other Ambulatory Visit: Payer: Self-pay

## 2020-01-24 ENCOUNTER — Ambulatory Visit: Payer: Medicare PPO | Admitting: Cardiology

## 2020-01-24 ENCOUNTER — Encounter: Payer: Self-pay | Admitting: Cardiology

## 2020-01-24 VITALS — BP 118/60 | HR 85 | Ht 67.0 in | Wt 185.0 lb

## 2020-01-24 DIAGNOSIS — I110 Hypertensive heart disease with heart failure: Secondary | ICD-10-CM

## 2020-01-24 DIAGNOSIS — Z7901 Long term (current) use of anticoagulants: Secondary | ICD-10-CM | POA: Diagnosis not present

## 2020-01-24 DIAGNOSIS — I4819 Other persistent atrial fibrillation: Secondary | ICD-10-CM

## 2020-01-24 DIAGNOSIS — I5032 Chronic diastolic (congestive) heart failure: Secondary | ICD-10-CM

## 2020-01-24 DIAGNOSIS — I951 Orthostatic hypotension: Secondary | ICD-10-CM

## 2020-01-24 NOTE — Patient Instructions (Addendum)

## 2020-01-31 DIAGNOSIS — I4891 Unspecified atrial fibrillation: Secondary | ICD-10-CM | POA: Diagnosis not present

## 2020-01-31 DIAGNOSIS — J440 Chronic obstructive pulmonary disease with acute lower respiratory infection: Secondary | ICD-10-CM | POA: Diagnosis not present

## 2020-01-31 DIAGNOSIS — I110 Hypertensive heart disease with heart failure: Secondary | ICD-10-CM | POA: Diagnosis not present

## 2020-01-31 DIAGNOSIS — J181 Lobar pneumonia, unspecified organism: Secondary | ICD-10-CM | POA: Diagnosis not present

## 2020-01-31 DIAGNOSIS — J189 Pneumonia, unspecified organism: Secondary | ICD-10-CM | POA: Diagnosis not present

## 2020-01-31 DIAGNOSIS — I509 Heart failure, unspecified: Secondary | ICD-10-CM | POA: Diagnosis not present

## 2020-01-31 DIAGNOSIS — R509 Fever, unspecified: Secondary | ICD-10-CM | POA: Diagnosis not present

## 2020-02-21 DIAGNOSIS — B9689 Other specified bacterial agents as the cause of diseases classified elsewhere: Secondary | ICD-10-CM | POA: Diagnosis not present

## 2020-02-21 DIAGNOSIS — Z9981 Dependence on supplemental oxygen: Secondary | ICD-10-CM | POA: Diagnosis not present

## 2020-02-21 DIAGNOSIS — I272 Pulmonary hypertension, unspecified: Secondary | ICD-10-CM | POA: Diagnosis not present

## 2020-02-21 DIAGNOSIS — R112 Nausea with vomiting, unspecified: Secondary | ICD-10-CM | POA: Diagnosis not present

## 2020-02-21 DIAGNOSIS — E119 Type 2 diabetes mellitus without complications: Secondary | ICD-10-CM | POA: Diagnosis not present

## 2020-02-21 DIAGNOSIS — R1110 Vomiting, unspecified: Secondary | ICD-10-CM | POA: Diagnosis not present

## 2020-02-21 DIAGNOSIS — E1169 Type 2 diabetes mellitus with other specified complication: Secondary | ICD-10-CM | POA: Diagnosis not present

## 2020-02-21 DIAGNOSIS — G4733 Obstructive sleep apnea (adult) (pediatric): Secondary | ICD-10-CM | POA: Diagnosis not present

## 2020-02-21 DIAGNOSIS — I361 Nonrheumatic tricuspid (valve) insufficiency: Secondary | ICD-10-CM | POA: Diagnosis not present

## 2020-02-21 DIAGNOSIS — I482 Chronic atrial fibrillation, unspecified: Secondary | ICD-10-CM | POA: Diagnosis not present

## 2020-02-21 DIAGNOSIS — I4891 Unspecified atrial fibrillation: Secondary | ICD-10-CM | POA: Diagnosis not present

## 2020-02-21 DIAGNOSIS — R0989 Other specified symptoms and signs involving the circulatory and respiratory systems: Secondary | ICD-10-CM | POA: Diagnosis not present

## 2020-02-21 DIAGNOSIS — E669 Obesity, unspecified: Secondary | ICD-10-CM | POA: Diagnosis not present

## 2020-02-21 DIAGNOSIS — Z7901 Long term (current) use of anticoagulants: Secondary | ICD-10-CM | POA: Diagnosis not present

## 2020-02-21 DIAGNOSIS — E785 Hyperlipidemia, unspecified: Secondary | ICD-10-CM | POA: Diagnosis not present

## 2020-02-21 DIAGNOSIS — C50212 Malignant neoplasm of upper-inner quadrant of left female breast: Secondary | ICD-10-CM | POA: Diagnosis not present

## 2020-02-21 DIAGNOSIS — I10 Essential (primary) hypertension: Secondary | ICD-10-CM | POA: Diagnosis not present

## 2020-02-21 DIAGNOSIS — R000 Tachycardia, unspecified: Secondary | ICD-10-CM | POA: Diagnosis not present

## 2020-02-21 DIAGNOSIS — I5033 Acute on chronic diastolic (congestive) heart failure: Secondary | ICD-10-CM | POA: Diagnosis not present

## 2020-02-21 DIAGNOSIS — I340 Nonrheumatic mitral (valve) insufficiency: Secondary | ICD-10-CM | POA: Diagnosis not present

## 2020-02-21 DIAGNOSIS — J9610 Chronic respiratory failure, unspecified whether with hypoxia or hypercapnia: Secondary | ICD-10-CM | POA: Diagnosis not present

## 2020-02-21 DIAGNOSIS — N390 Urinary tract infection, site not specified: Secondary | ICD-10-CM | POA: Diagnosis not present

## 2020-02-21 DIAGNOSIS — F329 Major depressive disorder, single episode, unspecified: Secondary | ICD-10-CM | POA: Diagnosis not present

## 2020-02-21 DIAGNOSIS — E871 Hypo-osmolality and hyponatremia: Secondary | ICD-10-CM | POA: Diagnosis not present

## 2020-02-21 DIAGNOSIS — C50912 Malignant neoplasm of unspecified site of left female breast: Secondary | ICD-10-CM | POA: Diagnosis not present

## 2020-02-21 DIAGNOSIS — I5030 Unspecified diastolic (congestive) heart failure: Secondary | ICD-10-CM | POA: Diagnosis not present

## 2020-02-21 DIAGNOSIS — I110 Hypertensive heart disease with heart failure: Secondary | ICD-10-CM | POA: Diagnosis not present

## 2020-02-21 DIAGNOSIS — J45909 Unspecified asthma, uncomplicated: Secondary | ICD-10-CM | POA: Diagnosis not present

## 2020-02-21 DIAGNOSIS — I509 Heart failure, unspecified: Secondary | ICD-10-CM | POA: Diagnosis not present

## 2020-02-21 DIAGNOSIS — I071 Rheumatic tricuspid insufficiency: Secondary | ICD-10-CM | POA: Diagnosis not present

## 2020-02-22 DIAGNOSIS — N390 Urinary tract infection, site not specified: Secondary | ICD-10-CM | POA: Diagnosis not present

## 2020-02-22 DIAGNOSIS — I4891 Unspecified atrial fibrillation: Secondary | ICD-10-CM | POA: Diagnosis not present

## 2020-02-22 DIAGNOSIS — F329 Major depressive disorder, single episode, unspecified: Secondary | ICD-10-CM | POA: Diagnosis not present

## 2020-02-22 DIAGNOSIS — E1169 Type 2 diabetes mellitus with other specified complication: Secondary | ICD-10-CM | POA: Diagnosis not present

## 2020-02-23 DIAGNOSIS — F329 Major depressive disorder, single episode, unspecified: Secondary | ICD-10-CM | POA: Diagnosis not present

## 2020-02-23 DIAGNOSIS — I071 Rheumatic tricuspid insufficiency: Secondary | ICD-10-CM

## 2020-02-23 DIAGNOSIS — I4891 Unspecified atrial fibrillation: Secondary | ICD-10-CM | POA: Diagnosis not present

## 2020-02-23 DIAGNOSIS — E785 Hyperlipidemia, unspecified: Secondary | ICD-10-CM

## 2020-02-23 DIAGNOSIS — I10 Essential (primary) hypertension: Secondary | ICD-10-CM

## 2020-02-23 DIAGNOSIS — Z7901 Long term (current) use of anticoagulants: Secondary | ICD-10-CM

## 2020-02-23 DIAGNOSIS — E119 Type 2 diabetes mellitus without complications: Secondary | ICD-10-CM

## 2020-02-23 DIAGNOSIS — E1169 Type 2 diabetes mellitus with other specified complication: Secondary | ICD-10-CM

## 2020-02-23 DIAGNOSIS — I272 Pulmonary hypertension, unspecified: Secondary | ICD-10-CM

## 2020-02-23 DIAGNOSIS — N390 Urinary tract infection, site not specified: Secondary | ICD-10-CM | POA: Diagnosis not present

## 2020-02-23 DIAGNOSIS — I5030 Unspecified diastolic (congestive) heart failure: Secondary | ICD-10-CM | POA: Diagnosis not present

## 2020-02-24 DIAGNOSIS — Z7901 Long term (current) use of anticoagulants: Secondary | ICD-10-CM | POA: Diagnosis not present

## 2020-02-24 DIAGNOSIS — I4891 Unspecified atrial fibrillation: Secondary | ICD-10-CM | POA: Diagnosis not present

## 2020-02-24 DIAGNOSIS — I5030 Unspecified diastolic (congestive) heart failure: Secondary | ICD-10-CM | POA: Diagnosis not present

## 2020-02-24 DIAGNOSIS — F329 Major depressive disorder, single episode, unspecified: Secondary | ICD-10-CM | POA: Diagnosis not present

## 2020-02-24 DIAGNOSIS — N390 Urinary tract infection, site not specified: Secondary | ICD-10-CM | POA: Diagnosis not present

## 2020-02-24 DIAGNOSIS — I272 Pulmonary hypertension, unspecified: Secondary | ICD-10-CM | POA: Diagnosis not present

## 2020-02-24 DIAGNOSIS — E1169 Type 2 diabetes mellitus with other specified complication: Secondary | ICD-10-CM | POA: Diagnosis not present

## 2020-02-25 DIAGNOSIS — C50212 Malignant neoplasm of upper-inner quadrant of left female breast: Secondary | ICD-10-CM

## 2020-02-25 DIAGNOSIS — N390 Urinary tract infection, site not specified: Secondary | ICD-10-CM | POA: Diagnosis not present

## 2020-02-25 DIAGNOSIS — E1169 Type 2 diabetes mellitus with other specified complication: Secondary | ICD-10-CM | POA: Diagnosis not present

## 2020-02-25 DIAGNOSIS — I4891 Unspecified atrial fibrillation: Secondary | ICD-10-CM | POA: Diagnosis not present

## 2020-02-25 DIAGNOSIS — F329 Major depressive disorder, single episode, unspecified: Secondary | ICD-10-CM | POA: Diagnosis not present

## 2020-02-26 DIAGNOSIS — N390 Urinary tract infection, site not specified: Secondary | ICD-10-CM | POA: Diagnosis not present

## 2020-02-26 DIAGNOSIS — F329 Major depressive disorder, single episode, unspecified: Secondary | ICD-10-CM | POA: Diagnosis not present

## 2020-02-26 DIAGNOSIS — I4891 Unspecified atrial fibrillation: Secondary | ICD-10-CM | POA: Diagnosis not present

## 2020-02-26 DIAGNOSIS — E1169 Type 2 diabetes mellitus with other specified complication: Secondary | ICD-10-CM | POA: Diagnosis not present

## 2020-02-27 DIAGNOSIS — F329 Major depressive disorder, single episode, unspecified: Secondary | ICD-10-CM | POA: Diagnosis not present

## 2020-02-27 DIAGNOSIS — N390 Urinary tract infection, site not specified: Secondary | ICD-10-CM | POA: Diagnosis not present

## 2020-02-27 DIAGNOSIS — I4891 Unspecified atrial fibrillation: Secondary | ICD-10-CM | POA: Diagnosis not present

## 2020-02-27 DIAGNOSIS — E1169 Type 2 diabetes mellitus with other specified complication: Secondary | ICD-10-CM | POA: Diagnosis not present

## 2020-02-28 DIAGNOSIS — I4891 Unspecified atrial fibrillation: Secondary | ICD-10-CM | POA: Diagnosis not present

## 2020-02-28 DIAGNOSIS — F329 Major depressive disorder, single episode, unspecified: Secondary | ICD-10-CM | POA: Diagnosis not present

## 2020-02-28 DIAGNOSIS — N390 Urinary tract infection, site not specified: Secondary | ICD-10-CM | POA: Diagnosis not present

## 2020-02-28 DIAGNOSIS — E1169 Type 2 diabetes mellitus with other specified complication: Secondary | ICD-10-CM | POA: Diagnosis not present

## 2020-02-29 DIAGNOSIS — E1169 Type 2 diabetes mellitus with other specified complication: Secondary | ICD-10-CM | POA: Diagnosis not present

## 2020-02-29 DIAGNOSIS — N390 Urinary tract infection, site not specified: Secondary | ICD-10-CM | POA: Diagnosis not present

## 2020-02-29 DIAGNOSIS — I4891 Unspecified atrial fibrillation: Secondary | ICD-10-CM | POA: Diagnosis not present

## 2020-02-29 DIAGNOSIS — F329 Major depressive disorder, single episode, unspecified: Secondary | ICD-10-CM | POA: Diagnosis not present

## 2020-03-01 DIAGNOSIS — E1169 Type 2 diabetes mellitus with other specified complication: Secondary | ICD-10-CM | POA: Diagnosis not present

## 2020-03-01 DIAGNOSIS — N390 Urinary tract infection, site not specified: Secondary | ICD-10-CM | POA: Diagnosis not present

## 2020-03-01 DIAGNOSIS — F329 Major depressive disorder, single episode, unspecified: Secondary | ICD-10-CM | POA: Diagnosis not present

## 2020-03-01 DIAGNOSIS — I4891 Unspecified atrial fibrillation: Secondary | ICD-10-CM | POA: Diagnosis not present

## 2020-03-02 DIAGNOSIS — I4891 Unspecified atrial fibrillation: Secondary | ICD-10-CM | POA: Diagnosis not present

## 2020-03-02 DIAGNOSIS — E1169 Type 2 diabetes mellitus with other specified complication: Secondary | ICD-10-CM | POA: Diagnosis not present

## 2020-03-02 DIAGNOSIS — N390 Urinary tract infection, site not specified: Secondary | ICD-10-CM | POA: Diagnosis not present

## 2020-03-02 DIAGNOSIS — F329 Major depressive disorder, single episode, unspecified: Secondary | ICD-10-CM | POA: Diagnosis not present

## 2020-03-03 DIAGNOSIS — I4891 Unspecified atrial fibrillation: Secondary | ICD-10-CM | POA: Diagnosis not present

## 2020-03-03 DIAGNOSIS — F329 Major depressive disorder, single episode, unspecified: Secondary | ICD-10-CM | POA: Diagnosis not present

## 2020-03-03 DIAGNOSIS — N390 Urinary tract infection, site not specified: Secondary | ICD-10-CM | POA: Diagnosis not present

## 2020-03-03 DIAGNOSIS — E1169 Type 2 diabetes mellitus with other specified complication: Secondary | ICD-10-CM | POA: Diagnosis not present

## 2020-03-04 DIAGNOSIS — F329 Major depressive disorder, single episode, unspecified: Secondary | ICD-10-CM | POA: Diagnosis not present

## 2020-03-04 DIAGNOSIS — I4891 Unspecified atrial fibrillation: Secondary | ICD-10-CM | POA: Diagnosis not present

## 2020-03-04 DIAGNOSIS — N390 Urinary tract infection, site not specified: Secondary | ICD-10-CM | POA: Diagnosis not present

## 2020-03-04 DIAGNOSIS — E1169 Type 2 diabetes mellitus with other specified complication: Secondary | ICD-10-CM | POA: Diagnosis not present

## 2020-03-05 DIAGNOSIS — F329 Major depressive disorder, single episode, unspecified: Secondary | ICD-10-CM | POA: Diagnosis not present

## 2020-03-05 DIAGNOSIS — N390 Urinary tract infection, site not specified: Secondary | ICD-10-CM | POA: Diagnosis not present

## 2020-03-05 DIAGNOSIS — E1169 Type 2 diabetes mellitus with other specified complication: Secondary | ICD-10-CM | POA: Diagnosis not present

## 2020-03-05 DIAGNOSIS — I4891 Unspecified atrial fibrillation: Secondary | ICD-10-CM | POA: Diagnosis not present

## 2020-03-06 DIAGNOSIS — I4891 Unspecified atrial fibrillation: Secondary | ICD-10-CM | POA: Diagnosis not present

## 2020-03-06 DIAGNOSIS — N390 Urinary tract infection, site not specified: Secondary | ICD-10-CM | POA: Diagnosis not present

## 2020-03-06 DIAGNOSIS — F329 Major depressive disorder, single episode, unspecified: Secondary | ICD-10-CM | POA: Diagnosis not present

## 2020-03-06 DIAGNOSIS — E1169 Type 2 diabetes mellitus with other specified complication: Secondary | ICD-10-CM | POA: Diagnosis not present

## 2020-03-13 DIAGNOSIS — D649 Anemia, unspecified: Secondary | ICD-10-CM | POA: Diagnosis not present

## 2020-03-13 DIAGNOSIS — R32 Unspecified urinary incontinence: Secondary | ICD-10-CM | POA: Diagnosis not present

## 2020-03-13 DIAGNOSIS — Z79899 Other long term (current) drug therapy: Secondary | ICD-10-CM | POA: Diagnosis not present

## 2020-03-13 DIAGNOSIS — I50810 Right heart failure, unspecified: Secondary | ICD-10-CM | POA: Diagnosis not present

## 2020-03-13 DIAGNOSIS — I4891 Unspecified atrial fibrillation: Secondary | ICD-10-CM | POA: Diagnosis not present

## 2020-03-13 DIAGNOSIS — I471 Supraventricular tachycardia: Secondary | ICD-10-CM | POA: Diagnosis not present

## 2020-03-13 DIAGNOSIS — I10 Essential (primary) hypertension: Secondary | ICD-10-CM | POA: Diagnosis not present

## 2020-03-13 DIAGNOSIS — E559 Vitamin D deficiency, unspecified: Secondary | ICD-10-CM | POA: Diagnosis not present

## 2020-03-13 DIAGNOSIS — E538 Deficiency of other specified B group vitamins: Secondary | ICD-10-CM | POA: Diagnosis not present

## 2020-03-13 DIAGNOSIS — M1990 Unspecified osteoarthritis, unspecified site: Secondary | ICD-10-CM | POA: Diagnosis not present

## 2020-03-20 DIAGNOSIS — R5383 Other fatigue: Secondary | ICD-10-CM | POA: Diagnosis not present

## 2020-03-20 DIAGNOSIS — I270 Primary pulmonary hypertension: Secondary | ICD-10-CM | POA: Diagnosis not present

## 2020-03-20 DIAGNOSIS — G4733 Obstructive sleep apnea (adult) (pediatric): Secondary | ICD-10-CM | POA: Diagnosis not present

## 2020-03-20 DIAGNOSIS — J4530 Mild persistent asthma, uncomplicated: Secondary | ICD-10-CM | POA: Diagnosis not present

## 2020-04-10 DIAGNOSIS — G4733 Obstructive sleep apnea (adult) (pediatric): Secondary | ICD-10-CM | POA: Diagnosis not present

## 2020-04-12 DIAGNOSIS — I10 Essential (primary) hypertension: Secondary | ICD-10-CM | POA: Diagnosis not present

## 2020-04-12 DIAGNOSIS — I50810 Right heart failure, unspecified: Secondary | ICD-10-CM | POA: Diagnosis not present

## 2020-04-12 DIAGNOSIS — E785 Hyperlipidemia, unspecified: Secondary | ICD-10-CM | POA: Diagnosis not present

## 2020-04-12 DIAGNOSIS — Z1331 Encounter for screening for depression: Secondary | ICD-10-CM | POA: Diagnosis not present

## 2020-04-12 DIAGNOSIS — Z9181 History of falling: Secondary | ICD-10-CM | POA: Diagnosis not present

## 2020-04-12 DIAGNOSIS — F329 Major depressive disorder, single episode, unspecified: Secondary | ICD-10-CM | POA: Diagnosis not present

## 2020-04-12 DIAGNOSIS — E118 Type 2 diabetes mellitus with unspecified complications: Secondary | ICD-10-CM | POA: Diagnosis not present

## 2020-04-12 DIAGNOSIS — J449 Chronic obstructive pulmonary disease, unspecified: Secondary | ICD-10-CM | POA: Diagnosis not present

## 2020-04-12 DIAGNOSIS — I4891 Unspecified atrial fibrillation: Secondary | ICD-10-CM | POA: Diagnosis not present

## 2020-04-16 DIAGNOSIS — J4530 Mild persistent asthma, uncomplicated: Secondary | ICD-10-CM | POA: Diagnosis not present

## 2020-04-16 DIAGNOSIS — G4761 Periodic limb movement disorder: Secondary | ICD-10-CM | POA: Diagnosis not present

## 2020-04-16 DIAGNOSIS — G4733 Obstructive sleep apnea (adult) (pediatric): Secondary | ICD-10-CM | POA: Diagnosis not present

## 2020-04-16 DIAGNOSIS — I272 Pulmonary hypertension, unspecified: Secondary | ICD-10-CM | POA: Diagnosis not present

## 2020-04-16 DIAGNOSIS — R5383 Other fatigue: Secondary | ICD-10-CM | POA: Diagnosis not present

## 2020-04-19 ENCOUNTER — Other Ambulatory Visit: Payer: Self-pay | Admitting: Interventional Radiology

## 2020-04-19 DIAGNOSIS — N2889 Other specified disorders of kidney and ureter: Secondary | ICD-10-CM

## 2020-04-24 ENCOUNTER — Telehealth: Payer: Self-pay

## 2020-04-24 NOTE — Telephone Encounter (Signed)
Called patient's pharmacy in [place] and l[place] for Xanax 0.25mg  PO, #2, no refills.  Per Dr. Kathlene Cote, patient is to take one tablet an hour before the scan and may repeat once after 30 minutes as needed.

## 2020-05-08 DIAGNOSIS — G4733 Obstructive sleep apnea (adult) (pediatric): Secondary | ICD-10-CM | POA: Diagnosis not present

## 2020-05-08 DIAGNOSIS — Z1231 Encounter for screening mammogram for malignant neoplasm of breast: Secondary | ICD-10-CM | POA: Diagnosis not present

## 2020-05-14 DIAGNOSIS — R5383 Other fatigue: Secondary | ICD-10-CM | POA: Diagnosis not present

## 2020-05-14 DIAGNOSIS — G4761 Periodic limb movement disorder: Secondary | ICD-10-CM | POA: Diagnosis not present

## 2020-05-14 DIAGNOSIS — J4530 Mild persistent asthma, uncomplicated: Secondary | ICD-10-CM | POA: Diagnosis not present

## 2020-05-14 DIAGNOSIS — G4733 Obstructive sleep apnea (adult) (pediatric): Secondary | ICD-10-CM | POA: Diagnosis not present

## 2020-05-14 DIAGNOSIS — I272 Pulmonary hypertension, unspecified: Secondary | ICD-10-CM | POA: Diagnosis not present

## 2020-05-18 ENCOUNTER — Ambulatory Visit (HOSPITAL_COMMUNITY)
Admission: RE | Admit: 2020-05-18 | Discharge: 2020-05-18 | Disposition: A | Discharge status: Home / Self Care | Payer: Medicare PPO | Source: Ambulatory Visit | Attending: Interventional Radiology | Admitting: Interventional Radiology

## 2020-05-18 ENCOUNTER — Other Ambulatory Visit: Payer: Self-pay

## 2020-05-18 DIAGNOSIS — N2889 Other specified disorders of kidney and ureter: Secondary | ICD-10-CM | POA: Insufficient documentation

## 2020-05-18 DIAGNOSIS — I700 Atherosclerosis of aorta: Secondary | ICD-10-CM | POA: Diagnosis not present

## 2020-05-18 DIAGNOSIS — N281 Cyst of kidney, acquired: Secondary | ICD-10-CM | POA: Diagnosis not present

## 2020-05-18 DIAGNOSIS — K862 Cyst of pancreas: Secondary | ICD-10-CM | POA: Diagnosis not present

## 2020-05-18 DIAGNOSIS — D3501 Benign neoplasm of right adrenal gland: Secondary | ICD-10-CM | POA: Diagnosis not present

## 2020-05-18 MED ORDER — GADOBUTROL 1 MMOL/ML IV SOLN
8.0000 mL | Freq: Once | INTRAVENOUS | Status: AC | PRN
  Administered 2020-05-18: 8 mL via INTRAVENOUS

## 2020-05-21 DIAGNOSIS — C50112 Malignant neoplasm of central portion of left female breast: Secondary | ICD-10-CM | POA: Diagnosis not present

## 2020-05-21 DIAGNOSIS — Z170 Estrogen receptor positive status [ER+]: Secondary | ICD-10-CM | POA: Diagnosis not present

## 2020-05-22 ENCOUNTER — Other Ambulatory Visit: Payer: Self-pay

## 2020-05-22 ENCOUNTER — Ambulatory Visit
Admission: RE | Admit: 2020-05-22 | Discharge: 2020-05-22 | Disposition: A | Discharge status: Home / Self Care | Payer: Medicare PPO | Source: Ambulatory Visit | Attending: Interventional Radiology | Admitting: Interventional Radiology

## 2020-05-22 DIAGNOSIS — N2889 Other specified disorders of kidney and ureter: Secondary | ICD-10-CM | POA: Diagnosis not present

## 2020-05-22 HISTORY — PX: IR RADIOLOGIST EVAL & MGMT: IMG5224

## 2020-05-22 NOTE — Progress Notes (Signed)
Chief Complaint: Patient was consulted remotely today (TeleHealth) for follow up of right renal lesions.  History of Present Illness: Lori Rodriguez is a 85 y.o. female referred by Dr. Nila Nephew in January, 2020 for evaluation of right renal masses for possible cryoablation. It was decided at that time to follow up a prior CT with an MRI at 6 months in order to evaluate the lesions for growth. A 1.9 cm solid lesion in the anterior interpolar right kidney was more suspicious for malignancy. There was also a smaller, roughly 1.2 cm lesion of the medial upper right kidney demonstrating possible subtle enhancement by CT. Follow up MRI in August, 2020 showed a stable right anterior interpolar lesion and slightly larger medial right upper pole lesion. It was decided to perform another follow up MRI in 12 months.  Lori Rodriguez was recently hospitalized for 2 weeks at [facility] with UTI and sepsis and is still recovering at home. She has no current symptoms other than some weakness and fatigue.  Past Medical History:  Diagnosis Date  . Anemia   . Anginal pain ([place])   . Atrial fibrillation ([place])   . Cancer [facility])    Left Breast Cancer  . Cancer of central portion of left female breast ([place]) 03/10/2016  . CHF (congestive heart failure) ([place])   . Chronic back pain   . Chronic kidney disease    Right Renal Mass  . Diabetes mellitus without complication ([place])   . DJD (degenerative joint disease)   . Dysrhythmia    Atrial fibrillation  . Encounter for preprocedural cardiovascular examination 02/28/2016   Formatting of this note might be different from the original. Overview:  Remote normal coronary arteriography and echo 2015 with normal EF% Formatting of this note might be different from the original. Remote normal coronary arteriography and echo 2015 with normal EF%  . Estrogen receptor positive neoplasm 03/10/2016  . Hypertension   . Hypertensive heart disease 02/28/2016  . Long term  (current) use of anticoagulants 02/29/2016   Overview:  Overview:  apixaban Overview:  apixaban  Formatting of this note might be different from the original. Overview:  apixaban Formatting of this note might be different from the original. apixaban  . Mixed hyperlipidemia 02/29/2016  . Orthostatic hypotension 12/01/2018  . Osteoporosis   . Persistent atrial fibrillation ([place]) 02/28/2016   Overview:  Overview:  CHADS2 vasc score= 4  Overview:  CHADS2 vasc score= 4 Overview:  CHADS2 vasc score= 4  Formatting of this note might be different from the original. Overview:  CHADS2 vasc score= 4  Overview:  CHADS2 vasc score= 4 Formatting of this note might be different from the original. CHADS2 vasc score= 4  . Right renal mass   . Secondary pulmonary arterial hypertension ([place]) 11/03/2018  . Type 2 diabetes mellitus ([place]) 02/29/2016  . Vitamin B12 deficiency   . Vitamin D deficiency     Past Surgical History:  Procedure Laterality Date  . BREAST LUMPECTOMY Left   . CARDIAC CATHETERIZATION    . CATARACT EXTRACTION, BILATERAL    . CHOLECYSTECTOMY    . IR RADIOLOGIST EVAL & MGMT  09/29/2018  . IR RADIOLOGIST EVAL & MGMT  05/12/2019  . TOTAL HIP ARTHROPLASTY Bilateral     Allergies: Lisinopril, Percocet [oxycodone-acetaminophen], Valium [diazepam], and Prednisone  Medications: Prior to Admission medications   Medication Sig Start Date End Date Taking? Authorizing Provider  albuterol (PROVENTIL HFA;VENTOLIN HFA) 108 (90 Base) MCG/ACT inhaler Inhale 2 puffs into  the lungs 4 (four) times daily as needed.    [provider]  atorvastatin (LIPITOR) 40 MG tablet Take 40 mg by mouth daily. 12/19/16   [provider]  Calcium Carbonate-Vitamin D (CALCIUM-VITAMIN D3) 600-125 MG-UNIT TABS Take 1 capsule by mouth daily.    [provider]  cloNIDine (CATAPRES) 0.1 MG tablet Take 1 tablet daily as needed if systolic BP (top number) is greater than 180. 06/28/19   Munley, Hilton Cork, MD    ferrous sulfate 325 (65 FE) MG tablet Take 325 mg by mouth every other day.     [provider]  fluticasone furoate-vilanterol (BREO ELLIPTA) 100-25 MCG/INH AEPB Place 1 puff into the nose daily. 03/17/18   [provider]  furosemide (LASIX) 20 MG tablet Take 20 mg by mouth daily. Can take second tablet in the evening if weight increases more than 3 lbs 06/06/16   [provider]  glipiZIDE (GLUCOTROL XL) 5 MG 24 hr tablet Take 5 mg by mouth 2 (two) times daily.  12/19/16   [provider]  metFORMIN (GLUCOPHAGE) 1000 MG tablet Take 1,000 mg by mouth 2 (two) times daily.    [provider]  metoprolol tartrate (LOPRESSOR) 100 MG tablet Take 100 mg by mouth 2 (two) times daily. 05/08/16   [provider]  olmesartan (BENICAR) 40 MG tablet Take 40 mg by mouth daily. 03/24/17   [provider]  oxybutynin (DITROPAN-XL) 10 MG 24 hr tablet Take 10 mg by mouth daily.    [provider]  rivaroxaban (XARELTO) 20 MG TABS tablet Take 20 mg by mouth daily. 11/10/17   [provider]  sertraline (ZOLOFT) 100 MG tablet Take 100 mg by mouth daily.     [provider]  traMADol (ULTRAM) 50 MG tablet Take 50 mg by mouth daily as needed. 03/27/17   [provider]  vitamin B-12 (CYANOCOBALAMIN) 1000 MCG tablet Take 2,500 mcg by mouth daily. Take 2 tablets daily    [provider]  Vitamin D, Ergocalciferol, (DRISDOL) 1.25 MG (50000 UT) CAPS capsule Take 50,000 Units by mouth once a week. 06/02/16   [provider]     Family History  Problem Relation Age of Onset  . Heart attack Father   . Heart attack Brother   . Diabetes Brother   . Myasthenia gravis Brother     Social History   Socioeconomic History  . Marital status: Widowed    Spouse name: Not on file  . Number of children: Not on file  . Years of education: Not on file  . Highest education level: Not on file  Occupational History  .  Not on file  Tobacco Use  . Smoking status: Never Smoker  . Smokeless tobacco: Never Used  Vaping Use  . Vaping Use: Never used  Substance and Sexual Activity  . Alcohol use: Never  . Drug use: Never  . Sexual activity: Not on file  Other Topics Concern  . Not on file  Social History Narrative  . Not on file   Social Determinants of Health   Financial Resource Strain:   . Difficulty of Paying Living Expenses: Not on file  Food Insecurity:   . Worried About Charity fundraiser in the Last Year: Not on file  . Ran Out of Food in the Last Year: Not on file  Transportation Needs:   . Lack of Transportation (Medical): Not on file  . Lack of Transportation (Non-Medical): Not on  file  Physical Activity:   . Days of Exercise per Week: Not on file  . Minutes of Exercise per Session: Not on file  Stress:   . Feeling of Stress : Not on file  Social Connections:   . Frequency of Communication with Friends and Family: Not on file  . Frequency of Social Gatherings with Friends and Family: Not on file  . Attends Religious Services: Not on file  . Active Member of Clubs or Organizations: Not on file  . Attends Archivist Meetings: Not on file  . Marital Status: Not on file     Review of Systems  Constitutional: Positive for activity change and fatigue.  Respiratory: Negative.   Cardiovascular: Negative.   Gastrointestinal: Negative.   Genitourinary: Negative.   Musculoskeletal: Negative.   Neurological: Positive for weakness.    Review of Systems: A 12 point ROS discussed and pertinent positives are indicated in the HPI above.  All other systems are negative.  Physical Exam No direct physical exam was performed (except for noted visual exam findings with Video Visits).   Vital Signs: There were no vitals taken for this visit.  Imaging: MR ABDOMEN WWO CONTRAST  Result Date: 05/18/2020 CLINICAL DATA:  85 year old female with history of right renal mass. Follow-up  study. EXAM: MRI ABDOMEN WITHOUT AND WITH CONTRAST TECHNIQUE: Multiplanar multisequence MR imaging of the abdomen was performed both before and after the administration of intravenous contrast. CONTRAST:  26mL GADAVIST GADOBUTROL 1 MMOL/ML IV SOLN COMPARISON:  Abdominal MRI 05/10/2019. CT the abdomen and pelvis 02/21/2020. FINDINGS: Lower chest: Mild cardiomegaly. Hepatobiliary: Liver has a slightly shrunken appearance and nodular contour, suggesting underlying cirrhosis. No suspicious cystic or solid hepatic lesions. No intra or extrahepatic biliary ductal dilatation. Status post cholecystectomy. Pancreas: Multiple tiny T1 hypointense, T2 hyperintense, nonenhancing lesions are noted throughout the pancreas, largest of which is in the uncinate process measuring 8 x 5 mm (axial image 24 of series 4). No suspicious solid pancreatic lesions. No peripancreatic fluid collections or inflammatory changes. Spleen: Spleen is mildly enlarged measuring 13.4 x 5.9 x 13.7 cm (estimated splenic volume of 542 mL) . Adrenals/Urinary Tract: Multiple T1 hypointense, T2 hyperintense, nonenhancing lesions are noted in both kidneys compatible with simple cysts, largest of which is exophytic extending off the posterior aspect of the lower pole of the left kidney measuring 5.6 cm. Subcentimeter T1 hyperintense lesion in the lower pole of the right kidney is compatible with a tiny proteinaceous/hemorrhagic cysts. No definite suspicious renal lesions. No hydroureteronephrosis in the visualized portions of the abdomen. Left adrenal gland is normal in appearance. 1.5 x 1.1 cm right adrenal nodule, stable compared to numerous prior examinations demonstrating loss of signal intensity on out of phase dual echo images, indicative of an adrenal adenoma. Stomach/Bowel: Unremarkable. Vascular/Lymphatic: Aortic atherosclerosis, without evidence of aneurysm in the abdominal vasculature. No lymphadenopathy noted in the abdomen. Other: No significant  volume of ascites noted in the visualized portions of the peritoneal cavity. Musculoskeletal: No aggressive appearing osseous lesions are noted in the visualized portions of the skeleton. IMPRESSION: 1. No suspicious renal lesions. Multiple Bosniak class 1 and Bosniak class 2 cysts in the kidneys bilaterally, as above. 2. Small right adrenal adenoma. 3. Morphologic changes in the liver indicative of mild cirrhosis. 4. Multiple tiny subcentimeter cystic lesions in the pancreas, nonspecific, but statistically likely benign. Repeat abdominal MRI with and without IV gadolinium with MRCP is recommended in 2 years to ensure stability of these lesions. This recommendation follows  ACR consensus guidelines: Management of Incidental Pancreatic Cysts: A White Paper of the ACR Incidental Findings Committee. J Am Coll Radiol 6045;40:981-191. 5. Aortic atherosclerosis. Electronically Signed   By: Vinnie Langton M.D.   On: 05/18/2020 14:46    Labs:  CBC: No results for input(s): WBC, HGB, HCT, PLT in the last 8760 hours.  COAGS: No results for input(s): INR, APTT in the last 8760 hours.  BMP: No results for input(s): NA, K, CL, CO2, GLUCOSE, BUN, CALCIUM, CREATININE, GFRNONAA, GFRAA in the last 8760 hours.  Invalid input(s): CMP  LIVER FUNCTION TESTS: No results for input(s): BILITOT, AST, ALT, ALKPHOS, PROT, ALBUMIN in the last 8760 hours.  TUMOR MARKERS: No results for input(s): AFPTM, CEA, CA199, CHROMGRNA in the last 8760 hours.  Assessment and Plan:  I spoke with Lori Rodriguez and Lori Rodriguez over the phone.  We reviewed imaging findings from the follow-up MRI dated 05/18/2020.  This demonstrates involution of the previously noted right anterior interpolar lesion which now appears cystic, measures only approximately 9 mm in diameter and shows no visible enhancement.  The smaller posterior and medial upper pole lesion appears smaller over an area measuring approximately 1 x 1.2 cm.  There do not  appear to be any worrisome renal lesions bilaterally.  Given significant change by MRI, I did not recommend any further routine MRI studies.  I did tell Lori Rodriguez that Dr. Nila Nephew may or may not elect to periodically perform renal ultrasound to follow the kidneys.  At this point, there clearly is no need for biopsy or ablation.  Electronically Signed: Azzie Roup 05/22/2020, 1:54 PM     I spent a total of 10 Minutes in remote  clinical consultation, greater than 50% of which was counseling/coordinating care for right renal lesions.    Visit type: Audio only (telephone). Audio (no video) only due to patient's lack of internet/smartphone capability. Alternative for in-person consultation at [facility], [address], [place], [place]. This visit type was conducted due to national recommendations for restrictions regarding the COVID-19 Pandemic (e.g. social distancing).  This format is felt to be most appropriate for this patient at this time.  All issues noted in this document were discussed and addressed.

## 2020-05-24 NOTE — Progress Notes (Signed)
Cardiology Office Note:    Date:  05/25/2020   ID:  Lori Rodriguez, DOB Aug 27, 1935, MRN [id number]  PCP:  Ocie Doyne., MD (Inactive)  Cardiologist:  Shirlee More, MD    Referring MD: No ref. provider found    ASSESSMENT:    1. Pulmonary arterial hypertension ([place])   2. Persistent atrial fibrillation ([place])   3. Secondary pulmonary arterial hypertension ([place])   4. Orthostatic hypotension   5. Hypertensive heart disease with chronic diastolic congestive heart failure ([place])    PLAN:    In order of problems listed above:  1. Clinically I agree with my partner pulmonary hypertension secondary to decompensated heart failure she is improved I will recheck a proBNP level and echocardiogram in my office. 2. Stable rate is controlled to my office today 84 bpm her care and anticoagulant 3. Recheck BMP echocardiogram 4. Discontinue amlodipine 5. Compensated heart failure continue her current diuretic   Next appointment: 6 weeks   Medication Adjustments/Labs and Tests Ordered: Current medicines are reviewed at length with the patient today.  Concerns regarding medicines are outlined above.  Orders Placed This Encounter  Procedures  . Basic metabolic panel  . Pro b natriuretic peptide (BNP)  . ECHOCARDIOGRAM COMPLETE   No orders of the defined types were placed in this encounter.   No chief complaint on file.   History of Present Illness:    Lori Rodriguez is a 85 y.o. female with a hx of  chronic diastolic heart failure persistent atrial fibrillation anticoagulated dyslipidemia hypertension with orthostatic hypotension last seen 01/24/2020.  She has a history of breast cancer stage Ia with lumpectomy sentinel node biopsy did not receive radiation or chemotherapy is on estrogen blockade. Compliance with diet, lifestyle and medications: Yes  She sees me for several reasons she is having trouble since they gave her extra antihypertensives of standing up  lightheaded and fell on one occasion.  Blood pressure sitting 100/60 standing 100/60 and she will discontinue her calcium channel blocker.  She was at [facility] health admitted 02/21/2020 with decompensated heart failure she was fluid overloaded her echocardiogram showed preserved ejection fraction she had biatrial enlargement was in atrial fibrillation and she had severe pulmonary hypertension.  My partner saw her and felt that the problem was decompensated heart failure.  Subsequently she has been seen by pulmonary medicine she cannot tell me whether or not she has sleep apnea.  Fortunately she is having no edema orthopnea she is short of breath walking outside of the home no chest pain palpitation or syncope and if not for lightheadedness is pleased with the quality of her life her EKG at [place] reviewed 02/22/2020 showed atrial fibrillation rapid ventricular rate 107 bpm.  Laboratory studies at the hospital included a sodium diminished 129 potassium 3.9 creatinine 0.50. Past Medical History:  Diagnosis Date  . Anemia   . Anginal pain ([place])   . Atrial fibrillation ([place])   . Cancer [facility])    Left Breast Cancer  . Cancer of central portion of left female breast ([place]) 03/10/2016  . CHF (congestive heart failure) ([place])   . Chronic back pain   . Chronic kidney disease    Right Renal Mass  . Diabetes mellitus without complication ([place])   . DJD (degenerative joint disease)   . Dysrhythmia    Atrial fibrillation  . Encounter for preprocedural cardiovascular examination 02/28/2016   Formatting of this note might be different from the original. Overview:  Remote normal  coronary arteriography and echo 2015 with normal EF% Formatting of this note might be different from the original. Remote normal coronary arteriography and echo 2015 with normal EF%  . Estrogen receptor positive neoplasm 03/10/2016  . Hypertension   . Hypertensive heart disease 02/28/2016  . Long term (current) use of anticoagulants  02/29/2016   Overview:  Overview:  apixaban Overview:  apixaban  Formatting of this note might be different from the original. Overview:  apixaban Formatting of this note might be different from the original. apixaban  . Mixed hyperlipidemia 02/29/2016  . Orthostatic hypotension 12/01/2018  . Osteoporosis   . Persistent atrial fibrillation ([place]) 02/28/2016   Overview:  Overview:  CHADS2 vasc score= 4  Overview:  CHADS2 vasc score= 4 Overview:  CHADS2 vasc score= 4  Formatting of this note might be different from the original. Overview:  CHADS2 vasc score= 4  Overview:  CHADS2 vasc score= 4 Formatting of this note might be different from the original. CHADS2 vasc score= 4  . Right renal mass   . Secondary pulmonary arterial hypertension ([place]) 11/03/2018  . Type 2 diabetes mellitus ([place]) 02/29/2016  . Vitamin B12 deficiency   . Vitamin D deficiency     Past Surgical History:  Procedure Laterality Date  . BREAST LUMPECTOMY Left   . CARDIAC CATHETERIZATION    . CATARACT EXTRACTION, BILATERAL    . CHOLECYSTECTOMY    . IR RADIOLOGIST EVAL & MGMT  09/29/2018  . IR RADIOLOGIST EVAL & MGMT  05/12/2019  . IR RADIOLOGIST EVAL & MGMT  05/22/2020  . TOTAL HIP ARTHROPLASTY Bilateral     Current Medications: Current Meds  Medication Sig  . albuterol (PROVENTIL HFA;VENTOLIN HFA) 108 (90 Base) MCG/ACT inhaler Inhale 2 puffs into the lungs 4 (four) times daily as needed.  Marland Kitchen atorvastatin (LIPITOR) 40 MG tablet Take 40 mg by mouth daily.  . Calcium Carbonate-Vitamin D (CALCIUM-VITAMIN D3) 600-125 MG-UNIT TABS Take 1 capsule by mouth daily.  . Cyanocobalamin (VITAMIN B-12) 5000 MCG TBDP Take 5,000 mcg by mouth daily. Take 2 tablets daily  . ferrous sulfate 325 (65 FE) MG tablet Take 325 mg by mouth every other day.   . fluticasone furoate-vilanterol (BREO ELLIPTA) 100-25 MCG/INH AEPB Place 1 puff into the nose daily.  . furosemide (LASIX) 20 MG tablet Take 20 mg by mouth daily. Can take second tablet in the evening  if weight increases more than 3 lbs  . glipiZIDE (GLUCOTROL XL) 5 MG 24 hr tablet Take 5 mg by mouth 2 (two) times daily.   . irbesartan (AVAPRO) 300 MG tablet Take 1 tablet by mouth daily.  Marland Kitchen labetalol (NORMODYNE) 200 MG tablet Take 1 tablet by mouth 2 (two) times daily.  . ondansetron (ZOFRAN-ODT) 4 MG disintegrating tablet Take 4 mg by mouth every 6 (six) hours as needed.  . rivaroxaban (XARELTO) 20 MG TABS tablet Take 20 mg by mouth daily.  . sertraline (ZOLOFT) 100 MG tablet Take 100 mg by mouth daily.   Marland Kitchen tolterodine (DETROL LA) 4 MG 24 hr capsule Take 1 capsule by mouth every morning.  . traMADol (ULTRAM) 50 MG tablet Take 50 mg by mouth daily as needed.  . Vitamin D, Ergocalciferol, (DRISDOL) 1.25 MG (50000 UT) CAPS capsule Take 50,000 Units by mouth once a week.  . [DISCONTINUED] amLODipine (NORVASC) 5 MG tablet Take 1 tablet by mouth daily.     Allergies:   Lisinopril, Percocet [oxycodone-acetaminophen], Valium [diazepam], and Prednisone   Social History   Socioeconomic History  .  Marital status: Widowed    Spouse name: Not on file  . Number of children: Not on file  . Years of education: Not on file  . Highest education level: Not on file  Occupational History  . Not on file  Tobacco Use  . Smoking status: Never Smoker  . Smokeless tobacco: Never Used  Vaping Use  . Vaping Use: Never used  Substance and Sexual Activity  . Alcohol use: Never  . Drug use: Never  . Sexual activity: Not on file  Other Topics Concern  . Not on file  Social History Narrative  . Not on file   Social Determinants of Health   Financial Resource Strain:   . Difficulty of Paying Living Expenses: Not on file  Food Insecurity:   . Worried About Charity fundraiser in the Last Year: Not on file  . Ran Out of Food in the Last Year: Not on file  Transportation Needs:   . Lack of Transportation (Medical): Not on file  . Lack of Transportation (Non-Medical): Not on file  Physical Activity:     . Days of Exercise per Week: Not on file  . Minutes of Exercise per Session: Not on file  Stress:   . Feeling of Stress : Not on file  Social Connections:   . Frequency of Communication with Friends and Family: Not on file  . Frequency of Social Gatherings with Friends and Family: Not on file  . Attends Religious Services: Not on file  . Active Member of Clubs or Organizations: Not on file  . Attends Archivist Meetings: Not on file  . Marital Status: Not on file     Family History: The patient's family history includes Diabetes in her brother; Heart attack in her brother and father; Myasthenia gravis in her brother. ROS:   Please see the history of present illness.    All other systems reviewed and are negative.  EKGs/Labs/Other Studies Reviewed:    The following studies were reviewed today:    Physical Exam:    VS:  BP 118/62   Pulse 84   Ht 5\' 7"  (1.702 m)   Wt 180 lb 3.2 oz (81.7 kg)   SpO2 96%   BMI 28.22 kg/m     Wt Readings from Last 3 Encounters:  05/25/20 180 lb 3.2 oz (81.7 kg)  01/24/20 185 lb (83.9 kg)  06/28/19 197 lb 12.8 oz (89.7 kg)     GEN:  Well nourished, well developed in no acute distress HEENT: Normal NECK: No JVD; No carotid bruits LYMPHATICS: No lymphadenopathy CARDIAC: Irregular rhythm S1 variable  no murmurs, rubs, gallops RESPIRATORY:  Clear to auscultation without rales, wheezing or rhonchi  ABDOMEN: Soft, non-tender, non-distended MUSCULOSKELETAL: He has no edema; No deformity  SKIN: Warm and dry NEUROLOGIC:  Alert and oriented x 3 PSYCHIATRIC:  Normal affect    Signed, Shirlee More, MD  05/25/2020 3:35 PM    [facility]

## 2020-05-25 ENCOUNTER — Ambulatory Visit: Payer: Medicare PPO | Admitting: Cardiology

## 2020-05-25 ENCOUNTER — Encounter: Payer: Self-pay | Admitting: Cardiology

## 2020-05-25 ENCOUNTER — Other Ambulatory Visit: Payer: Self-pay

## 2020-05-25 VITALS — BP 118/62 | HR 84 | Ht 67.0 in | Wt 180.2 lb

## 2020-05-25 DIAGNOSIS — I110 Hypertensive heart disease with heart failure: Secondary | ICD-10-CM

## 2020-05-25 DIAGNOSIS — I5032 Chronic diastolic (congestive) heart failure: Secondary | ICD-10-CM

## 2020-05-25 DIAGNOSIS — I2721 Secondary pulmonary arterial hypertension: Secondary | ICD-10-CM

## 2020-05-25 DIAGNOSIS — I4819 Other persistent atrial fibrillation: Secondary | ICD-10-CM

## 2020-05-25 DIAGNOSIS — I951 Orthostatic hypotension: Secondary | ICD-10-CM

## 2020-05-25 NOTE — Patient Instructions (Signed)
Medication Instructions:  Your physician has recommended you make the following change in your medication:  STOP: Amlodipine *If you need a refill on your cardiac medications before your next appointment, please call your pharmacy*   Lab Work: Your physician recommends that you return for lab work in: [place] If you have labs (blood work) drawn today and your tests are completely normal, you will receive your results only by: Marland Kitchen MyChart Message (if you have MyChart) OR . A paper copy in the mail If you have any lab test that is abnormal or we need to change your treatment, we will call you to review the results.   Testing/Procedures: Your physician has requested that you have an echocardiogram. Echocardiography is a painless test that uses sound waves to create images of your heart. It provides your doctor with information about the size and shape of your heart and how well your heart's chambers and valves are working. This procedure takes approximately one hour. There are no restrictions for this procedure.     Follow-Up: At [facility], you and your health needs are our priority.  As part of our continuing mission to provide you with exceptional heart care, we have created designated Provider Care Teams.  These Care Teams include your primary Cardiologist (physician) and Advanced Practice Providers (APPs -  Physician Assistants and Nurse Practitioners) who all work together to provide you with the care you need, when you need it.  We recommend signing up for the patient portal called "MyChart".  Sign up information is provided on this After Visit Summary.  MyChart is used to connect with patients for Virtual Visits (Telemedicine).  Patients are able to view lab/test results, encounter notes, upcoming appointments, etc.  Non-urgent messages can be sent to your provider as well.   To learn more about what you can do with MyChart, go to [email or web address].    Your next  appointment:   6 week(s)  The format for your next appointment:   In Person  Provider:   Shirlee More, MD   Other Instructions

## 2020-05-26 LAB — BASIC METABOLIC PANEL
BUN/Creatinine Ratio: 20 (ref 12–28)
BUN: 18 mg/dL (ref 8–27)
CO2: 25 mmol/L (ref 20–29)
Calcium: 9.4 mg/dL (ref 8.7–10.3)
Chloride: 104 mmol/L (ref 96–106)
Creatinine, Ser: 0.9 mg/dL (ref 0.57–1.00)
GFR calc Af Amer: 68 mL/min/{1.73_m2} (ref >59)
GFR calc non Af Amer: 59 mL/min/{1.73_m2} — ABNORMAL LOW (ref >59)
Glucose: 231 mg/dL — ABNORMAL HIGH (ref 65–99)
Potassium: 4.7 mmol/L (ref 3.5–5.2)
Sodium: 141 mmol/L (ref 134–144)

## 2020-05-26 LAB — PRO B NATRIURETIC PEPTIDE: NT-Pro BNP: 587 pg/mL (ref 0–738)

## 2020-05-29 ENCOUNTER — Telehealth: Payer: Self-pay

## 2020-05-29 ENCOUNTER — Other Ambulatory Visit: Payer: Self-pay

## 2020-05-29 ENCOUNTER — Ambulatory Visit (INDEPENDENT_AMBULATORY_CARE_PROVIDER_SITE_OTHER): Payer: Medicare PPO

## 2020-05-29 DIAGNOSIS — I110 Hypertensive heart disease with heart failure: Secondary | ICD-10-CM | POA: Diagnosis not present

## 2020-05-29 DIAGNOSIS — I4819 Other persistent atrial fibrillation: Secondary | ICD-10-CM | POA: Diagnosis not present

## 2020-05-29 DIAGNOSIS — I2721 Secondary pulmonary arterial hypertension: Secondary | ICD-10-CM

## 2020-05-29 DIAGNOSIS — I5032 Chronic diastolic (congestive) heart failure: Secondary | ICD-10-CM

## 2020-05-29 DIAGNOSIS — I951 Orthostatic hypotension: Secondary | ICD-10-CM

## 2020-05-29 LAB — ECHOCARDIOGRAM COMPLETE
Area-P 1/2: 5.6 cm2
S' Lateral: 2.9 cm

## 2020-05-29 NOTE — Telephone Encounter (Signed)
Spoke with patient regarding results and recommendation.  Patient verbalizes understanding and is agreeable to plan of care. Advised patient to call back with any issues or concerns.  

## 2020-05-29 NOTE — Progress Notes (Signed)
Complete echocardiogram performed.  Jimmy Dayanara Sherrill, RDCS, RVT

## 2020-05-29 NOTE — Telephone Encounter (Signed)
-----   Message from Berniece Salines, DO sent at 05/29/2020  3:19 PM EDT ----- Echo showed normal left ventricular systolic function.  However there is severe pulmonary hypertension.  With moderate tricuspid regurgitation.  Further discussion will be done I gave follow-up visit.

## 2020-05-29 NOTE — Telephone Encounter (Signed)
-----   Message from Berniece Salines, DO sent at 05/29/2020  8:34 AM EDT ----- Blood glucose elevated.  I am not sure what your previous blood glucose level has been.  Otherwise normal lab.

## 2020-06-18 DIAGNOSIS — Z79899 Other long term (current) drug therapy: Secondary | ICD-10-CM | POA: Diagnosis not present

## 2020-06-18 DIAGNOSIS — D649 Anemia, unspecified: Secondary | ICD-10-CM | POA: Diagnosis not present

## 2020-06-18 DIAGNOSIS — E559 Vitamin D deficiency, unspecified: Secondary | ICD-10-CM | POA: Diagnosis not present

## 2020-06-18 DIAGNOSIS — Z23 Encounter for immunization: Secondary | ICD-10-CM | POA: Diagnosis not present

## 2020-06-18 DIAGNOSIS — Z6828 Body mass index (BMI) 28.0-28.9, adult: Secondary | ICD-10-CM | POA: Diagnosis not present

## 2020-06-18 DIAGNOSIS — I4891 Unspecified atrial fibrillation: Secondary | ICD-10-CM | POA: Diagnosis not present

## 2020-06-18 DIAGNOSIS — E785 Hyperlipidemia, unspecified: Secondary | ICD-10-CM | POA: Diagnosis not present

## 2020-06-18 DIAGNOSIS — I471 Supraventricular tachycardia: Secondary | ICD-10-CM | POA: Diagnosis not present

## 2020-06-18 DIAGNOSIS — E118 Type 2 diabetes mellitus with unspecified complications: Secondary | ICD-10-CM | POA: Diagnosis not present

## 2020-06-18 DIAGNOSIS — Z87440 Personal history of urinary (tract) infections: Secondary | ICD-10-CM | POA: Diagnosis not present

## 2020-06-18 DIAGNOSIS — N390 Urinary tract infection, site not specified: Secondary | ICD-10-CM | POA: Diagnosis not present

## 2020-06-28 ENCOUNTER — Telehealth: Payer: Self-pay | Admitting: Cardiology

## 2020-06-28 NOTE — Telephone Encounter (Signed)
[facility] called they would like a copy of the 9/3 notes faxed to them at [phone].

## 2020-06-28 NOTE — Telephone Encounter (Signed)
Notes faxed at this time

## 2020-07-02 DIAGNOSIS — I272 Pulmonary hypertension, unspecified: Secondary | ICD-10-CM | POA: Diagnosis not present

## 2020-07-02 DIAGNOSIS — R5383 Other fatigue: Secondary | ICD-10-CM | POA: Diagnosis not present

## 2020-07-02 DIAGNOSIS — G4761 Periodic limb movement disorder: Secondary | ICD-10-CM | POA: Diagnosis not present

## 2020-07-02 DIAGNOSIS — G4733 Obstructive sleep apnea (adult) (pediatric): Secondary | ICD-10-CM | POA: Diagnosis not present

## 2020-07-02 DIAGNOSIS — J4530 Mild persistent asthma, uncomplicated: Secondary | ICD-10-CM | POA: Diagnosis not present

## 2020-07-05 DIAGNOSIS — M79672 Pain in left foot: Secondary | ICD-10-CM | POA: Diagnosis not present

## 2020-07-11 ENCOUNTER — Other Ambulatory Visit: Payer: Self-pay

## 2020-07-11 DIAGNOSIS — N2889 Other specified disorders of kidney and ureter: Secondary | ICD-10-CM | POA: Insufficient documentation

## 2020-07-11 DIAGNOSIS — I209 Angina pectoris, unspecified: Secondary | ICD-10-CM | POA: Insufficient documentation

## 2020-07-11 DIAGNOSIS — I10 Essential (primary) hypertension: Secondary | ICD-10-CM | POA: Insufficient documentation

## 2020-07-11 DIAGNOSIS — E119 Type 2 diabetes mellitus without complications: Secondary | ICD-10-CM | POA: Insufficient documentation

## 2020-07-11 DIAGNOSIS — G8929 Other chronic pain: Secondary | ICD-10-CM | POA: Insufficient documentation

## 2020-07-11 DIAGNOSIS — N189 Chronic kidney disease, unspecified: Secondary | ICD-10-CM | POA: Insufficient documentation

## 2020-07-11 DIAGNOSIS — E538 Deficiency of other specified B group vitamins: Secondary | ICD-10-CM | POA: Insufficient documentation

## 2020-07-11 DIAGNOSIS — I499 Cardiac arrhythmia, unspecified: Secondary | ICD-10-CM | POA: Insufficient documentation

## 2020-07-11 DIAGNOSIS — I4891 Unspecified atrial fibrillation: Secondary | ICD-10-CM | POA: Insufficient documentation

## 2020-07-11 DIAGNOSIS — M810 Age-related osteoporosis without current pathological fracture: Secondary | ICD-10-CM | POA: Insufficient documentation

## 2020-07-11 DIAGNOSIS — E559 Vitamin D deficiency, unspecified: Secondary | ICD-10-CM | POA: Insufficient documentation

## 2020-07-11 DIAGNOSIS — I509 Heart failure, unspecified: Secondary | ICD-10-CM | POA: Insufficient documentation

## 2020-07-11 DIAGNOSIS — C801 Malignant (primary) neoplasm, unspecified: Secondary | ICD-10-CM | POA: Insufficient documentation

## 2020-07-11 DIAGNOSIS — M1990 Unspecified osteoarthritis, unspecified site: Secondary | ICD-10-CM | POA: Insufficient documentation

## 2020-07-11 DIAGNOSIS — D649 Anemia, unspecified: Secondary | ICD-10-CM | POA: Insufficient documentation

## 2020-07-12 ENCOUNTER — Ambulatory Visit (INDEPENDENT_AMBULATORY_CARE_PROVIDER_SITE_OTHER): Payer: Medicare PPO | Admitting: Cardiology

## 2020-07-12 ENCOUNTER — Encounter: Payer: Self-pay | Admitting: Cardiology

## 2020-07-12 ENCOUNTER — Other Ambulatory Visit: Payer: Self-pay

## 2020-07-12 ENCOUNTER — Ambulatory Visit (INDEPENDENT_AMBULATORY_CARE_PROVIDER_SITE_OTHER): Payer: Medicare PPO

## 2020-07-12 VITALS — BP 99/63 | HR 80 | Ht 67.0 in | Wt 179.8 lb

## 2020-07-12 DIAGNOSIS — Z7901 Long term (current) use of anticoagulants: Secondary | ICD-10-CM | POA: Diagnosis not present

## 2020-07-12 DIAGNOSIS — I2721 Secondary pulmonary arterial hypertension: Secondary | ICD-10-CM

## 2020-07-12 DIAGNOSIS — I110 Hypertensive heart disease with heart failure: Secondary | ICD-10-CM

## 2020-07-12 DIAGNOSIS — I951 Orthostatic hypotension: Secondary | ICD-10-CM | POA: Diagnosis not present

## 2020-07-12 DIAGNOSIS — I5032 Chronic diastolic (congestive) heart failure: Secondary | ICD-10-CM

## 2020-07-12 DIAGNOSIS — I4819 Other persistent atrial fibrillation: Secondary | ICD-10-CM | POA: Diagnosis not present

## 2020-07-12 NOTE — Progress Notes (Signed)
Cardiology Office Note:    Date:  07/12/2020   ID:  Lori Rodriguez, DOB 30-May-1935, MRN [id number]  PCP:  Lori Rodriguez., MD  Cardiologist:  Lori More, MD    Referring MD: No ref. provider found    ASSESSMENT:    1. Persistent atrial fibrillation ([place])   2. Long term (current) use of anticoagulants   3. Hypertensive heart disease with chronic diastolic congestive heart failure ([place])   4. Secondary pulmonary arterial hypertension ([place])   5. Orthostatic hypotension    PLAN:    In order of problems listed above:  1. See discussion below decrease beta-blocker dose 50% 3-day ZIO monitor to screen for significant bradycardia 2. Continue anticoagulant 3. Discontinue ARB reduce beta-blocker continue her current diuretic heart failure appears compensated 4. Secondary to heart failure 5. See discussion reduce antihypertensive medications   Next appointment: 3 months, I will ask her to see her PCP in the next 1 to 2 weeks also check BMP proBNP and CBC today   Medication Adjustments/Labs and Tests Ordered: Current medicines are reviewed at length with the patient today.  Concerns regarding medicines are outlined above.  No orders of the defined types were placed in this encounter.  No orders of the defined types were placed in this encounter.   Chief Complaint  Patient presents with  . Follow-up  . Congestive Heart Failure  . Atrial Fibrillation    History of Present Illness:    Lori Rodriguez is a 85 y.o. female with a hx of chronic diastolic heart failure persistent atrial fibrillation with chronic anticoagulation hypertensive heart disease orthostatic hypotension dyslipidemia and stage Ia breast cancer.  She was last seen 05/25/2020 and had a [place] health admission in June with fluid overload decompensated heart failure echocardiogram showed normal ejection fraction.  Subsequently seen by pulmonary with a new diagnosis of obstructive sleep apnea.   Echocardiogram showed pulmonary artery hypertension felt to be secondary to decompensated heart failure.  Repeat N-terminal proBNP was diminished 587 and echocardiogram show EF of 60 to 65% mild concentric LVH normal right ventricular size and function and moderately elevated pulmonary artery systolic pressure.  There is also moderate tricuspid regurgitation.  Compliance with diet, lifestyle and medications: Yes  Her edema is much better has not needed to take extra diuretic and she is not been short of breath.  She had an episode of gout treated with prednisone at urgent care  6 days ago she got out of bed at nighttime fell had extensive trauma has periorbital ecchymosis and has pain and swelling in her left ankle and cannot bear weight.  At times her home blood pressures in the range of 100 mmHg and she is hypotensive today.  I am concerned that bradycardia or hypotension played a role in her fall and trauma will discontinue her ARB reduce her beta-blocker 50% applied 3-day ZIO monitor I have also asked her to have an x-ray of the ankle I suspect fracture. Past Medical History:  Diagnosis Date  . Anemia   . Anginal pain ([place])   . Atrial fibrillation ([place])   . Cancer [facility])    Left Breast Cancer  . Cancer of central portion of left female breast ([place]) 03/10/2016  . CHF (congestive heart failure) ([place])   . Chronic back pain   . Chronic kidney disease    Right Renal Mass  . Diabetes mellitus without complication ([place])   . DJD (degenerative joint disease)   . Dysrhythmia  Atrial fibrillation  . Encounter for preprocedural cardiovascular examination 02/28/2016   Formatting of this note might be different from the original. Overview:  Remote normal coronary arteriography and echo 2015 with normal EF% Formatting of this note might be different from the original. Remote normal coronary arteriography and echo 2015 with normal EF%  . Estrogen receptor positive neoplasm 03/10/2016  . Hypertension    . Hypertensive heart disease 02/28/2016  . Long term (current) use of anticoagulants 02/29/2016   Overview:  Overview:  apixaban Overview:  apixaban  Formatting of this note might be different from the original. Overview:  apixaban Formatting of this note might be different from the original. apixaban  . Mixed hyperlipidemia 02/29/2016  . Orthostatic hypotension 12/01/2018  . Osteoporosis   . Persistent atrial fibrillation ([place]) 02/28/2016   Overview:  Overview:  CHADS2 vasc score= 4  Overview:  CHADS2 vasc score= 4 Overview:  CHADS2 vasc score= 4  Formatting of this note might be different from the original. Overview:  CHADS2 vasc score= 4  Overview:  CHADS2 vasc score= 4 Formatting of this note might be different from the original. CHADS2 vasc score= 4  . Right renal mass   . Secondary pulmonary arterial hypertension ([place]) 11/03/2018  . Type 2 diabetes mellitus ([place]) 02/29/2016  . Vitamin B12 deficiency   . Vitamin D deficiency     Past Surgical History:  Procedure Laterality Date  . BREAST LUMPECTOMY Left   . CARDIAC CATHETERIZATION    . CATARACT EXTRACTION, BILATERAL    . CHOLECYSTECTOMY    . IR RADIOLOGIST EVAL & MGMT  09/29/2018  . IR RADIOLOGIST EVAL & MGMT  05/12/2019  . IR RADIOLOGIST EVAL & MGMT  05/22/2020  . TOTAL HIP ARTHROPLASTY Bilateral     Current Medications: Current Meds  Medication Sig  . albuterol (PROVENTIL HFA;VENTOLIN HFA) 108 (90 Base) MCG/ACT inhaler Inhale 2 puffs into the lungs 4 (four) times daily as needed.  . Calcium Carbonate-Vitamin D (CALCIUM-VITAMIN D3) 600-125 MG-UNIT TABS Take 1 capsule by mouth daily.  . Cyanocobalamin (VITAMIN B-12) 5000 MCG TBDP Take 5,000 mcg by mouth daily. Take 2 tablets daily  . ferrous sulfate 325 (65 FE) MG tablet Take 325 mg by mouth every other day.   . fluticasone furoate-vilanterol (BREO ELLIPTA) 100-25 MCG/INH AEPB Place 1 puff into the nose daily.  . furosemide (LASIX) 20 MG tablet Take 20 mg by mouth daily. Can take second  tablet in the evening if weight increases Rodriguez than 3 lbs  . glipiZIDE (GLUCOTROL XL) 5 MG 24 hr tablet Take 5 mg by mouth 2 (two) times daily.   Marland Kitchen labetalol (NORMODYNE) 200 MG tablet Take 0.5 tablets by mouth 2 (two) times daily.  . ondansetron (ZOFRAN-ODT) 4 MG disintegrating tablet Take 4 mg by mouth every 6 (six) hours as needed.  . rivaroxaban (XARELTO) 20 MG TABS tablet Take 20 mg by mouth daily.  . rosuvastatin (CRESTOR) 40 MG tablet Take 40 mg by mouth daily.  . sertraline (ZOLOFT) 100 MG tablet Take 100 mg by mouth daily.   Marland Kitchen tolterodine (DETROL LA) 4 MG 24 hr capsule Take 1 capsule by mouth every morning.  . traMADol (ULTRAM) 50 MG tablet Take 50 mg by mouth daily as needed.  . Vitamin D, Ergocalciferol, (DRISDOL) 1.25 MG (50000 UT) CAPS capsule Take 50,000 Units by mouth once a week.  . [DISCONTINUED] irbesartan (AVAPRO) 300 MG tablet Take 1 tablet by mouth daily.     Allergies:   Lisinopril, Percocet [  oxycodone-acetaminophen], Valium [diazepam], and Prednisone   Social History   Socioeconomic History  . Marital status: Widowed    Spouse name: Not on file  . Number of children: Not on file  . Years of education: Not on file  . Highest education level: Not on file  Occupational History  . Not on file  Tobacco Use  . Smoking status: Never Smoker  . Smokeless tobacco: Never Used  Vaping Use  . Vaping Use: Never used  Substance and Sexual Activity  . Alcohol use: Never  . Drug use: Never  . Sexual activity: Not on file  Other Topics Concern  . Not on file  Social History Narrative  . Not on file   Social Determinants of Health   Financial Resource Strain:   . Difficulty of Paying Living Expenses: Not on file  Food Insecurity:   . Worried About Charity fundraiser in the Last Year: Not on file  . Ran Out of Food in the Last Year: Not on file  Transportation Needs:   . Lack of Transportation (Medical): Not on file  . Lack of Transportation (Non-Medical): Not on  file  Physical Activity:   . Days of Exercise per Week: Not on file  . Minutes of Exercise per Session: Not on file  Stress:   . Feeling of Stress : Not on file  Social Connections:   . Frequency of Communication with Friends and Family: Not on file  . Frequency of Social Gatherings with Friends and Family: Not on file  . Attends Religious Services: Not on file  . Active Member of Clubs or Organizations: Not on file  . Attends Archivist Meetings: Not on file  . Marital Status: Not on file     Family History: The patient's family history includes Diabetes in her brother; Heart attack in her brother and father; Myasthenia gravis in her brother. ROS:   Please see the history of present illness.    All other systems reviewed and are negative.  EKGs/Labs/Other Studies Reviewed:    The following studies were reviewed today:    Recent Labs: 05/25/2020: BUN 18; Creatinine, Ser 0.90; NT-Pro BNP 587; Potassium 4.7; Sodium 141  Recent Lipid Panel No results found for: CHOL, TRIG, HDL, CHOLHDL, VLDL, LDLCALC, LDLDIRECT  Physical Exam:    VS:  BP 99/63   Pulse 80   Ht 5\' 7"  (1.702 m)   Wt 179 lb 12.8 oz (81.6 kg)   SpO2 94%   BMI 28.16 kg/m     Wt Readings from Last 3 Encounters:  07/12/20 179 lb 12.8 oz (81.6 kg)  05/25/20 180 lb 3.2 oz (81.7 kg)  01/24/20 185 lb (83.9 kg)     GEN:  Well nourished, well developed in no acute distress HEENT: Normal NECK: No JVD; No carotid bruits LYMPHATICS: No lymphadenopathy CARDIAC: Irregular S1 variable , no murmurs, rubs, gallops RESPIRATORY:  Clear to auscultation without rales, wheezing or rhonchi  ABDOMEN: Soft, non-tender, non-distended MUSCULOSKELETAL:  No edema; No deformity swelling erythema left ankle tender to the touch SKIN: Warm and dry NEUROLOGIC:  Alert and oriented x 3 PSYCHIATRIC:  Normal affect    Signed, Lori More, MD  07/12/2020 3:18 PM    [place]

## 2020-07-12 NOTE — Patient Instructions (Signed)
Medication Instructions:  Your physician has recommended you make the following change in your medication:  STOP: Avapro DECREASE: Labetolol to 0.5 tablet by mouth twice daily.  *If you need a refill on your cardiac medications before your next appointment, please call your pharmacy*   Lab Work: Your physician recommends that you return for lab work in: TODAY CBC, BMP If you have labs (blood work) drawn today and your tests are completely normal, you will receive your results only by: Marland Kitchen MyChart Message (if you have MyChart) OR . A paper copy in the mail If you have any lab test that is abnormal or we need to change your treatment, we will call you to review the results.   Testing/Procedures: We have put in an order for you to have a x-ray done of your Left ankle. We will call you once this is scheduled.   A zio monitor was ordered today. It will remain on for 3 days. You will then return monitor and event diary in provided box. It takes 1-2 weeks for report to be downloaded and returned to [place]. We will call you with the results. If monitor falls off or has orange flashing light, please call Zio for further instructions.      Follow-Up: At [facility], you and your health needs are our priority.  As part of our continuing mission to provide you with exceptional heart care, we have created designated Provider Care Teams.  These Care Teams include your primary Cardiologist (physician) and Advanced Practice Providers (APPs -  Physician Assistants and Nurse Practitioners) who all work together to provide you with the care you need, when you need it.  We recommend signing up for the patient portal called "MyChart".  Sign up information is provided on this After Visit Summary.  MyChart is used to connect with patients for Virtual Visits (Telemedicine).  Patients are able to view lab/test results, encounter notes, upcoming appointments, etc.  Non-urgent messages can be sent to your provider as  well.   To learn more about what you can do with MyChart, go to [email or web address].    Your next appointment:   3 month(s)  The format for your next appointment:   In Person  Provider:   Shirlee More, MD   Other Instructions

## 2020-07-13 ENCOUNTER — Telehealth: Payer: Self-pay

## 2020-07-13 ENCOUNTER — Encounter: Payer: Self-pay | Admitting: Cardiology

## 2020-07-13 DIAGNOSIS — M7989 Other specified soft tissue disorders: Secondary | ICD-10-CM | POA: Diagnosis not present

## 2020-07-13 DIAGNOSIS — M85872 Other specified disorders of bone density and structure, left ankle and foot: Secondary | ICD-10-CM | POA: Diagnosis not present

## 2020-07-13 DIAGNOSIS — S9002XA Contusion of left ankle, initial encounter: Secondary | ICD-10-CM | POA: Diagnosis not present

## 2020-07-13 LAB — BASIC METABOLIC PANEL
BUN/Creatinine Ratio: 22 (ref 12–28)
BUN: 16 mg/dL (ref 8–27)
CO2: 25 mmol/L (ref 20–29)
Calcium: 9.5 mg/dL (ref 8.7–10.3)
Chloride: 97 mmol/L (ref 96–106)
Creatinine, Ser: 0.73 mg/dL (ref 0.57–1.00)
GFR calc Af Amer: 88 mL/min/{1.73_m2} (ref >59)
GFR calc non Af Amer: 76 mL/min/{1.73_m2} (ref >59)
Glucose: 215 mg/dL — ABNORMAL HIGH (ref 65–99)
Potassium: 4.3 mmol/L (ref 3.5–5.2)
Sodium: 136 mmol/L (ref 134–144)

## 2020-07-13 LAB — CBC
Hematocrit: 40 % (ref 34.0–46.6)
Hemoglobin: 13.2 g/dL (ref 11.1–15.9)
MCH: 28.5 pg (ref 26.6–33.0)
MCHC: 33 g/dL (ref 31.5–35.7)
MCV: 86 fL (ref 79–97)
Platelets: 260 10*3/uL (ref 150–450)
RBC: 4.63 x10E6/uL (ref 3.77–5.28)
RDW: 13.1 % (ref 11.7–15.4)
WBC: 11.9 10*3/uL — ABNORMAL HIGH (ref 3.4–10.8)

## 2020-07-13 NOTE — Telephone Encounter (Signed)
-----   Message from Richardo Priest, MD sent at 07/13/2020  8:49 AM EDT ----- Labs are good except her white count is mildly elevated  Does she have a fever?  Urinary symptoms?  If not next time she sees her PCP asked her to request a repeat CBC to be done

## 2020-07-13 NOTE — Telephone Encounter (Signed)
Left message on patients voicemail to please return our call.   

## 2020-07-17 ENCOUNTER — Telehealth: Payer: Self-pay

## 2020-07-17 DIAGNOSIS — M546 Pain in thoracic spine: Secondary | ICD-10-CM | POA: Diagnosis not present

## 2020-07-17 DIAGNOSIS — M109 Gout, unspecified: Secondary | ICD-10-CM | POA: Diagnosis not present

## 2020-07-17 DIAGNOSIS — M79672 Pain in left foot: Secondary | ICD-10-CM | POA: Diagnosis not present

## 2020-07-17 NOTE — Telephone Encounter (Signed)
-----   Message from Richardo Priest, MD sent at 07/13/2020  8:49 AM EDT ----- Labs are good except her white count is mildly elevated  Does she have a fever?  Urinary symptoms?  If not next time she sees her PCP asked her to request a repeat CBC to be done

## 2020-07-17 NOTE — Telephone Encounter (Signed)
Left message on patients voicemail to please return our call.   

## 2020-07-17 NOTE — Telephone Encounter (Signed)
Spoke with patient regarding results and recommendation.  Patient verbalizes understanding and is agreeable to plan of care. Advised patient to call back with any issues or concerns.  

## 2020-07-17 NOTE — Telephone Encounter (Signed)
Pt called back in returning Morgans call.    Best number [phone]

## 2020-07-25 DIAGNOSIS — I4811 Longstanding persistent atrial fibrillation: Secondary | ICD-10-CM | POA: Diagnosis not present

## 2020-07-26 ENCOUNTER — Other Ambulatory Visit: Payer: Self-pay | Admitting: Cardiology

## 2020-07-26 DIAGNOSIS — I4819 Other persistent atrial fibrillation: Secondary | ICD-10-CM

## 2020-07-30 ENCOUNTER — Telehealth: Payer: Self-pay

## 2020-07-30 NOTE — Telephone Encounter (Signed)
-----   Message from Richardo Priest, MD sent at 07/30/2020  7:52 AM EST ----- Good result heart rate is well controlled no change in medication

## 2020-07-30 NOTE — Telephone Encounter (Signed)
Left message on patients voicemail to please return our call.   

## 2020-07-31 ENCOUNTER — Telehealth: Payer: Self-pay

## 2020-07-31 NOTE — Telephone Encounter (Signed)
Left message on patients voicemail to please return our call.   

## 2020-07-31 NOTE — Telephone Encounter (Signed)
-----   Message from Richardo Priest, MD sent at 07/30/2020  7:52 AM EST ----- Good result heart rate is well controlled no change in medication

## 2020-08-20 DIAGNOSIS — G4733 Obstructive sleep apnea (adult) (pediatric): Secondary | ICD-10-CM | POA: Diagnosis not present

## 2020-08-20 DIAGNOSIS — J4530 Mild persistent asthma, uncomplicated: Secondary | ICD-10-CM | POA: Diagnosis not present

## 2020-08-20 DIAGNOSIS — G4761 Periodic limb movement disorder: Secondary | ICD-10-CM | POA: Diagnosis not present

## 2020-08-20 DIAGNOSIS — R5383 Other fatigue: Secondary | ICD-10-CM | POA: Diagnosis not present

## 2020-08-20 DIAGNOSIS — Z7189 Other specified counseling: Secondary | ICD-10-CM | POA: Diagnosis not present

## 2020-08-20 DIAGNOSIS — I272 Pulmonary hypertension, unspecified: Secondary | ICD-10-CM | POA: Diagnosis not present

## 2020-09-17 DIAGNOSIS — E538 Deficiency of other specified B group vitamins: Secondary | ICD-10-CM | POA: Diagnosis not present

## 2020-09-17 DIAGNOSIS — R32 Unspecified urinary incontinence: Secondary | ICD-10-CM | POA: Diagnosis not present

## 2020-09-17 DIAGNOSIS — E785 Hyperlipidemia, unspecified: Secondary | ICD-10-CM | POA: Diagnosis not present

## 2020-09-17 DIAGNOSIS — J449 Chronic obstructive pulmonary disease, unspecified: Secondary | ICD-10-CM | POA: Diagnosis not present

## 2020-09-17 DIAGNOSIS — M1990 Unspecified osteoarthritis, unspecified site: Secondary | ICD-10-CM | POA: Diagnosis not present

## 2020-09-17 DIAGNOSIS — I10 Essential (primary) hypertension: Secondary | ICD-10-CM | POA: Diagnosis not present

## 2020-09-17 DIAGNOSIS — E118 Type 2 diabetes mellitus with unspecified complications: Secondary | ICD-10-CM | POA: Diagnosis not present

## 2020-09-17 DIAGNOSIS — I50810 Right heart failure, unspecified: Secondary | ICD-10-CM | POA: Diagnosis not present

## 2020-09-17 DIAGNOSIS — D649 Anemia, unspecified: Secondary | ICD-10-CM | POA: Diagnosis not present

## 2020-09-17 DIAGNOSIS — Z6827 Body mass index (BMI) 27.0-27.9, adult: Secondary | ICD-10-CM | POA: Diagnosis not present

## 2020-10-11 NOTE — Progress Notes (Deleted)
Cardiology Office Note:    Date:  10/11/2020   ID:  Lori Rodriguez, DOB 02-08-1935, MRN [id number]  PCP:  Ocie Doyne., MD  Cardiologist:  Shirlee More, MD    Referring MD: Ocie Doyne., MD    ASSESSMENT:    No diagnosis found. PLAN:    In order of problems listed above:  1. ***   Next appointment: ***   Medication Adjustments/Labs and Tests Ordered: Current medicines are reviewed at length with the patient today.  Concerns regarding medicines are outlined above.  No orders of the defined types were placed in this encounter.  No orders of the defined types were placed in this encounter.   No chief complaint on file.   History of Present Illness:    Lori Rodriguez is a 86 y.o. female with a hx of persistent atrial fibrillation with anticoagulation hypertensive heart disease with chronic diastolic heart failure and secondary pulmonary artery hypertension and orthostatic hypotension.  She was seen at [facility] September 2021 with decompensated heart failure echocardiogram showed normal ejection fraction and subsequently evaluated by pulmonary with a new diagnosis of obstructive sleep apnea.  She was last seen by me 07/12/2020 with a slow ventricular response her beta blocker dose was decreased by 50% and a ZIO monitor was applied to assess heart rate response.  Monitor showed good heart rate control predominantly in the range of 50 to 110 bpm and no pauses of 3 seconds or greater. Compliance with diet, lifestyle and medications: *** Past Medical History:  Diagnosis Date  . Anemia   . Anginal pain (Lori Rodriguez)   . Atrial fibrillation (Lori Rodriguez)   . Cancer [facility])    Left Breast Cancer  . Cancer of central portion of left female breast (Lori Rodriguez) 03/10/2016  . CHF (congestive heart failure) ([place])   . Chronic back pain   . Chronic kidney disease    Right Renal Mass  . Diabetes mellitus without complication ([place])   . DJD (degenerative joint disease)   .  Dysrhythmia    Atrial fibrillation  . Encounter for preprocedural cardiovascular examination 02/28/2016   Formatting of this note might be different from the original. Overview:  Remote normal coronary arteriography and echo 2015 with normal EF% Formatting of this note might be different from the original. Remote normal coronary arteriography and echo 2015 with normal EF%  . Estrogen receptor positive neoplasm 03/10/2016  . Hypertension   . Hypertensive heart disease 02/28/2016  . Long term (current) use of anticoagulants 02/29/2016   Overview:  Overview:  apixaban Overview:  apixaban  Formatting of this note might be different from the original. Overview:  apixaban Formatting of this note might be different from the original. apixaban  . Mixed hyperlipidemia 02/29/2016  . Orthostatic hypotension 12/01/2018  . Osteoporosis   . Persistent atrial fibrillation ([place]) 02/28/2016   Overview:  Overview:  CHADS2 vasc score= 4  Overview:  CHADS2 vasc score= 4 Overview:  CHADS2 vasc score= 4  Formatting of this note might be different from the original. Overview:  CHADS2 vasc score= 4  Overview:  CHADS2 vasc score= 4 Formatting of this note might be different from the original. CHADS2 vasc score= 4  . Right renal mass   . Secondary pulmonary arterial hypertension ([place]) 11/03/2018  . Type 2 diabetes mellitus ([place]) 02/29/2016  . Vitamin B12 deficiency   . Vitamin D deficiency     Past Surgical History:  Procedure Laterality Date  . BREAST LUMPECTOMY Left   .  CARDIAC CATHETERIZATION    . CATARACT EXTRACTION, BILATERAL    . CHOLECYSTECTOMY    . IR RADIOLOGIST EVAL & MGMT  09/29/2018  . IR RADIOLOGIST EVAL & MGMT  05/12/2019  . IR RADIOLOGIST EVAL & MGMT  05/22/2020  . TOTAL HIP ARTHROPLASTY Bilateral     Current Medications: No outpatient medications have been marked as taking for the 10/12/20 encounter (Appointment) with Richardo Priest, MD.     Allergies:   Lisinopril, Percocet [oxycodone-acetaminophen], Valium  [diazepam], and Prednisone   Social History   Socioeconomic History  . Marital status: Widowed    Spouse name: Not on file  . Number of children: Not on file  . Years of education: Not on file  . Highest education level: Not on file  Occupational History  . Not on file  Tobacco Use  . Smoking status: Never Smoker  . Smokeless tobacco: Never Used  Vaping Use  . Vaping Use: Never used  Substance and Sexual Activity  . Alcohol use: Never  . Drug use: Never  . Sexual activity: Not on file  Other Topics Concern  . Not on file  Social History Narrative  . Not on file   Social Determinants of Health   Financial Resource Strain: Not on file  Food Insecurity: Not on file  Transportation Needs: Not on file  Physical Activity: Not on file  Stress: Not on file  Social Connections: Not on file     Family History: The patient's ***family history includes Diabetes in her brother; Heart attack in her brother and father; Myasthenia gravis in her brother. ROS:   Please see the history of present illness.    All other systems reviewed and are negative.  EKGs/Labs/Other Studies Reviewed:    The following studies were reviewed today:  EKG:  EKG ordered today and personally reviewed.  The ekg ordered today demonstrates ***  Recent Labs: 05/25/2020: NT-Pro BNP 587 07/12/2020: BUN 16; Creatinine, Ser 0.73; Hemoglobin 13.2; Platelets 260; Potassium 4.3; Sodium 136  Recent Lipid Panel No results found for: CHOL, TRIG, HDL, CHOLHDL, VLDL, LDLCALC, LDLDIRECT  Physical Exam:    VS:  There were no vitals taken for this visit.    Wt Readings from Last 3 Encounters:  07/12/20 179 lb 12.8 oz (81.6 kg)  05/25/20 180 lb 3.2 oz (81.7 kg)  01/24/20 185 lb (83.9 kg)     GEN: *** Well nourished, well developed in no acute distress HEENT: Normal NECK: No JVD; No carotid bruits LYMPHATICS: No lymphadenopathy CARDIAC: ***RRR, no murmurs, rubs, gallops RESPIRATORY:  Clear to auscultation  without rales, wheezing or rhonchi  ABDOMEN: Soft, non-tender, non-distended MUSCULOSKELETAL:  No edema; No deformity  SKIN: Warm and dry NEUROLOGIC:  Alert and oriented x 3 PSYCHIATRIC:  Normal affect    Signed, Shirlee More, MD  10/11/2020 7:44 AM     [facility]

## 2020-10-12 ENCOUNTER — Ambulatory Visit: Payer: Medicare PPO | Admitting: Cardiology

## 2020-11-21 NOTE — Progress Notes (Signed)
[facility]  [address] [place],  [place]  [id number] [phone]  Clinic Day:  11/22/2020  Referring physician: Ocie Doyne., MD  This document serves as a record of services personally performed by Hosie Poisson, MD. It was created on their behalf by [facility], a trained medical scribe. The creation of this record is based on the scribe's personal observations and the provider's statements to them.  CHIEF COMPLAINT:  CC: History of stage IA hormone receptor positive left breast cancer  Current Treatment:  Surveillance  HISTORY OF PRESENT ILLNESS:  Lori Rodriguez is a 86 y.o. female with a history of stage IA (T1c N0 M0) hormone receptor positive left breast cancer diagnosed in February 2015.  She was treated with lumpectomy.  Pathology revealed a 1.7 cm, grade 1, invasive ductal carcinoma in the background of low-grade ductal carcinoma in situ.  Four sentinel nodes were negative for metastasis.  Estrogen and progesterone receptors were positive and her 2 Neu negative.  Ki-67 was 13%.  She had multiple favorable prognostic characteristics, so adjuvant chemotherapy was not recommended.  She declined adjuvant radiation therapy.  She was placed on anastrozole 1 mg daily in May 2015.  She was hospitalized in October 2017 with pneumonia.  CTA chest revealed interstitial edema versus infection, as well as areas of atelectasis and a right thyroid nodule, which was small and benign.  Bone density scan in June 2017 was normal.   She has remained without evidence of recurrent disease.  She had a right hip replacement in July 2017.  She underwent a left hip replacement in May 2018.   Bone density scan in March 2020 remained normal.  Since her bone density scan was normal even after nearly 5 years of anastrozole, we probably do not need to repeat this again.  She completed 5 years of anastrozole in May 2020.    Abdominal ultrasound in October 2019 was  done due to abdominal pain and revealed a solid-appearing lesion of the right kidney.  She therefore underwent CT abdomen in November 2019 which revealed a 1.9 cm solid lesion of the right kidney, as well as a 1.2 cm solid lesion of the right kidney that had increased in size.  There was a 4.8 cm cyst of the left kidney which was a Bosniak 1.  She had seen Dr. Nila Nephew who referred her to Dr. Aletta Edouard to discuss possible cryoablation.  She was able to have the MRI abdomen in August 2020 which revealed 2 solid enhancing masses of the right kidney.  The lesion in the right mid kidney was essentially stable, measuring 2.1 x 2 cm.  The lesion in the right upper pole had minimally increased in size measuring, 1.6 x 1.3 cm, previously 1.5 x 1.2 cm.  She states that Dr. Kathlene Cote did not recommend ablation at that time.  She has heart failure and atrial fibrillation and is followed by [facility].  She states she is back on diuretic and continues metoprolol and rivaroxaban.  She had a CT scan in June 2021, which did not even show definite renal masses, but they describe perinephric stranding.  She did have a follow up thyroid ultrasound from March 2021 revealed no significant interval change in the size, appearance or characteristics of the 1.8 cm. Recommend 1 additional follow-up ultrasound in October 2022 to confirm 5 year stability and thus benignity.  She has had bilateral hip replacements.  INTERVAL HISTORY:  Lori Rodriguez is here  for annual follow up and states that she has been doing well recently.  Annual mammogram from August 2021 was clear.  She will be due for annual mammogram and to see Dr. Noberto Retort this August.  She states that she did have one severe fall between Thanksgiving and Christmas, resulting in severe facial bruising, left knee and left ankle injury.  She now uses a cane to ambulate at all times.  Her  appetite is good, and she has lost 11 pounds since her last visit.  She denies fever, chills or  other signs of infection.  She denies nausea, vomiting, bowel issues, or abdominal pain.  She denies sore throat, cough, dyspnea, or chest pain.  REVIEW OF SYSTEMS:  Review of Systems  Constitutional: Negative.  Negative for appetite change, chills, fatigue, fever and unexpected weight change.  HENT:  Negative.   Eyes: Negative.   Respiratory: Negative.  Negative for chest tightness, cough, hemoptysis, shortness of breath and wheezing.   Cardiovascular: Negative.  Negative for chest pain, leg swelling and palpitations.  Gastrointestinal: Negative.  Negative for abdominal distention, abdominal pain, blood in stool, constipation, diarrhea, nausea and vomiting.  Endocrine: Negative.   Genitourinary: Negative.  Negative for difficulty urinating, dysuria, frequency and hematuria.   Musculoskeletal: Positive for gait problem (uses a cane to ambulate). Negative for arthralgias, back pain, flank pain and myalgias.  Skin: Negative.   Neurological: Positive for gait problem (uses a cane to ambulate). Negative for dizziness, extremity weakness, headaches, light-headedness, numbness, seizures and speech difficulty.  Hematological: Negative.   Psychiatric/Behavioral: Negative.  Negative for depression and sleep disturbance. The patient is not nervous/anxious.      VITALS:  Blood pressure 123/79, pulse 80, temperature 98.6 F (37 C), temperature source Oral, resp. rate 18, height '5\' 7"'  (1.702 m), weight 179 lb 12.8 oz (81.6 kg), SpO2 97 %.  Wt Readings from Last 3 Encounters:  11/22/20 179 lb 12.8 oz (81.6 kg)  07/12/20 179 lb 12.8 oz (81.6 kg)  05/25/20 180 lb 3.2 oz (81.7 kg)    Body mass index is 28.16 kg/m.  Performance status (ECOG): 0 - Asymptomatic  PHYSICAL EXAM:  Physical Exam Constitutional:      General: She is not in acute distress.    Appearance: Normal appearance. She is normal weight.  HENT:     Head: Normocephalic and atraumatic.  Eyes:     General: No scleral icterus.     Extraocular Movements: Extraocular movements intact.     Conjunctiva/sclera: Conjunctivae normal.     Pupils: Pupils are equal, round, and reactive to light.  Cardiovascular:     Rate and Rhythm: Normal rate and regular rhythm.     Pulses: Normal pulses.     Heart sounds: Normal heart sounds. No murmur heard. No friction rub. No gallop.   Pulmonary:     Effort: Pulmonary effort is normal. No respiratory distress.     Breath sounds: Normal breath sounds.  Chest:     Comments: Well healed scar in the upper inner quadrant of the left breast adjacent to the areolar complex. She has fibrocystic changes in the upper right breast. Abdominal:     General: Bowel sounds are normal. There is no distension.     Palpations: Abdomen is soft. There is no hepatomegaly, splenomegaly or mass.     Tenderness: There is no abdominal tenderness.  Musculoskeletal:        General: Normal range of motion.     Cervical back: Normal range of  motion and neck supple.     Right lower leg: No edema.     Left lower leg: No edema.  Lymphadenopathy:     Cervical: No cervical adenopathy.  Skin:    General: Skin is warm and dry.  Neurological:     General: No focal deficit present.     Mental Status: She is alert and oriented to person, place, and time. Mental status is at baseline.  Psychiatric:        Mood and Affect: Mood normal.        Behavior: Behavior normal.        Thought Content: Thought content normal.        Judgment: Judgment normal.     LABS:   CBC Latest Ref Rng & Units 07/12/2020  WBC 3.4 - 10.8 x10E3/uL 11.9(H)  Hemoglobin 11.1 - 15.9 g/dL 13.2  Hematocrit 34.0 - 46.6 % 40.0  Platelets 150 - 450 x10E3/uL 260   CMP Latest Ref Rng & Units 07/12/2020 05/25/2020 05/10/2019  Glucose 65 - 99 mg/dL 215(H) 231(H) -  BUN 8 - 27 mg/dL 16 18 -  Creatinine 0.57 - 1.00 mg/dL 0.73 0.90 0.70  Sodium 134 - 144 mmol/L 136 141 -  Potassium 3.5 - 5.2 mmol/L 4.3 4.7 -  Chloride 96 - 106 mmol/L 97 104 -   CO2 20 - 29 mmol/L 25 25 -  Calcium 8.7 - 10.3 mg/dL 9.5 9.4 -    STUDIES:   EXAM: 12/06/2019 THYROID ULTRASOUND  TECHNIQUE: Ultrasound examination of the thyroid gland and adjacent soft tissues was performed.  COMPARISON:  Prior thyroid ultrasound 07/04/2016  FINDINGS: Parenchymal Echotexture: Markedly heterogenous  Isthmus: 0.5 cm  Right lobe: 5.5 x 2.9 x 2.9 cm  Left lobe: 4.1 x 1.6 x 1.2 cm  _________________________________________________________  Estimated total number of nodules >/= 1 cm: 1  Number of spongiform nodules >/=  2 cm not described below (TR1): 0  Number of mixed cystic and solid nodules >/= 1.5 cm not described below ([place]): 0  _________________________________________________________  Nodule # 1:  Prior biopsy: No  Location: Right; Superior  Maximum size: 1.8 cm; Other 2 dimensions: 1.0 x 1.5 cm, previously, 1.9 x 1.0 x 1.5 cm  Composition: solid/almost completely solid (2)  Echogenicity: isoechoic (1)  Shape: not taller-than-wide (0)  Margins: smooth (0)  Echogenic foci: none (0)  ACR TI-RADS total points: 3.  ACR TI-RADS risk category:  TR3 (3 points).  Significant change in size (>/= 20% in two dimensions and minimal increase of 2 mm): No  Change in features: No  Change in ACR TI-RADS risk category: *Given size (>/= 1.5 - 2.4 cm) and appearance, a follow-up ultrasound in 1 year should be considered based on TI-RADS criteria.  ACR TI-RADS recommendations:  _________________________________________________________  Extremely heterogeneous, lobular and enlarged right mid and inferior gland consistent with diffuse goitrous change. There are several tiny subcentimeter cysts and benign-appearing colloid nodules scattered throughout the left gland. These do not meet criteria for further evaluation.  IMPRESSION: No significant interval change in the size, appearance or characteristics of the 1.8 cm TI-RADS category 3  nodule in the right superior gland dating back to July 04, 2016. Recommend 1 additional follow-up ultrasound in October 2022 to confirm 5 year stability and thus benignity.  The above is in keeping with the ACR TI-RADS recommendations - J Am Coll Radiol 2017;14:587-595.   EXAM: 02/21/2020 CT CHEST, ABDOMEN, AND PELVIS WITH CONTRAST  TECHNIQUE: Multidetector CT imaging of the chest,  abdomen and pelvis was performed following the standard protocol during bolus administration of intravenous contrast.  CONTRAST:  100 cc Isovue 370.  COMPARISON:  Chest radiograph dated 02/21/2020 and CT abdomen dated 08/05/2018.  FINDINGS: CT CHEST FINDINGS  Cardiovascular: There is no cardiomegaly. Three vessel coronary vascular calcification. The small pericardial effusion measuring approximately 9 mm in thickness anterior to the heart. There is moderate atherosclerotic calcification of the thoracic aorta. No aneurysmal dilatation or dissection. Mild dilatation of the main pulmonary trunk may represent a degree of pulmonary hypertension. Clinical correlation is recommended. Evaluation of the pulmonary arteries is very limited due to respiratory motion artifact.  Mediastinum/Nodes: There is no hilar or mediastinal adenopathy. The esophagus is grossly unremarkable. Asymmetric enlargement of the posterior right thyroid lobe with heterogeneous enhancement or nodules. Ultrasound may provide better evaluation. No mediastinal fluid collection.  Lungs/Pleura: There are bibasilar linear atelectasis/scarring. There is subsegmental and platelike atelectasis of the right upper lobe along the fissure. There is diffuse mosaic attenuation of the lungs, which may be related to areas of air trapping or represent small vessel versus small airway disease. No lobar consolidation. There is no pleural effusion or pneumothorax. The central airways are patent.  Musculoskeletal: Degenerative changes of the  spine. Postsurgical changes of the left breast and scarring. No acute osseous pathology.  CT ABDOMEN PELVIS FINDINGS  No intra-abdominal free air or free fluid.  Hepatobiliary: Probable mild fatty infiltration of the liver. No intrahepatic biliary dilatation. Cholecystectomy. No retained calcified stone noted in the central CBD.  Pancreas: Unremarkable. No pancreatic ductal dilatation or surrounding inflammatory changes.  Spleen: Normal in size without focal abnormality.  Adrenals/Urinary Tract: Indeterminate 2 cm right adrenal nodule, likely adenoma. The left adrenal gland is unremarkable. There is no hydronephrosis on either side. There is symmetric enhancement and excretion of contrast by both kidneys. Subcentimeter right renal upper pole hypodense focus is not characterized but appears similar to prior CT. The previously seen enhancing lesion in the inferior pole of the right kidney is not identified on this CT and may be surgically resected or ablated. Faint small hypodense focus corresponding to this area may represent post surgical changes and scarring. A 3.5 cm left renal interpolar cyst. Bilateral perinephric stranding, nonspecific. Correlation with urinalysis recommended to exclude UTI. The visualized ureters and urinary bladder appear unremarkable.  Stomach/Bowel: There is no bowel obstruction or active inflammation. The appendix is normal.  Vascular/Lymphatic: Moderate aortoiliac atherosclerotic disease. The IVC is unremarkable. No portal venous gas. There is no adenopathy.  Reproductive: The uterus is anteverted. A 12 mm focus of calcification involving the right ovary. Indeterminate an ill-defined soft tissue density along the broad ligament superior to the uterus may represent volume averaging of the ovaries. A small loculated fluid collection is less likely. Ultrasound may provide better evaluation of the pelvic structures if clinically indicated.  Other:  None  Musculoskeletal: Osteopenia with degenerative changes of the spine. Bilateral hip arthroplasties. No acute osseous pathology.  IMPRESSION: 1. No acute intrathoracic, abdominal, or pelvic pathology. No bowel obstruction. Normal appendix. 2. Nonspecific bilateral perinephric stranding. Correlation with urinalysis recommended to exclude UTI. 3. Aortic Atherosclerosis (ICD10-I70.0).   She underwent digital screening bilateral mammogram with tomography on 05/08/2020 showing: breast density category B.  No mammographic evidence of malignancy.  Allergies:  Allergies  Allergen Reactions  . Lisinopril Cough  . Percocet [Oxycodone-Acetaminophen] Other (See Comments)    Hallucinations  . Valium [Diazepam] Other (See Comments)    Confusion  . Prednisone Anxiety and  Other (See Comments)     Dizziness, weakness     Current Medications: Current Outpatient Medications  Medication Sig Dispense Refill  . albuterol (PROVENTIL HFA;VENTOLIN HFA) 108 (90 Base) MCG/ACT inhaler Inhale 2 puffs into the lungs 4 (four) times daily as needed.    . Calcium Carbonate-Vitamin D (CALCIUM-VITAMIN D3) 600-125 MG-UNIT TABS Take 1 capsule by mouth daily.    . Cyanocobalamin (VITAMIN B-12) 5000 MCG TBDP Take 5,000 mcg by mouth daily. Take 2 tablets daily    . ferrous sulfate 325 (65 FE) MG tablet Take 325 mg by mouth every other day.     . fluticasone furoate-vilanterol (BREO ELLIPTA) 100-25 MCG/INH AEPB Place 1 puff into the nose daily.    . furosemide (LASIX) 20 MG tablet Take 20 mg by mouth daily. Can take second tablet in the evening if weight increases more than 3 lbs    . glipiZIDE (GLUCOTROL XL) 5 MG 24 hr tablet Take 5 mg by mouth 2 (two) times daily.     Marland Kitchen labetalol (NORMODYNE) 200 MG tablet Take 0.5 tablets by mouth 2 (two) times daily.    . metFORMIN (GLUCOPHAGE) 1000 MG tablet Take 1,000 mg by mouth 2 (two) times daily with a meal.    . rivaroxaban (XARELTO) 20 MG TABS tablet Take 20 mg by  mouth daily with supper.    . rosuvastatin (CRESTOR) 40 MG tablet Take 40 mg by mouth daily.    . sertraline (ZOLOFT) 100 MG tablet Take 100 mg by mouth daily.     Marland Kitchen tolterodine (DETROL LA) 4 MG 24 hr capsule Take 1 capsule by mouth every morning.    . traMADol (ULTRAM) 50 MG tablet Take 50 mg by mouth daily as needed.    . Vitamin D, Ergocalciferol, (DRISDOL) 1.25 MG (50000 UT) CAPS capsule Take 50,000 Units by mouth once a week.     No current facility-administered medications for this visit.     ASSESSMENT & PLAN:   Assessment:   1. Stage IA hormone receptor breast cancer.  She was treated with surgery and hormonal therapy.  She completed 5 years in May 2020. She remains without evidence of recurrence.  She will be due for mammogram and follow-up with Dr. Noberto Retort in August.  2. Solid right renal lesions, which were suspicious for renal cell carcinoma.  A follow up CT in June 2021 did not appear concerning.  3. Thyroid nodules.  Repeat ultrasound from March 2021 remains stable.  One more repeat examination was recommended for 5 year stability.  Plan: We will plan to see her back in 1 year for reexamination.  The patient understands the plans discussed today and is in agreement with them.  She knows to contact our office if she develops concerns regarding her breast cancer.   I provided 15 minutes of face-to-face time during this this encounter and > 50% was spent counseling as documented under my assessment and plan.    Derwood Kaplan, MD [facility] [address] [place] [address] Dept: [phone] Dept Fax: [phone]   I, Rita Ohara, am acting as scribe for Derwood Kaplan, MD  I have reviewed this report as typed by the medical scribe, and it is complete and accurate.  Hermina Barters

## 2020-11-22 ENCOUNTER — Inpatient Hospital Stay: Payer: Medicare PPO | Attending: Oncology | Admitting: Oncology

## 2020-11-22 ENCOUNTER — Encounter: Payer: Self-pay | Admitting: Oncology

## 2020-11-22 ENCOUNTER — Other Ambulatory Visit: Payer: Self-pay

## 2020-11-22 VITALS — BP 123/79 | HR 80 | Temp 98.6°F | Resp 18 | Ht 67.0 in | Wt 179.8 lb

## 2020-11-22 DIAGNOSIS — C50112 Malignant neoplasm of central portion of left female breast: Secondary | ICD-10-CM | POA: Diagnosis not present

## 2020-11-22 DIAGNOSIS — Z170 Estrogen receptor positive status [ER+]: Secondary | ICD-10-CM

## 2020-12-06 ENCOUNTER — Encounter: Payer: Self-pay | Admitting: Cardiology

## 2020-12-06 ENCOUNTER — Ambulatory Visit (INDEPENDENT_AMBULATORY_CARE_PROVIDER_SITE_OTHER): Payer: Medicare PPO | Admitting: Cardiology

## 2020-12-06 ENCOUNTER — Other Ambulatory Visit: Payer: Self-pay

## 2020-12-06 VITALS — BP 120/68 | HR 84 | Ht 67.0 in | Wt 179.8 lb

## 2020-12-06 DIAGNOSIS — I110 Hypertensive heart disease with heart failure: Secondary | ICD-10-CM

## 2020-12-06 DIAGNOSIS — I951 Orthostatic hypotension: Secondary | ICD-10-CM

## 2020-12-06 DIAGNOSIS — I4819 Other persistent atrial fibrillation: Secondary | ICD-10-CM | POA: Diagnosis not present

## 2020-12-06 DIAGNOSIS — Z7901 Long term (current) use of anticoagulants: Secondary | ICD-10-CM

## 2020-12-06 DIAGNOSIS — I5032 Chronic diastolic (congestive) heart failure: Secondary | ICD-10-CM | POA: Diagnosis not present

## 2020-12-06 MED ORDER — LABETALOL HCL 200 MG PO TABS: 100.0000 mg | ORAL_TABLET | Freq: Every day | ORAL | 3 refills | Status: DC

## 2020-12-06 NOTE — Progress Notes (Signed)
Cardiology Office Note:    Date:  12/06/2020   ID:  Lori Rodriguez, DOB 11/10/1934, MRN [id number]  PCP:  Penelope Coop, FNP  Cardiologist:  Shirlee More, MD    Referring MD: Ocie Doyne., MD    ASSESSMENT:    1. Persistent atrial fibrillation ([place])   2. Long term (current) use of anticoagulants   3. Hypertensive heart disease with chronic diastolic congestive heart failure ([place])   4. Orthostatic hypotension    PLAN:    In order of problems listed above:  1. Stable heart rate is well controlled I think we can reduce her labetalol which has a marked blood pressure alpha-adrenergic effect especially with orthostatic symptoms and previous symptomatic orthostatic hypotension.  She will continue to trend heart rate and blood pressure at home. 2. Continue her current anticoagulant moderate stroke risk 3. Heart failure is nicely compensated she has no fluid overload we will continue her low-dose loop diuretic furosemide weight-based 4. Ongoing symptoms although not as severe reduce labetalol with a strong alpha blocker effect   Next appointment: 6 months   Medication Adjustments/Labs and Tests Ordered: Current medicines are reviewed at length with the patient today.  Concerns regarding medicines are outlined above.  No orders of the defined types were placed in this encounter.  Meds ordered this encounter  Medications  . labetalol (NORMODYNE) 200 MG tablet    Sig: Take 0.5 tablets (100 mg total) by mouth daily.    Dispense:  45 tablet    Refill:  3    No chief complaint on file.   History of Present Illness:    Lori Rodriguez is a 86 y.o. female with a hx of persistent atrial fibrillation with anticoagulation hypertensive heart disease or chronic diastolic heart failure and orthostatic hypotension.  She was last seen 07/12/2020. Compliance with diet, lifestyle and medications: Yes  Overall she is doing well her blood pressure usually runs in the  range of 110 130/70.  It is not as severe but she still has episodes of lightheadedness when she shifts posture especially in the morning and when I stood her in the office her blood pressure is 110/16 she was lightheaded.  I am going to reduce her labetalol to once daily and continue to trend heart rate and blood pressure at home her heart rates consistently run in the range of 80 to 90 bpm.  She is not having edema shortness of breath chest pain or syncope.  Most recent labs done primary care see graft show cholesterol at target 162 LDL 86 triglycerides 149 HDL 50 A1c remains elevated 9.5% creatinine normal 0.83. Past Medical History:  Diagnosis Date  . Anemia   . Anginal pain ([place])   . Atrial fibrillation ([place])   . Cancer [facility])    Left Breast Cancer  . Cancer of central portion of left female breast ([place]) 03/10/2016  . CHF (congestive heart failure) ([place])   . Chronic back pain   . Chronic kidney disease    Right Renal Mass  . Diabetes mellitus without complication ([place])   . DJD (degenerative joint disease)   . Dysrhythmia    Atrial fibrillation  . Encounter for preprocedural cardiovascular examination 02/28/2016   Formatting of this note might be different from the original. Overview:  Remote normal coronary arteriography and echo 2015 with normal EF% Formatting of this note might be different from the original. Remote normal coronary arteriography and echo 2015 with normal EF%  .  Estrogen receptor positive neoplasm 03/10/2016  . Hypertension   . Hypertensive heart disease 02/28/2016  . Long term (current) use of anticoagulants 02/29/2016   Overview:  Overview:  apixaban Overview:  apixaban  Formatting of this note might be different from the original. Overview:  apixaban Formatting of this note might be different from the original. apixaban  . Mixed hyperlipidemia 02/29/2016  . Orthostatic hypotension 12/01/2018  . Osteoporosis   . Persistent atrial fibrillation ([place]) 02/28/2016   Overview:   Overview:  CHADS2 vasc score= 4  Overview:  CHADS2 vasc score= 4 Overview:  CHADS2 vasc score= 4  Formatting of this note might be different from the original. Overview:  CHADS2 vasc score= 4  Overview:  CHADS2 vasc score= 4 Formatting of this note might be different from the original. CHADS2 vasc score= 4  . Right renal mass   . Secondary pulmonary arterial hypertension ([place]) 11/03/2018  . Type 2 diabetes mellitus ([place]) 02/29/2016  . Vitamin B12 deficiency   . Vitamin D deficiency     Past Surgical History:  Procedure Laterality Date  . BREAST LUMPECTOMY Left   . CARDIAC CATHETERIZATION    . CATARACT EXTRACTION, BILATERAL    . CHOLECYSTECTOMY    . IR RADIOLOGIST EVAL & MGMT  09/29/2018  . IR RADIOLOGIST EVAL & MGMT  05/12/2019  . IR RADIOLOGIST EVAL & MGMT  05/22/2020  . TOTAL HIP ARTHROPLASTY Bilateral     Current Medications: Current Meds  Medication Sig  . albuterol (PROVENTIL HFA;VENTOLIN HFA) 108 (90 Base) MCG/ACT inhaler Inhale 2 puffs into the lungs 4 (four) times daily as needed.  . Calcium Carbonate-Vitamin D (CALCIUM-VITAMIN D3) 600-125 MG-UNIT TABS Take 1 capsule by mouth daily.  . Cyanocobalamin (VITAMIN B-12) 5000 MCG TBDP Take 5,000 mcg by mouth daily. Take 2 tablets daily  . ferrous sulfate 325 (65 FE) MG tablet Take 325 mg by mouth every other day.   . fluticasone furoate-vilanterol (BREO ELLIPTA) 100-25 MCG/INH AEPB Place 1 puff into the nose daily.  . furosemide (LASIX) 20 MG tablet Take 20 mg by mouth daily. Can take second tablet in the evening if weight increases more than 3 lbs  . glipiZIDE (GLUCOTROL XL) 5 MG 24 hr tablet Take 5 mg by mouth 2 (two) times daily.   Marland Kitchen labetalol (NORMODYNE) 200 MG tablet Take 0.5 tablets (100 mg total) by mouth daily.  . metFORMIN (GLUCOPHAGE) 1000 MG tablet Take 1,000 mg by mouth 2 (two) times daily with a meal.  . rivaroxaban (XARELTO) 20 MG TABS tablet Take 20 mg by mouth daily with supper.  . rosuvastatin (CRESTOR) 40 MG tablet Take  40 mg by mouth daily.  . sertraline (ZOLOFT) 100 MG tablet Take 100 mg by mouth daily.   Marland Kitchen tolterodine (DETROL LA) 4 MG 24 hr capsule Take 1 capsule by mouth every morning.  . traMADol (ULTRAM) 50 MG tablet Take 50 mg by mouth daily as needed.  . Vitamin D, Ergocalciferol, (DRISDOL) 1.25 MG (50000 UT) CAPS capsule Take 50,000 Units by mouth once a week.  . [DISCONTINUED] labetalol (NORMODYNE) 200 MG tablet Take 0.5 tablets by mouth 2 (two) times daily.     Allergies:   Lisinopril, Percocet [oxycodone-acetaminophen], Valium [diazepam], and Prednisone   Social History   Socioeconomic History  . Marital status: Widowed    Spouse name: Not on file  . Number of children: Not on file  . Years of education: Not on file  . Highest education level: Not on file  Occupational History  . Not on file  Tobacco Use  . Smoking status: Never Smoker  . Smokeless tobacco: Never Used  Vaping Use  . Vaping Use: Never used  Substance and Sexual Activity  . Alcohol use: Never  . Drug use: Never  . Sexual activity: Not on file  Other Topics Concern  . Not on file  Social History Narrative  . Not on file   Social Determinants of Health   Financial Resource Strain: Not on file  Food Insecurity: Not on file  Transportation Needs: Not on file  Physical Activity: Not on file  Stress: Not on file  Social Connections: Not on file     Family History: The patient's family history includes Diabetes in her brother; Heart attack in her brother and father; Myasthenia gravis in her brother. ROS:   Please see the history of present illness.    All other systems reviewed and are negative.  EKGs/Labs/Other Studies Reviewed:    The following studies were reviewed today:    Recent Labs: 05/25/2020: NT-Pro BNP 587 07/12/2020: BUN 16; Creatinine, Ser 0.73; Hemoglobin 13.2; Platelets 260; Potassium 4.3; Sodium 136  Recent Lipid Panel No results found for: CHOL, TRIG, HDL, CHOLHDL, VLDL, LDLCALC,  LDLDIRECT  Physical Exam:    VS:  BP 120/68   Pulse 84   Ht 5\' 7"  (1.702 m)   Wt 179 lb 12.8 oz (81.6 kg)   SpO2 96%   BMI 28.16 kg/m     Wt Readings from Last 3 Encounters:  12/06/20 179 lb 12.8 oz (81.6 kg)  11/22/20 179 lb 12.8 oz (81.6 kg)  07/12/20 179 lb 12.8 oz (81.6 kg)     GEN:  Well nourished, well developed in no acute distress HEENT: Normal NECK: No JVD; No carotid bruits LYMPHATICS: No lymphadenopathy CARDIAC: S1 is variable heart rhythm is irregular  no murmurs, rubs, gallops RESPIRATORY:  Clear to auscultation without rales, wheezing or rhonchi  ABDOMEN: Soft, non-tender, non-distended MUSCULOSKELETAL:  No edema; No deformity  SKIN: Warm and dry NEUROLOGIC:  Alert and oriented x 3 PSYCHIATRIC:  Normal affect    Signed, Shirlee More, MD  12/06/2020 3:48 PM    [facility]

## 2020-12-06 NOTE — Patient Instructions (Signed)
Medication Instructions:  Your physician has recommended you make the following change in your medication:  DECREASE: Labetolol 0.5 tablet by mouth daily.  *If you need a refill on your cardiac medications before your next appointment, please call your pharmacy*   Lab Work: None If you have labs (blood work) drawn today and your tests are completely normal, you will receive your results only by: Marland Kitchen MyChart Message (if you have MyChart) OR . A paper copy in the mail If you have any lab test that is abnormal or we need to change your treatment, we will call you to review the results.   Testing/Procedures: None   Follow-Up: At [facility], you and your health needs are our priority.  As part of our continuing mission to provide you with exceptional heart care, we have created designated Provider Care Teams.  These Care Teams include your primary Cardiologist (physician) and Advanced Practice Providers (APPs -  Physician Assistants and Nurse Practitioners) who all work together to provide you with the care you need, when you need it.  We recommend signing up for the patient portal called "MyChart".  Sign up information is provided on this After Visit Summary.  MyChart is used to connect with patients for Virtual Visits (Telemedicine).  Patients are able to view lab/test results, encounter notes, upcoming appointments, etc.  Non-urgent messages can be sent to your provider as well.   To learn more about what you can do with MyChart, go to [email or web address].    Your next appointment:   6 month(s)  The format for your next appointment:   In Person  Provider:   Shirlee More, MD   Other Instructions

## 2020-12-18 ENCOUNTER — Other Ambulatory Visit: Payer: Self-pay | Admitting: Cardiology

## 2020-12-20 DIAGNOSIS — M1990 Unspecified osteoarthritis, unspecified site: Secondary | ICD-10-CM | POA: Diagnosis not present

## 2020-12-20 DIAGNOSIS — Z139 Encounter for screening, unspecified: Secondary | ICD-10-CM | POA: Diagnosis not present

## 2020-12-20 DIAGNOSIS — I50810 Right heart failure, unspecified: Secondary | ICD-10-CM | POA: Diagnosis not present

## 2020-12-20 DIAGNOSIS — Z6827 Body mass index (BMI) 27.0-27.9, adult: Secondary | ICD-10-CM | POA: Diagnosis not present

## 2020-12-20 DIAGNOSIS — D649 Anemia, unspecified: Secondary | ICD-10-CM | POA: Diagnosis not present

## 2020-12-20 DIAGNOSIS — E118 Type 2 diabetes mellitus with unspecified complications: Secondary | ICD-10-CM | POA: Diagnosis not present

## 2020-12-20 DIAGNOSIS — E785 Hyperlipidemia, unspecified: Secondary | ICD-10-CM | POA: Diagnosis not present

## 2020-12-20 DIAGNOSIS — J449 Chronic obstructive pulmonary disease, unspecified: Secondary | ICD-10-CM | POA: Diagnosis not present

## 2020-12-20 DIAGNOSIS — E559 Vitamin D deficiency, unspecified: Secondary | ICD-10-CM | POA: Diagnosis not present

## 2020-12-20 DIAGNOSIS — I471 Supraventricular tachycardia: Secondary | ICD-10-CM | POA: Diagnosis not present

## 2020-12-20 DIAGNOSIS — Z79899 Other long term (current) drug therapy: Secondary | ICD-10-CM | POA: Diagnosis not present

## 2021-01-16 ENCOUNTER — Other Ambulatory Visit: Payer: Self-pay | Admitting: Cardiology

## 2021-01-16 NOTE — Telephone Encounter (Signed)
21f, 81.6kg, scr 0.73 07/12/20, lovw/munley 12/06/20, ccr 75,2

## 2021-01-19 DIAGNOSIS — Z8719 Personal history of other diseases of the digestive system: Secondary | ICD-10-CM | POA: Diagnosis not present

## 2021-01-19 DIAGNOSIS — R42 Dizziness and giddiness: Secondary | ICD-10-CM | POA: Diagnosis not present

## 2021-01-19 DIAGNOSIS — I6523 Occlusion and stenosis of bilateral carotid arteries: Secondary | ICD-10-CM | POA: Diagnosis not present

## 2021-01-19 DIAGNOSIS — S3991XA Unspecified injury of abdomen, initial encounter: Secondary | ICD-10-CM | POA: Diagnosis not present

## 2021-01-19 DIAGNOSIS — M47812 Spondylosis without myelopathy or radiculopathy, cervical region: Secondary | ICD-10-CM | POA: Diagnosis not present

## 2021-01-19 DIAGNOSIS — S7221XA Displaced subtrochanteric fracture of right femur, initial encounter for closed fracture: Secondary | ICD-10-CM | POA: Diagnosis not present

## 2021-01-19 DIAGNOSIS — I728 Aneurysm of other specified arteries: Secondary | ICD-10-CM | POA: Diagnosis not present

## 2021-01-19 DIAGNOSIS — M25551 Pain in right hip: Secondary | ICD-10-CM | POA: Diagnosis not present

## 2021-01-19 DIAGNOSIS — R110 Nausea: Secondary | ICD-10-CM | POA: Diagnosis not present

## 2021-01-19 DIAGNOSIS — M1711 Unilateral primary osteoarthritis, right knee: Secondary | ICD-10-CM | POA: Diagnosis not present

## 2021-01-19 DIAGNOSIS — M25572 Pain in left ankle and joints of left foot: Secondary | ICD-10-CM | POA: Diagnosis not present

## 2021-01-19 DIAGNOSIS — M9701XA Periprosthetic fracture around internal prosthetic right hip joint, initial encounter: Secondary | ICD-10-CM | POA: Diagnosis not present

## 2021-01-19 DIAGNOSIS — R52 Pain, unspecified: Secondary | ICD-10-CM | POA: Diagnosis not present

## 2021-01-19 DIAGNOSIS — I672 Cerebral atherosclerosis: Secondary | ICD-10-CM | POA: Diagnosis not present

## 2021-01-19 DIAGNOSIS — S72001A Fracture of unspecified part of neck of right femur, initial encounter for closed fracture: Secondary | ICD-10-CM | POA: Diagnosis not present

## 2021-01-19 DIAGNOSIS — S3681XA Injury of peritoneum, initial encounter: Secondary | ICD-10-CM | POA: Diagnosis not present

## 2021-01-19 DIAGNOSIS — M25561 Pain in right knee: Secondary | ICD-10-CM | POA: Diagnosis not present

## 2021-01-19 DIAGNOSIS — I517 Cardiomegaly: Secondary | ICD-10-CM | POA: Diagnosis not present

## 2021-01-19 DIAGNOSIS — M2578 Osteophyte, vertebrae: Secondary | ICD-10-CM | POA: Diagnosis not present

## 2021-01-19 DIAGNOSIS — T84030A Mechanical loosening of internal right hip prosthetic joint, initial encounter: Secondary | ICD-10-CM | POA: Diagnosis not present

## 2021-01-19 DIAGNOSIS — I2510 Atherosclerotic heart disease of native coronary artery without angina pectoris: Secondary | ICD-10-CM | POA: Diagnosis not present

## 2021-01-19 DIAGNOSIS — G319 Degenerative disease of nervous system, unspecified: Secondary | ICD-10-CM | POA: Diagnosis not present

## 2021-01-20 DIAGNOSIS — W19XXXA Unspecified fall, initial encounter: Secondary | ICD-10-CM | POA: Diagnosis not present

## 2021-01-20 DIAGNOSIS — Z96649 Presence of unspecified artificial hip joint: Secondary | ICD-10-CM | POA: Diagnosis not present

## 2021-01-20 DIAGNOSIS — S3681XA Injury of peritoneum, initial encounter: Secondary | ICD-10-CM | POA: Diagnosis not present

## 2021-01-20 DIAGNOSIS — T84030A Mechanical loosening of internal right hip prosthetic joint, initial encounter: Secondary | ICD-10-CM | POA: Diagnosis not present

## 2021-01-20 DIAGNOSIS — S72001A Fracture of unspecified part of neck of right femur, initial encounter for closed fracture: Secondary | ICD-10-CM | POA: Diagnosis not present

## 2021-01-20 DIAGNOSIS — M9701XA Periprosthetic fracture around internal prosthetic right hip joint, initial encounter: Secondary | ICD-10-CM | POA: Diagnosis not present

## 2021-01-21 ENCOUNTER — Inpatient Hospital Stay (HOSPITAL_COMMUNITY)
Admission: AD | Admit: 2021-01-21 | Discharge: 2021-01-29 | DRG: 467 | Disposition: A | Discharge status: Skilled Nursing Facility | Payer: Medicare PPO | Source: Other Acute Inpatient Hospital | Attending: Internal Medicine | Admitting: Internal Medicine

## 2021-01-21 ENCOUNTER — Other Ambulatory Visit: Payer: Self-pay

## 2021-01-21 DIAGNOSIS — M25551 Pain in right hip: Secondary | ICD-10-CM | POA: Diagnosis present

## 2021-01-21 DIAGNOSIS — Z96641 Presence of right artificial hip joint: Secondary | ICD-10-CM | POA: Diagnosis not present

## 2021-01-21 DIAGNOSIS — M9701XA Periprosthetic fracture around internal prosthetic right hip joint, initial encounter: Secondary | ICD-10-CM | POA: Diagnosis not present

## 2021-01-21 DIAGNOSIS — I4819 Other persistent atrial fibrillation: Secondary | ICD-10-CM | POA: Diagnosis not present

## 2021-01-21 DIAGNOSIS — Z96642 Presence of left artificial hip joint: Secondary | ICD-10-CM | POA: Diagnosis present

## 2021-01-21 DIAGNOSIS — W19XXXA Unspecified fall, initial encounter: Secondary | ICD-10-CM | POA: Diagnosis not present

## 2021-01-21 DIAGNOSIS — Y92008 Other place in unspecified non-institutional (private) residence as the place of occurrence of the external cause: Secondary | ICD-10-CM

## 2021-01-21 DIAGNOSIS — I129 Hypertensive chronic kidney disease with stage 1 through stage 4 chronic kidney disease, or unspecified chronic kidney disease: Secondary | ICD-10-CM | POA: Diagnosis present

## 2021-01-21 DIAGNOSIS — S72041A Displaced fracture of base of neck of right femur, initial encounter for closed fracture: Secondary | ICD-10-CM

## 2021-01-21 DIAGNOSIS — I10 Essential (primary) hypertension: Secondary | ICD-10-CM | POA: Diagnosis not present

## 2021-01-21 DIAGNOSIS — S7290XA Unspecified fracture of unspecified femur, initial encounter for closed fracture: Secondary | ICD-10-CM

## 2021-01-21 DIAGNOSIS — E538 Deficiency of other specified B group vitamins: Secondary | ICD-10-CM | POA: Diagnosis present

## 2021-01-21 DIAGNOSIS — N1831 Chronic kidney disease, stage 3a: Secondary | ICD-10-CM | POA: Diagnosis present

## 2021-01-21 DIAGNOSIS — F0390 Unspecified dementia without behavioral disturbance: Secondary | ICD-10-CM | POA: Diagnosis present

## 2021-01-21 DIAGNOSIS — Z853 Personal history of malignant neoplasm of breast: Secondary | ICD-10-CM

## 2021-01-21 DIAGNOSIS — W010XXA Fall on same level from slipping, tripping and stumbling without subsequent striking against object, initial encounter: Secondary | ICD-10-CM | POA: Diagnosis present

## 2021-01-21 DIAGNOSIS — Z7901 Long term (current) use of anticoagulants: Secondary | ICD-10-CM

## 2021-01-21 DIAGNOSIS — S7221XA Displaced subtrochanteric fracture of right femur, initial encounter for closed fracture: Secondary | ICD-10-CM | POA: Diagnosis not present

## 2021-01-21 DIAGNOSIS — M810 Age-related osteoporosis without current pathological fracture: Secondary | ICD-10-CM | POA: Diagnosis not present

## 2021-01-21 DIAGNOSIS — Z170 Estrogen receptor positive status [ER+]: Secondary | ICD-10-CM | POA: Diagnosis not present

## 2021-01-21 DIAGNOSIS — I517 Cardiomegaly: Secondary | ICD-10-CM | POA: Diagnosis not present

## 2021-01-21 DIAGNOSIS — R509 Fever, unspecified: Secondary | ICD-10-CM | POA: Diagnosis not present

## 2021-01-21 DIAGNOSIS — D62 Acute posthemorrhagic anemia: Secondary | ICD-10-CM | POA: Diagnosis not present

## 2021-01-21 DIAGNOSIS — S3681XA Injury of peritoneum, initial encounter: Secondary | ICD-10-CM | POA: Diagnosis not present

## 2021-01-21 DIAGNOSIS — R5381 Other malaise: Secondary | ICD-10-CM | POA: Diagnosis not present

## 2021-01-21 DIAGNOSIS — E782 Mixed hyperlipidemia: Secondary | ICD-10-CM | POA: Diagnosis present

## 2021-01-21 DIAGNOSIS — N390 Urinary tract infection, site not specified: Secondary | ICD-10-CM | POA: Diagnosis not present

## 2021-01-21 DIAGNOSIS — Z7984 Long term (current) use of oral hypoglycemic drugs: Secondary | ICD-10-CM

## 2021-01-21 DIAGNOSIS — Z01810 Encounter for preprocedural cardiovascular examination: Secondary | ICD-10-CM | POA: Diagnosis not present

## 2021-01-21 DIAGNOSIS — Z833 Family history of diabetes mellitus: Secondary | ICD-10-CM

## 2021-01-21 DIAGNOSIS — Z20822 Contact with and (suspected) exposure to covid-19: Secondary | ICD-10-CM | POA: Diagnosis not present

## 2021-01-21 DIAGNOSIS — I2510 Atherosclerotic heart disease of native coronary artery without angina pectoris: Secondary | ICD-10-CM | POA: Diagnosis present

## 2021-01-21 DIAGNOSIS — I4891 Unspecified atrial fibrillation: Secondary | ICD-10-CM | POA: Diagnosis present

## 2021-01-21 DIAGNOSIS — L8990 Pressure ulcer of unspecified site, unspecified stage: Secondary | ICD-10-CM | POA: Insufficient documentation

## 2021-01-21 DIAGNOSIS — Z471 Aftercare following joint replacement surgery: Secondary | ICD-10-CM | POA: Diagnosis not present

## 2021-01-21 DIAGNOSIS — I739 Peripheral vascular disease, unspecified: Secondary | ICD-10-CM | POA: Diagnosis not present

## 2021-01-21 DIAGNOSIS — M2550 Pain in unspecified joint: Secondary | ICD-10-CM | POA: Diagnosis not present

## 2021-01-21 DIAGNOSIS — S7224XA Nondisplaced subtrochanteric fracture of right femur, initial encounter for closed fracture: Principal | ICD-10-CM | POA: Diagnosis present

## 2021-01-21 DIAGNOSIS — I2721 Secondary pulmonary arterial hypertension: Secondary | ICD-10-CM | POA: Diagnosis present

## 2021-01-21 DIAGNOSIS — E1122 Type 2 diabetes mellitus with diabetic chronic kidney disease: Secondary | ICD-10-CM | POA: Diagnosis present

## 2021-01-21 DIAGNOSIS — E119 Type 2 diabetes mellitus without complications: Secondary | ICD-10-CM

## 2021-01-21 DIAGNOSIS — S728X1A Other fracture of right femur, initial encounter for closed fracture: Secondary | ICD-10-CM

## 2021-01-21 DIAGNOSIS — T84030A Mechanical loosening of internal right hip prosthetic joint, initial encounter: Secondary | ICD-10-CM | POA: Diagnosis not present

## 2021-01-21 DIAGNOSIS — M9701XD Periprosthetic fracture around internal prosthetic right hip joint, subsequent encounter: Secondary | ICD-10-CM | POA: Diagnosis not present

## 2021-01-21 DIAGNOSIS — E559 Vitamin D deficiency, unspecified: Secondary | ICD-10-CM | POA: Diagnosis not present

## 2021-01-21 DIAGNOSIS — I4811 Longstanding persistent atrial fibrillation: Secondary | ICD-10-CM

## 2021-01-21 DIAGNOSIS — Z7951 Long term (current) use of inhaled steroids: Secondary | ICD-10-CM | POA: Diagnosis not present

## 2021-01-21 DIAGNOSIS — N189 Chronic kidney disease, unspecified: Secondary | ICD-10-CM | POA: Diagnosis present

## 2021-01-21 DIAGNOSIS — Z7401 Bed confinement status: Secondary | ICD-10-CM | POA: Diagnosis not present

## 2021-01-21 DIAGNOSIS — Z79899 Other long term (current) drug therapy: Secondary | ICD-10-CM

## 2021-01-21 DIAGNOSIS — S72331A Displaced oblique fracture of shaft of right femur, initial encounter for closed fracture: Secondary | ICD-10-CM

## 2021-01-21 DIAGNOSIS — Z96649 Presence of unspecified artificial hip joint: Secondary | ICD-10-CM

## 2021-01-21 HISTORY — DX: Unspecified fracture of unspecified femur, initial encounter for closed fracture: S72.90XA

## 2021-01-21 LAB — GLUCOSE, CAPILLARY: Glucose-Capillary: 161 mg/dL — ABNORMAL HIGH (ref 70–99)

## 2021-01-21 LAB — CBC
HCT: 38.9 % (ref 36.0–46.0)
Hemoglobin: 12.7 g/dL (ref 12.0–15.0)
MCH: 29.5 pg (ref 26.0–34.0)
MCHC: 32.6 g/dL (ref 30.0–36.0)
MCV: 90.3 fL (ref 80.0–100.0)
Platelets: 157 10*3/uL (ref 150–400)
RBC: 4.31 MIL/uL (ref 3.87–5.11)
RDW: 13.2 % (ref 11.5–15.5)
WBC: 10.4 10*3/uL (ref 4.0–10.5)
nRBC: 0 % (ref 0.0–0.2)

## 2021-01-21 LAB — HEMOGLOBIN A1C
Hgb A1c MFr Bld: 7.1 % — ABNORMAL HIGH (ref 4.8–5.6)
Mean Plasma Glucose: 157.07 mg/dL

## 2021-01-21 LAB — CREATININE, SERUM
Creatinine, Ser: 0.74 mg/dL (ref 0.44–1.00)
GFR, Estimated: >60 mL/min (ref >60)

## 2021-01-21 MED ORDER — INSULIN ASPART 100 UNIT/ML IJ SOLN
0.0000 [IU] | Freq: Three times a day (TID) | INTRAMUSCULAR | Status: DC
  Administered 2021-01-22 (×2): 3 [IU] via SUBCUTANEOUS
  Administered 2021-01-22: 11 [IU] via SUBCUTANEOUS
  Administered 2021-01-23: 3 [IU] via SUBCUTANEOUS
  Administered 2021-01-23: 8 [IU] via SUBCUTANEOUS
  Administered 2021-01-23: 11 [IU] via SUBCUTANEOUS
  Administered 2021-01-24 (×2): 3 [IU] via SUBCUTANEOUS
  Administered 2021-01-25: 5 [IU] via SUBCUTANEOUS
  Administered 2021-01-25 (×2): 8 [IU] via SUBCUTANEOUS
  Administered 2021-01-26: 5 [IU] via SUBCUTANEOUS
  Administered 2021-01-26: 8 [IU] via SUBCUTANEOUS
  Administered 2021-01-26: 3 [IU] via SUBCUTANEOUS
  Administered 2021-01-27 – 2021-01-28 (×5): 5 [IU] via SUBCUTANEOUS
  Administered 2021-01-28 – 2021-01-29 (×4): 8 [IU] via SUBCUTANEOUS

## 2021-01-21 MED ORDER — ENOXAPARIN SODIUM 40 MG/0.4ML IJ SOSY
40.0000 mg | PREFILLED_SYRINGE | INTRAMUSCULAR | Status: DC
  Administered 2021-01-21: 40 mg via SUBCUTANEOUS
  Filled 2021-01-21: qty 0.4

## 2021-01-21 MED ORDER — INSULIN ASPART 100 UNIT/ML IJ SOLN
0.0000 [IU] | Freq: Every day | INTRAMUSCULAR | Status: DC
  Administered 2021-01-22: 3 [IU] via SUBCUTANEOUS
  Administered 2021-01-23 – 2021-01-24 (×2): 2 [IU] via SUBCUTANEOUS
  Administered 2021-01-25: 4 [IU] via SUBCUTANEOUS
  Administered 2021-01-26 – 2021-01-28 (×3): 3 [IU] via SUBCUTANEOUS

## 2021-01-21 MED ORDER — ONDANSETRON HCL 4 MG PO TABS: 4.0000 mg | ORAL_TABLET | Freq: Four times a day (QID) | ORAL | Status: DC | PRN

## 2021-01-21 MED ORDER — MUPIROCIN 2 % EX OINT
1.0000 "application " | TOPICAL_OINTMENT | Freq: Two times a day (BID) | CUTANEOUS | Status: DC
  Administered 2021-01-22 – 2021-01-24 (×6): 1 via NASAL
  Filled 2021-01-21 (×2): qty 22

## 2021-01-21 MED ORDER — ACETAMINOPHEN 325 MG PO TABS
650.0000 mg | ORAL_TABLET | Freq: Four times a day (QID) | ORAL | Status: DC | PRN
  Administered 2021-01-22 – 2021-01-27 (×7): 650 mg via ORAL
  Filled 2021-01-21 (×6): qty 2

## 2021-01-21 MED ORDER — ACETAMINOPHEN 650 MG RE SUPP: 650.0000 mg | Freq: Four times a day (QID) | RECTAL | Status: DC | PRN

## 2021-01-21 MED ORDER — MORPHINE SULFATE (PF) 2 MG/ML IV SOLN
2.0000 mg | INTRAVENOUS | Status: DC | PRN
  Administered 2021-01-22 – 2021-01-24 (×10): 2 mg via INTRAVENOUS
  Filled 2021-01-21 (×10): qty 1

## 2021-01-21 MED ORDER — ONDANSETRON HCL 4 MG/2ML IJ SOLN: 4.0000 mg | Freq: Four times a day (QID) | INTRAMUSCULAR | Status: DC | PRN

## 2021-01-21 NOTE — H&P (Signed)
History and Physical   An Lannan RJJ:[id number] DOB: October 18, 1934 DOA: 01/21/2021  Referring MD/NP/PA: [facility] transfer  PCP: Penelope Coop, FNP   Patient coming from: [facility]  Chief Complaint: Femoral fracture  HPI: Lori Rodriguez is a 86 y.o. female with medical history significant of Atrial fibrillation, chronic kidney disease stage III, diabetes, hypertension, hyperlipidemia, history of breast cancer on the left and chronic anticoagulation use due to A. fib with diastolic dysfunction who was sent over from [facility] where she was originally admitted 3 days ago with mechanical fall when she slipped over her rug.  Patient sustained a right nondisplaced fracture of prosthetic hip.  She had prior right hip surgery.  She was therefore deemed more complicated for that hospital.  Patient has CT abdomen that shows small hematoma on the flank regions.  Although her vitals were stable with a hemoglobin of 13 has Xarelto was held for 2 days now.  She was transferred here to have surgery at our facility.  Dr. Lyla Glassing has accepted the patient.  The plan is to do surgery on May 5.  Patient has otherwise no complaint except for the pain.  Denied any fever or chills.  She is therefore being admitted to the hospital for further evaluation and treatment..    Review of Systems: As per HPI otherwise 10 point review of systems negative.    Past Medical History:  Diagnosis Date  . Anemia   . Anginal pain ([place])   . Atrial fibrillation ([place])   . Cancer [facility])    Left Breast Cancer  . Cancer of central portion of left female breast ([place]) 03/10/2016  . CHF (congestive heart failure) ([place])   . Chronic back pain   . Chronic kidney disease    Right Renal Mass  . Diabetes mellitus without complication ([place])   . DJD (degenerative joint disease)   . Dysrhythmia    Atrial fibrillation  . Encounter for preprocedural cardiovascular examination 02/28/2016    Formatting of this note might be different from the original. Overview:  Remote normal coronary arteriography and echo 2015 with normal EF% Formatting of this note might be different from the original. Remote normal coronary arteriography and echo 2015 with normal EF%  . Estrogen receptor positive neoplasm 03/10/2016  . Hypertension   . Hypertensive heart disease 02/28/2016  . Long term (current) use of anticoagulants 02/29/2016   Overview:  Overview:  apixaban Overview:  apixaban  Formatting of this note might be different from the original. Overview:  apixaban Formatting of this note might be different from the original. apixaban  . Mixed hyperlipidemia 02/29/2016  . Orthostatic hypotension 12/01/2018  . Osteoporosis   . Persistent atrial fibrillation ([place]) 02/28/2016   Overview:  Overview:  CHADS2 vasc score= 4  Overview:  CHADS2 vasc score= 4 Overview:  CHADS2 vasc score= 4  Formatting of this note might be different from the original. Overview:  CHADS2 vasc score= 4  Overview:  CHADS2 vasc score= 4 Formatting of this note might be different from the original. CHADS2 vasc score= 4  . Right renal mass   . Secondary pulmonary arterial hypertension ([employer]) 11/03/2018  . Type 2 diabetes mellitus ([place]) 02/29/2016  . Vitamin B12 deficiency   . Vitamin D deficiency     Past Surgical History:  Procedure Laterality Date  . BREAST LUMPECTOMY Left   . CARDIAC CATHETERIZATION    . CATARACT EXTRACTION, BILATERAL    . CHOLECYSTECTOMY    .  IR RADIOLOGIST EVAL & MGMT  09/29/2018  . IR RADIOLOGIST EVAL & MGMT  05/12/2019  . IR RADIOLOGIST EVAL & MGMT  05/22/2020  . TOTAL HIP ARTHROPLASTY Bilateral      reports that she has never smoked. She has never used smokeless tobacco. She reports that she does not drink alcohol and does not use drugs.  Allergies  Allergen Reactions  . Lisinopril Cough  . Percocet [Oxycodone-Acetaminophen] Other (See Comments)    Hallucinations  . Valium [Diazepam] Other (See Comments)     Confusion  . Prednisone Anxiety and Other (See Comments)     Dizziness, weakness     Family History  Problem Relation Age of Onset  . Heart attack Father   . Heart attack Brother   . Diabetes Brother   . Myasthenia gravis Brother      Prior to Admission medications   Medication Sig Start Date End Date Taking? Authorizing Provider  albuterol (PROVENTIL HFA;VENTOLIN HFA) 108 (90 Base) MCG/ACT inhaler Inhale 2 puffs into the lungs 4 (four) times daily as needed.    [provider]  Calcium Carbonate-Vitamin D (CALCIUM-VITAMIN D3) 600-125 MG-UNIT TABS Take 1 capsule by mouth daily.    [provider]  Cyanocobalamin (VITAMIN B-12) 5000 MCG TBDP Take 5,000 mcg by mouth daily. Take 2 tablets daily    [provider]  ferrous sulfate 325 (65 FE) MG tablet Take 325 mg by mouth every other day.     [provider]  fluticasone furoate-vilanterol (BREO ELLIPTA) 100-25 MCG/INH AEPB Place 1 puff into the nose daily. 03/17/18   [provider]  furosemide (LASIX) 20 MG tablet Take 20 mg by mouth daily. Can take second tablet in the evening if weight increases more than 3 lbs 06/06/16   [provider]  glipiZIDE (GLUCOTROL XL) 5 MG 24 hr tablet Take 5 mg by mouth 2 (two) times daily.  12/19/16   [provider]  labetalol (NORMODYNE) 200 MG tablet Take 0.5 tablets (100 mg total) by mouth daily. 12/06/20   Richardo Priest, MD  metFORMIN (GLUCOPHAGE) 1000 MG tablet Take 1,000 mg by mouth 2 (two) times daily with a meal.    [provider]  rosuvastatin (CRESTOR) 40 MG tablet Take 40 mg by mouth daily. 06/19/20   [provider]  sertraline (ZOLOFT) 100 MG tablet Take 100 mg by mouth daily.     [provider]  tolterodine (DETROL LA) 4 MG 24 hr capsule Take 1 capsule by mouth every morning. 05/03/20   [provider]  traMADol (ULTRAM) 50 MG tablet Take 50 mg by mouth daily as needed. 03/27/17   [provider]  Vitamin D, Ergocalciferol, (DRISDOL) 1.25 MG (50000 UT) CAPS capsule Take 50,000 Units by mouth once a week. 06/02/16   [provider]  XARELTO 20 MG TABS tablet TAKE ONE TABLET BY MOUTH AT BEDTIME 01/16/21   Richardo Priest, MD    Physical Exam: Vitals:   01/21/21 2000  BP: (!) 152/90  Pulse: 90  Resp: 18  Temp: 99.7 F (37.6 C)  TempSrc: Oral  Weight: 81.2 kg  Height: 5\' 7"  (1.702 m)      Constitutional: Pleasant, no distress Vitals:   01/21/21 2000  BP: (!) 152/90  Pulse: 90  Resp: 18  Temp: 99.7 F (37.6 C)  TempSrc: Oral  Weight: 81.2 kg  Height: 5\' 7"  (1.702 m)   Eyes: PERRL, lids and conjunctivae normal ENMT: Mucous membranes are moist.  Posterior pharynx clear of any exudate or lesions.Normal dentition.  Neck: normal, supple, no masses, no thyromegaly Respiratory: clear to auscultation bilaterally, no wheezing, no crackles. Normal respiratory effort. No accessory muscle use.  Cardiovascular: Irregularly irregular rate and rhythm, no murmurs / rubs / gallops. No extremity edema. 2+ pedal pulses. No carotid bruits.  Abdomen: no tenderness, no masses palpated. No hepatosplenomegaly. Bowel sounds positive.  Musculoskeletal: no clubbing / cyanosis.  Laterally rotated right lower extremity with ecchymosis good ROM, no contractures. Normal muscle tone.  Skin: no rashes, lesions, ulcers. No induration Neurologic: CN 2-12 grossly intact. Sensation intact, DTR normal. Strength 5/5 in all 4.  Psychiatric: Normal judgment and insight. Alert and oriented x 3. Normal mood.     Labs on Admission: I have personally reviewed following labs and imaging studies  CBC: No results for input(s): WBC, NEUTROABS, HGB, HCT, MCV, PLT in the last 168 hours. Basic Metabolic Panel: No results for input(s): NA, K, CL, CO2, GLUCOSE, BUN, CREATININE, CALCIUM, MG, PHOS in the last 168 hours. GFR: CrCl cannot be calculated (Patient's most recent lab result is older  than the maximum 21 days allowed.). Liver Function Tests: No results for input(s): AST, ALT, ALKPHOS, BILITOT, PROT, ALBUMIN in the last 168 hours. No results for input(s): LIPASE, AMYLASE in the last 168 hours. No results for input(s): AMMONIA in the last 168 hours. Coagulation Profile: No results for input(s): INR, PROTIME in the last 168 hours. Cardiac Enzymes: No results for input(s): CKTOTAL, CKMB, CKMBINDEX, TROPONINI in the last 168 hours. BNP (last 3 results) Recent Labs    05/25/20 1521  PROBNP 587   HbA1C: No results for input(s): HGBA1C in the last 72 hours. CBG: No results for input(s): GLUCAP in the last 168 hours. Lipid Profile: No results for input(s): CHOL, HDL, LDLCALC, TRIG, CHOLHDL, LDLDIRECT in the last 72 hours. Thyroid Function Tests: No results for input(s): TSH, T4TOTAL, FREET4, T3FREE, THYROIDAB in the last 72 hours. Anemia Panel: No results for input(s): VITAMINB12, FOLATE, FERRITIN, TIBC, IRON, RETICCTPCT in the last 72 hours. Urine analysis: No results found for: COLORURINE, APPEARANCEUR, LABSPEC, PHURINE, GLUCOSEU, HGBUR, BILIRUBINUR, KETONESUR, PROTEINUR, UROBILINOGEN, NITRITE, LEUKOCYTESUR Sepsis Labs: @LABRCNTIP (procalcitonin:4,lacticidven:4) )No results found for this or any previous visit (from the past 240 hour(s)).   Radiological Exams on Admission: No results found.  EKG: Independently reviewed.  Pending  Assessment/Plan Principal Problem:   Femur fracture ([place]) Active Problems:   Mixed hyperlipidemia   Long term (current) use of anticoagulants   Atrial fibrillation (H[facility])   Chronic kidney disease   Diabetes mellitus without complication ([place])     #1 right femoral fracture: Periprosthetic.  Holding the Xarelto for now.  We will do Lovenox for prophylaxis until day before surgery.  Get echocardiogram since patient has remote coronary artery disease with no recent echo.  Monitor her vital signs and get her ready for surgery.  #2  atrial fibrillation: We will continue rate control but no anticoagulation.  Place patient on telemetry Maitri.  #3 diabetes: Initiate sliding scale insulin and monitor.  #4 hyperlipidemia: Continue with statin.  #5 chronic kidney disease stage III: Monitor renal function.  #6 coronary artery disease: Remote history.  Placed on telemetry Maitri.  Get echocardiogram  #7 history of breast cancer: Continue follow-up with oncology.  #8 essential hypertension: Confirm home regimen and resume   DVT prophylaxis: Lovenox Code Status: Full code Family Communication: No family at bedside Disposition Plan: To be determined Consults called: Dr. Lyla Glassing Admission status: Inpatient  Severity of Illness: The appropriate patient status for this patient is INPATIENT. Inpatient status is judged to be reasonable and necessary in order to provide the required intensity of service to ensure the patient's safety. The patient's presenting symptoms, physical exam findings, and initial radiographic and laboratory data in the context of their chronic comorbidities is felt to place them at high risk for further clinical deterioration. Furthermore, it is not anticipated that the patient will be medically stable for discharge from the hospital within 2 midnights of admission. The following factors support the patient status of inpatient.   " The patient's presenting symptoms include right hip pain. " The worrisome physical exam findings include laterally rotated hip. " The initial radiographic and laboratory data are worrisome because of right hip fracture. " The chronic co-morbidities include atrial fibrillation and diabetes.   * I certify that at the point of admission it is my clinical judgment that the patient will require inpatient hospital care spanning beyond 2 midnights from the point of admission due to high intensity of service, high risk for further deterioration and high frequency of surveillance  required.Barbette Merino MD [facility] Pager [phone]  If 7PM-7AM, please contact night-coverage [email or web address] Password [facility]  01/21/2021, 8:26 PM

## 2021-01-21 NOTE — Plan of Care (Signed)
Plan of care discussed with Patient.  Bed rest and ice to R hip.  Dr Jonelle Sidle ordered telemetry monitoring and to be transferred to [facility] unit for monitoring D/T report of A-Fib and Hx.  Writer requested Network engineer to contact Patient Placement.  Will enter order in computer when Patient is transferred in computer and able to document.

## 2021-01-21 NOTE — Progress Notes (Signed)
Report called to [facility] at 2158.  Transported Patient to [id number] at 2230 via bed.  Informed Receiving staff that Admission assessment is incomplete in the computer.  Informed Nurse of observed blanchable redness of buttocks and Sacral foam dressing applied at 2000 and 500 cc via purewick and meds due.

## 2021-01-22 ENCOUNTER — Encounter (HOSPITAL_COMMUNITY): Payer: Self-pay | Admitting: Internal Medicine

## 2021-01-22 ENCOUNTER — Inpatient Hospital Stay (HOSPITAL_COMMUNITY): Payer: Medicare PPO

## 2021-01-22 ENCOUNTER — Other Ambulatory Visit (HOSPITAL_COMMUNITY): Payer: Medicare PPO

## 2021-01-22 LAB — SURGICAL PCR SCREEN
MRSA, PCR: NEGATIVE
Staphylococcus aureus: NEGATIVE

## 2021-01-22 LAB — CBC
HCT: 39.4 % (ref 36.0–46.0)
Hemoglobin: 12.7 g/dL (ref 12.0–15.0)
MCH: 28.9 pg (ref 26.0–34.0)
MCHC: 32.2 g/dL (ref 30.0–36.0)
MCV: 89.5 fL (ref 80.0–100.0)
Platelets: 164 10*3/uL (ref 150–400)
RBC: 4.4 MIL/uL (ref 3.87–5.11)
RDW: 13.2 % (ref 11.5–15.5)
WBC: 12 10*3/uL — ABNORMAL HIGH (ref 4.0–10.5)
nRBC: 0 % (ref 0.0–0.2)

## 2021-01-22 LAB — COMPREHENSIVE METABOLIC PANEL
ALT: 13 U/L (ref 0–44)
AST: 16 U/L (ref 15–41)
Albumin: 3.8 g/dL (ref 3.5–5.0)
Alkaline Phosphatase: 30 U/L — ABNORMAL LOW (ref 38–126)
Anion gap: 10 (ref 5–15)
BUN: 17 mg/dL (ref 8–23)
CO2: 24 mmol/L (ref 22–32)
Calcium: 9.1 mg/dL (ref 8.9–10.3)
Chloride: 101 mmol/L (ref 98–111)
Creatinine, Ser: 0.62 mg/dL (ref 0.44–1.00)
GFR, Estimated: >60 mL/min (ref >60)
Glucose, Bld: 178 mg/dL — ABNORMAL HIGH (ref 70–99)
Potassium: 4 mmol/L (ref 3.5–5.1)
Sodium: 135 mmol/L (ref 135–145)
Total Bilirubin: 0.8 mg/dL (ref 0.3–1.2)
Total Protein: 6.9 g/dL (ref 6.5–8.1)

## 2021-01-22 LAB — GLUCOSE, CAPILLARY
Glucose-Capillary: 191 mg/dL — ABNORMAL HIGH (ref 70–99)
Glucose-Capillary: 326 mg/dL — ABNORMAL HIGH (ref 70–99)
Glucose-Capillary: 186 mg/dL — ABNORMAL HIGH (ref 70–99)
Glucose-Capillary: 264 mg/dL — ABNORMAL HIGH (ref 70–99)

## 2021-01-22 LAB — HEPARIN LEVEL (UNFRACTIONATED)
Heparin Unfractionated: 0.13 IU/mL — ABNORMAL LOW (ref 0.30–0.70)
Heparin Unfractionated: 0.12 IU/mL — ABNORMAL LOW (ref 0.30–0.70)

## 2021-01-22 LAB — APTT: aPTT: 31 seconds (ref 24–36)

## 2021-01-22 MED ORDER — HEPARIN BOLUS VIA INFUSION
2000.0000 [IU] | Freq: Once | INTRAVENOUS | Status: AC
  Administered 2021-01-22: 2000 [IU] via INTRAVENOUS
  Filled 2021-01-22: qty 2000

## 2021-01-22 MED ORDER — HEPARIN (PORCINE) 25000 UT/250ML-% IV SOLN
1550.0000 [IU]/h | INTRAVENOUS | Status: DC
  Administered 2021-01-22: 1100 [IU]/h via INTRAVENOUS
  Administered 2021-01-23: 1350 [IU]/h via INTRAVENOUS
  Filled 2021-01-22 (×3): qty 250

## 2021-01-22 MED ORDER — LABETALOL HCL 5 MG/ML IV SOLN: 20.0000 mg | INTRAVENOUS | Status: DC | PRN

## 2021-01-22 MED ORDER — INSULIN GLARGINE 100 UNIT/ML ~~LOC~~ SOLN
15.0000 [IU] | Freq: Every day | SUBCUTANEOUS | Status: DC
  Administered 2021-01-22 – 2021-01-23 (×2): 15 [IU] via SUBCUTANEOUS
  Filled 2021-01-22 (×2): qty 0.15

## 2021-01-22 MED ORDER — SERTRALINE HCL 100 MG PO TABS
100.0000 mg | ORAL_TABLET | Freq: Every day | ORAL | Status: DC
  Administered 2021-01-22 – 2021-01-29 (×8): 100 mg via ORAL
  Filled 2021-01-22 (×8): qty 1

## 2021-01-22 MED ORDER — LABETALOL HCL 100 MG PO TABS
100.0000 mg | ORAL_TABLET | Freq: Two times a day (BID) | ORAL | Status: DC
  Administered 2021-01-22 – 2021-01-29 (×15): 100 mg via ORAL
  Filled 2021-01-22 (×15): qty 1

## 2021-01-22 MED ORDER — METOPROLOL TARTRATE 5 MG/5ML IV SOLN
5.0000 mg | Freq: Four times a day (QID) | INTRAVENOUS | Status: DC | PRN
  Administered 2021-01-23: 5 mg via INTRAVENOUS
  Filled 2021-01-22: qty 5

## 2021-01-22 NOTE — Progress Notes (Signed)
Patient turned yellow MEWS for HR 100-117 and B/P 155/75 also with c/o right hip pain 7/10, notified on call provider Dr Sidney Ace and charge nurse Hoover RN.  Received call from Dr Sidney Ace and will place prn labetalol orders to help with heart rate and b/p control.

## 2021-01-22 NOTE — Plan of Care (Signed)

## 2021-01-22 NOTE — H&P (View-Only) (Signed)
ORTHOPAEDIC CONSULTATION  REQUESTING PHYSICIAN: Shelly Coss, MD  PCP:  Penelope Coop, FNP  Chief Complaint: Right femoral fracture  HPI: Lori Rodriguez is a 86 y.o. female who complains of Right hip pain secondary to a fall.  Patient has past medical history significant for atrial fibrillation, chronic kidney disease stage III, diabetes, hypertension, hyperlipidemia, breast cancer history of the left, chronic anticoagulation use due to atrial fibrillation with diastolic dysfunction.  The patient was admitted [facility] 3 days ago after slipping on her role injuring her right hip.  She was found to have a non-displaced fracture on the right of Her prosthetic Hip.The periprosthetic fracture was deemed to be too complicated for [facility] she was transferred to [facility] long in order to have evaluation and management and surgery.  Dr. Lyla Glassing was consulted For orthopedic management.  Past Medical History:  Diagnosis Date  . Anemia   . Anginal pain ([place])   . Atrial fibrillation ([place])   . Cancer [facility])    Left Breast Cancer  . Cancer of central portion of left female breast ([place]) 03/10/2016  . CHF (congestive heart failure) ([place])   . Chronic back pain   . Chronic kidney disease    Right Renal Mass  . Diabetes mellitus without complication ([place])   . DJD (degenerative joint disease)   . Dysrhythmia    Atrial fibrillation  . Encounter for preprocedural cardiovascular examination 02/28/2016   Formatting of this note might be different from the original. Overview:  Remote normal coronary arteriography and echo 2015 with normal EF% Formatting of this note might be different from the original. Remote normal coronary arteriography and echo 2015 with normal EF%  . Estrogen receptor positive neoplasm 03/10/2016  . Hypertension   . Hypertensive heart disease 02/28/2016  . Long term (current) use of anticoagulants 02/29/2016   Overview:  Overview:  apixaban Overview:   apixaban  Formatting of this note might be different from the original. Overview:  apixaban Formatting of this note might be different from the original. apixaban  . Mixed hyperlipidemia 02/29/2016  . Orthostatic hypotension 12/01/2018  . Osteoporosis   . Persistent atrial fibrillation ([place]) 02/28/2016   Overview:  Overview:  CHADS2 vasc score= 4  Overview:  CHADS2 vasc score= 4 Overview:  CHADS2 vasc score= 4  Formatting of this note might be different from the original. Overview:  CHADS2 vasc score= 4  Overview:  CHADS2 vasc score= 4 Formatting of this note might be different from the original. CHADS2 vasc score= 4  . Right renal mass   . Secondary pulmonary arterial hypertension ([place]) 11/03/2018  . Type 2 diabetes mellitus ([place]) 02/29/2016  . Vitamin B12 deficiency   . Vitamin D deficiency    Past Surgical History:  Procedure Laterality Date  . BREAST LUMPECTOMY Left   . CARDIAC CATHETERIZATION    . CATARACT EXTRACTION, BILATERAL    . CHOLECYSTECTOMY    . IR RADIOLOGIST EVAL & MGMT  09/29/2018  . IR RADIOLOGIST EVAL & MGMT  05/12/2019  . IR RADIOLOGIST EVAL & MGMT  05/22/2020  . TOTAL HIP ARTHROPLASTY Bilateral    Social History   Socioeconomic History  . Marital status: Widowed    Spouse name: Not on file  . Number of children: Not on file  . Years of education: Not on file  . Highest education level: Not on file  Occupational History  . Not on file  Tobacco Use  . Smoking status: Never Smoker  .  Smokeless tobacco: Never Used  Vaping Use  . Vaping Use: Never used  Substance and Sexual Activity  . Alcohol use: Never  . Drug use: Never  . Sexual activity: Not on file  Other Topics Concern  . Not on file  Social History Narrative  . Not on file   Social Determinants of Health   Financial Resource Strain: Not on file  Food Insecurity: Not on file  Transportation Needs: Not on file  Physical Activity: Not on file  Stress: Not on file  Social Connections: Not on file    Family History  Problem Relation Age of Onset  . Heart attack Father   . Heart attack Brother   . Diabetes Brother   . Myasthenia gravis Brother    Allergies  Allergen Reactions  . Lisinopril Cough  . Percocet [Oxycodone-Acetaminophen] Other (See Comments)    Hallucinations  . Valium [Diazepam] Other (See Comments)    Confusion  . Prednisone Anxiety and Other (See Comments)     Dizziness, weakness    Prior to Admission medications   Medication Sig Start Date End Date Taking? Authorizing Provider  acetaminophen (TYLENOL) 650 MG CR tablet Take 650 mg by mouth every 8 (eight) hours as needed for pain.   Yes [provider]  albuterol (PROVENTIL HFA;VENTOLIN HFA) 108 (90 Base) MCG/ACT inhaler Inhale 2 puffs into the lungs 4 (four) times daily as needed for wheezing or shortness of breath.   Yes [provider]  Calcium Carbonate-Vitamin D (CALCIUM-VITAMIN D3) 600-125 MG-UNIT TABS Take 1 capsule by mouth daily.   Yes [provider]  Cyanocobalamin (VITAMIN B-12) 5000 MCG TBDP Take 5,000 mcg by mouth daily. Take 2 tablets daily   Yes [provider]  ferrous sulfate 325 (65 FE) MG tablet Take 325 mg by mouth every other day.    Yes [provider]  fluticasone furoate-vilanterol (BREO ELLIPTA) 100-25 MCG/INH AEPB Place 1 puff into the nose daily. 03/17/18  Yes [provider]  furosemide (LASIX) 20 MG tablet Take 20 mg by mouth daily. Can take second tablet in the evening if weight increases more than 3 lbs 06/06/16  Yes [provider]  glipiZIDE (GLUCOTROL XL) 5 MG 24 hr tablet Take 5 mg by mouth 2 (two) times daily.  12/19/16  Yes [provider]  labetalol (NORMODYNE) 200 MG tablet Take 0.5 tablets (100 mg total) by mouth daily. Patient taking differently: Take 100 mg by mouth 2 (two) times daily. 12/06/20  Yes Richardo Priest, MD  metFORMIN (GLUCOPHAGE) 1000 MG tablet Take 1,000 mg by mouth 2 (two) times daily with  a meal.   Yes [provider]  rosuvastatin (CRESTOR) 40 MG tablet Take 40 mg by mouth daily. 06/19/20  Yes [provider]  sertraline (ZOLOFT) 100 MG tablet Take 100 mg by mouth daily.    Yes [provider]  tolterodine (DETROL LA) 4 MG 24 hr capsule Take 4 mg by mouth every morning. 05/03/20  Yes [provider]  traMADol (ULTRAM) 50 MG tablet Take 50 mg by mouth daily as needed for severe pain. 03/27/17  Yes [provider]  Vitamin D, Ergocalciferol, (DRISDOL) 1.25 MG (50000 UT) CAPS capsule Take 50,000 Units by mouth once a week. 06/02/16  Yes [provider]  XARELTO 20 MG TABS tablet TAKE ONE TABLET BY MOUTH AT BEDTIME Patient taking differently: Take 20 mg by mouth daily with supper. 01/16/21  Yes Richardo Priest, MD   No results  found.  Positive ROS: All other systems have been reviewed and were otherwise negative with the exception of those mentioned in the HPI and as above.  Physical Exam: General: Alert, no acute distress Cardiovascular: No pedal edema Respiratory: No cyanosis, no use of accessory musculature GI: No organomegaly, abdomen is soft and non-tender Skin: No lesions in the area of chief complaint Neurologic: Sensation intact distally Psychiatric: Patient is competent for consent with normal mood and affect Lymphatic: No axillary or cervical lymphadenopathy  MUSCULOSKELETAL: Right lower extremity laterally rotated with ecchymosis noted.  Tender to palpation of the right hip.  Pulse movement sensation intact at the right foot with good plantar and dorsiflexion  Assessment: Right [place] B2 Periprosthetic femur fracture  Plan: I discussed the findings with the patient.  We discussed her imaging results.  She has a periprosthetic fracture around the femoral component with subsidence.  Since she has a loose, unstable femoral component, she will require femoral component revision.  We discussed the risks, benefits, and  alternatives.  She understands that the biggest risk include infection and hip instability.  Plan for surgery on Thursday.Nothing by mouth after midnight on Wednesday night.   Discontinue heparin drip at 0100 on Thursday morning.  All questions solicited and answered.     Dorothyann Peng, PA [phone]    01/22/2021 9:48 AM

## 2021-01-22 NOTE — Progress Notes (Signed)
[place] for Heparin Indication: bridging from Xarelto for AFib for Hip fx repair  Allergies  Allergen Reactions  . Lisinopril Cough  . Percocet [Oxycodone-Acetaminophen] Other (See Comments)    Hallucinations  . Valium [Diazepam] Other (See Comments)    Confusion  . Prednisone Anxiety and Other (See Comments)     Dizziness, weakness    Patient Measurements: Height: 5\' 7"  (170.2 cm) Weight: 82.3 kg (181 lb 7 oz) IBW/kg (Calculated) : 61.6 Heparin Dosing Weight: 78.6 kg  Vital Signs: Temp: 101.4 F (38.6 C) (05/03 0629) Temp Source: Oral (05/03 0629) BP: 161/85 (05/03 0629) Pulse Rate: 126 (05/03 0629)  Labs: Recent Labs    01/21/21 2058 01/22/21 0328  HGB 12.7 12.7  HCT 38.9 39.4  PLT 157 164  CREATININE 0.74 0.62   Estimated Creatinine Clearance: 58.8 mL/min (by C-G formula based on SCr of 0.62 mg/dL).  Medical History: Past Medical History:  Diagnosis Date  . Anemia   . Anginal pain ([place])   . Atrial fibrillation ([place])   . Cancer [facility])    Left Breast Cancer  . Cancer of central portion of left female breast ([place]) 03/10/2016  . CHF (congestive heart failure) ([place])   . Chronic back pain   . Chronic kidney disease    Right Renal Mass  . Diabetes mellitus without complication ([place])   . DJD (degenerative joint disease)   . Dysrhythmia    Atrial fibrillation  . Encounter for preprocedural cardiovascular examination 02/28/2016   Formatting of this note might be different from the original. Overview:  Remote normal coronary arteriography and echo 2015 with normal EF% Formatting of this note might be different from the original. Remote normal coronary arteriography and echo 2015 with normal EF%  . Estrogen receptor positive neoplasm 03/10/2016  . Hypertension   . Hypertensive heart disease 02/28/2016  . Long term (current) use of anticoagulants 02/29/2016   Overview:  Overview:  apixaban Overview:  apixaban  Formatting of this  note might be different from the original. Overview:  apixaban Formatting of this note might be different from the original. apixaban  . Mixed hyperlipidemia 02/29/2016  . Orthostatic hypotension 12/01/2018  . Osteoporosis   . Persistent atrial fibrillation ([place]) 02/28/2016   Overview:  Overview:  CHADS2 vasc score= 4  Overview:  CHADS2 vasc score= 4 Overview:  CHADS2 vasc score= 4  Formatting of this note might be different from the original. Overview:  CHADS2 vasc score= 4  Overview:  CHADS2 vasc score= 4 Formatting of this note might be different from the original. CHADS2 vasc score= 4  . Right renal mass   . Secondary pulmonary arterial hypertension ([place]) 11/03/2018  . Type 2 diabetes mellitus ([place]) 02/29/2016  . Vitamin B12 deficiency   . Vitamin D deficiency     Medications:  Scheduled:  . insulin aspart  0-15 Units Subcutaneous TID WC  . insulin aspart  0-5 Units Subcutaneous QHS  . labetalol  100 mg Oral BID  . mupirocin ointment  1 application Nasal BID  . sertraline  100 mg Oral Daily   Infusions:    Assessment: 9 yoF presented to [facility] with [facility] hip Fx. Transfer to [facility] on 5/2, plan Hip fx repair 5/5.  Hx Afib on Xarelto 20mg  qd, per notes was held at [place]. Was noted in Afib with RVR on arrival to [facility]  Heparin infusion for bridging around surgery, ordered baseline aPTT and Hep level - both low Baseline aPTT &  Hep level low as expected  Goal of Therapy:  Heparin level 0.3-0.7 units/ml Monitor platelets by anticoagulation protocol: Yes   Plan:  Heparin bolus 2000 units, infusion at 1100 units/hr Check 8 hr Heparin level Daily CBC ordered, begin daily Heparin level at steady state Ortho MD to determine time to hold Heparin prior to surgery 5/5  Minda Ditto PharmD 01/22/2021,9:32 AM

## 2021-01-22 NOTE — Progress Notes (Signed)
[place] for Heparin Indication: bridging from Xarelto for AFib for Hip fx repair  Allergies  Allergen Reactions  . Lisinopril Cough  . Percocet [Oxycodone-Acetaminophen] Other (See Comments)    Hallucinations  . Valium [Diazepam] Other (See Comments)    Confusion  . Prednisone Anxiety and Other (See Comments)     Dizziness, weakness    Patient Measurements: Height: 5\' 7"  (170.2 cm) Weight: 82.3 kg (181 lb 7 oz) IBW/kg (Calculated) : 61.6 Heparin Dosing Weight: 78.6 kg  Vital Signs: Temp: 99.5 F (37.5 C) (05/03 2053) Temp Source: Oral (05/03 2053) BP: 131/62 (05/03 2053) Pulse Rate: 87 (05/03 2053)  Labs: Recent Labs    01/21/21 2058 01/22/21 0328 01/22/21 1100 01/22/21 2103  HGB 12.7 12.7  --   --   HCT 38.9 39.4  --   --   PLT 157 164  --   --   APTT  --   --  31  --   HEPARINUNFRC  --   --  0.13* 0.12*  CREATININE 0.74 0.62  --   --    Estimated Creatinine Clearance: 58.8 mL/min (by C-G formula based on SCr of 0.62 mg/dL).  Medical History: Past Medical History:  Diagnosis Date  . Anemia   . Anginal pain ([place])   . Atrial fibrillation ([place])   . Cancer [facility])    Left Breast Cancer  . Cancer of central portion of left female breast ([place]) 03/10/2016  . CHF (congestive heart failure) ([place])   . Chronic back pain   . Chronic kidney disease    Right Renal Mass  . Diabetes mellitus without complication ([place])   . DJD (degenerative joint disease)   . Dysrhythmia    Atrial fibrillation  . Encounter for preprocedural cardiovascular examination 02/28/2016   Formatting of this note might be different from the original. Overview:  Remote normal coronary arteriography and echo 2015 with normal EF% Formatting of this note might be different from the original. Remote normal coronary arteriography and echo 2015 with normal EF%  . Estrogen receptor positive neoplasm 03/10/2016  . Hypertension   . Hypertensive heart disease 02/28/2016   . Long term (current) use of anticoagulants 02/29/2016   Overview:  Overview:  apixaban Overview:  apixaban  Formatting of this note might be different from the original. Overview:  apixaban Formatting of this note might be different from the original. apixaban  . Mixed hyperlipidemia 02/29/2016  . Orthostatic hypotension 12/01/2018  . Osteoporosis   . Persistent atrial fibrillation ([place]) 02/28/2016   Overview:  Overview:  CHADS2 vasc score= 4  Overview:  CHADS2 vasc score= 4 Overview:  CHADS2 vasc score= 4  Formatting of this note might be different from the original. Overview:  CHADS2 vasc score= 4  Overview:  CHADS2 vasc score= 4 Formatting of this note might be different from the original. CHADS2 vasc score= 4  . Right renal mass   . Secondary pulmonary arterial hypertension ([place]) 11/03/2018  . Type 2 diabetes mellitus ([place]) 02/29/2016  . Vitamin B12 deficiency   . Vitamin D deficiency     Medications:  Scheduled:  . insulin aspart  0-15 Units Subcutaneous TID WC  . insulin aspart  0-5 Units Subcutaneous QHS  . insulin glargine  15 Units Subcutaneous Daily  . labetalol  100 mg Oral BID  . mupirocin ointment  1 application Nasal BID  . sertraline  100 mg Oral Daily   Infusions:  . heparin 1,100 Units/hr (01/22/21  1246)    Assessment: 84 yoF [facility] with [facility] hip Fx. [facility] to [facility] on 5/2, plan Hip fx repair 5/5.  Hx Afib on Xarelto 20mg  qd, per notes was held at [place]. Was noted in Afib with RVR on arrival to [facility]  Heparin infusion for bridging around surgery, ordered baseline aPTT and Hep level - both low Baseline aPTT & Hep level low as expected  Today, 01/22/2021 PM:  First heparin level unchanged from baseline on 1100 units/hr - per RN, heparin interrupted for ~1 hr approximately 45 min before heparin level drawn  No bleeding issues per RN  Goal of Therapy:  Heparin level 0.3-0.7 units/ml Monitor platelets by anticoagulation protocol: Yes   Plan:    Continue heparin at 1100 units/hr  Recheck level once returned to steady state (8 hr)  Daily CBC ordered, begin daily Heparin level at steady state  Ortho MD to determine time to hold Heparin prior to surgery 5/5  Lori Rodriguez A PharmD 01/22/2021,9:58 PM

## 2021-01-22 NOTE — Progress Notes (Signed)
PROGRESS NOTE    Lori Rodriguez  NIO:[id number] DOB: 10-14-1934 DOA: 01/21/2021 PCP: Penelope Coop, FNP   Chief Complain: Transfer from [facility]  Brief Narrative: Patient is a 86 year old female with history of paroxysmal A. fib on anticoagulation with Xarelto, CKD status 3A, diabetes type 2, hypertension, hyperlipidemia, history of breast cancer, diastolic dysfunction who was sent from [facility] for the management of right nondisplaced fracture of her prosthetic hip as per the report she had a mechanical fall when she slipped over a rug.  Orthopedics planning for ORIF on May 5.  She went into A. fib with RVR after admission here.  Assessment & Plan:   Principal Problem:   Femur fracture ([place]) Active Problems:   Mixed hyperlipidemia   Long term (current) use of anticoagulants   Atrial fibrillation (H[facility])   Chronic kidney disease   Diabetes mellitus without complication ([place])   Right femoral periprosthetic fracture: Transferred here from [facility] for orthopedic management.  Continue pain management, supportive care. PT/OT will consult after surgery.  A. fib with RVR: Takes labetalol for rate control, on Xarelto for anticoagulation.  She was on A. fib with RVR after admission here.  Started heparin drip for anticoagulation.  Xarelto can be started after surgery. Heart rate well controlled after restarting labetalol.  Diabetes type 2: Monitor blood sugars.  Continue sliding scale insulin and Lantus.  Recent hemoglobin A1c of 7.1.  Hyperlipidemia: Continue statin  CKD stage IIIa: Currently kidney function at baseline  Coronary artery disease: Denies any chest pain.  Currently stable.  Echocardiogram has been ordered.Follows with Dr Bettina Gavia  History of breast cancer: We recommend oncology follow-up as an outpatient  Hypertension: Currently blood pressure stable.  Continue as needed medications for severe hypertension.  Leukocytosis: Mild.  Most  likely reactive.  Continue to monitor  History of depression: On Zoloft         DVT prophylaxis:Heparin IV Code Status: Full Family Communication: Called daughter on phone today Status is: Inpatient  Remains inpatient appropriate because:Inpatient level of care appropriate due to severity of illness   Dispo: The patient is from: Home              Anticipated d/c is to: SNF              Patient currently is not medically stable to d/c.   Difficult to place patient No    Consultants: Ortho  Procedures:None  Antimicrobials:  Anti-infectives (From admission, onward)   None      Subjective:  Patient seen and examined at bedside this morning but hemodynamically stable.  Comfortable.  Pain well controlled.  She was in A. fib with RVR when during my evaluation.  Denies any chest pain or shortness of breath.  Objective: Vitals:   01/21/21 2235 01/22/21 0203 01/22/21 0501 01/22/21 0629  BP: (!) 154/86 (!) 142/87 (!) 155/75 (!) 161/85  Pulse: 84 (!) 105 (!) 117 (!) 126  Resp: 18 19 18 16   Temp: 98.6 F (37 C) 98.2 F (36.8 C) 99.5 F (37.5 C) (!) 101.4 F (38.6 C)  TempSrc: Axillary Oral Oral Oral  SpO2: 98% 98% 96% 91%  Weight: 82.3 kg     Height: 5\' 7"  (1.702 m)       Intake/Output Summary (Last 24 hours) at 01/22/2021 0819 Last data filed at 01/22/2021 0500 Gross per 24 hour  Intake 120 ml  Output 900 ml  Net -780 ml   Filed Weights   01/21/21  2000 01/21/21 2235  Weight: 81.2 kg 82.3 kg    Examination:  General exam: Appears calm and comfortable ,Not in distress,average built HEENT:PERRL,Oral mucosa moist, Ear/Nose normal on gross exam Respiratory system: Bilateral equal air entry, normal vesicular breath sounds, few crackles auscultated on the bases Cardiovascular system: Irregularly irregular rhythm, no JVD, murmurs, rubs, gallops or clicks. No pedal edema. Gastrointestinal system: Abdomen is nondistended, soft and nontender. No organomegaly or masses  felt. Normal bowel sounds heard. Central nervous system: Alert and oriented. No focal neurological deficits. Extremities: No edema, no clubbing ,no cyanosis, tenderness on the right hip Skin: No rashes, lesions or ulcers,no icterus ,no pallor     Data Reviewed: I have personally reviewed following labs and imaging studies  CBC: Recent Labs  Lab 01/21/21 2058 01/22/21 0328  WBC 10.4 12.0*  HGB 12.7 12.7  HCT 38.9 39.4  MCV 90.3 89.5  PLT 157 213   Basic Metabolic Panel: Recent Labs  Lab 01/21/21 2058 01/22/21 0328  NA  --  135  K  --  4.0  CL  --  101  CO2  --  24  GLUCOSE  --  178*  BUN  --  17  CREATININE 0.74 0.62  CALCIUM  --  9.1   GFR: Estimated Creatinine Clearance: 58.8 mL/min (by C-G formula based on SCr of 0.62 mg/dL). Liver Function Tests: Recent Labs  Lab 01/22/21 0328  AST 16  ALT 13  ALKPHOS 30*  BILITOT 0.8  PROT 6.9  ALBUMIN 3.8   No results for input(s): LIPASE, AMYLASE in the last 168 hours. No results for input(s): AMMONIA in the last 168 hours. Coagulation Profile: No results for input(s): INR, PROTIME in the last 168 hours. Cardiac Enzymes: No results for input(s): CKTOTAL, CKMB, CKMBINDEX, TROPONINI in the last 168 hours. BNP (last 3 results) Recent Labs    05/25/20 1521  PROBNP 587   HbA1C: Recent Labs    01/21/21 2058  HGBA1C 7.1*   CBG: Recent Labs  Lab 01/21/21 2246 01/22/21 0738  GLUCAP 161* 191*   Lipid Profile: No results for input(s): CHOL, HDL, LDLCALC, TRIG, CHOLHDL, LDLDIRECT in the last 72 hours. Thyroid Function Tests: No results for input(s): TSH, T4TOTAL, FREET4, T3FREE, THYROIDAB in the last 72 hours. Anemia Panel: No results for input(s): VITAMINB12, FOLATE, FERRITIN, TIBC, IRON, RETICCTPCT in the last 72 hours. Sepsis Labs: No results for input(s): PROCALCITON, LATICACIDVEN in the last 168 hours.  Recent Results (from the past 240 hour(s))  Surgical PCR screen     Status: None   Collection Time:  01/21/21 10:54 PM   Specimen: Nasal Mucosa; Nasal Swab  Result Value Ref Range Status   MRSA, PCR NEGATIVE NEGATIVE Final   Staphylococcus aureus NEGATIVE NEGATIVE Final    Comment: (NOTE) The Xpert SA Assay (FDA approved for NASAL specimens in patients 46 years of age and older), is one component of a comprehensive surveillance program. It is not intended to diagnose infection nor to guide or monitor treatment. Performed at [facility] [place] [address] [place], [place] [address]          Radiology Studies: No results found.      Scheduled Meds: . enoxaparin (LOVENOX) injection  40 mg Subcutaneous Q24H  . insulin aspart  0-15 Units Subcutaneous TID WC  . insulin aspart  0-5 Units Subcutaneous QHS  . mupirocin ointment  1 application Nasal BID  . sertraline  100 mg Oral Daily   Continuous Infusions:  LOS: 1 day    Time spent: 35 mins.More than 50% of that time was spent in counseling and/or coordination of care.      Shelly Coss, MD [facility] P5/11/2020, 8:19 AM

## 2021-01-22 NOTE — Consult Note (Signed)
  ORTHOPAEDIC CONSULTATION  REQUESTING PHYSICIAN: Adhikari, Amrit, MD  PCP:  Perry, Melisa W, FNP  Chief Complaint: Right femoral fracture  HPI: Lori Rodriguez is a 86 y.o. female who complains of Right hip pain secondary to a fall.  Patient has past medical history significant for atrial fibrillation, chronic kidney disease stage III, diabetes, hypertension, hyperlipidemia, breast cancer history of the left, chronic anticoagulation use due to atrial fibrillation with diastolic dysfunction.  The patient was admitted [facility] 3 days ago after slipping on her role injuring her right hip.  She was found to have a non-displaced fracture on the right of Her prosthetic Hip.The periprosthetic fracture was deemed to be too complicated for [facility] she was transferred to [facility] in order to have evaluation and management and surgery.  Dr. Swinteck was consulted For orthopedic management.  Past Medical History:  Diagnosis Date  . Anemia   . Anginal pain (HCC)   . Atrial fibrillation (HCC)   . Cancer (HCC)    Left Breast Cancer  . Cancer of central portion of left female breast (HCC) 03/10/2016  . CHF (congestive heart failure) (HCC)   . Chronic back pain   . Chronic kidney disease    Right Renal Mass  . Diabetes mellitus without complication (HCC)   . DJD (degenerative joint disease)   . Dysrhythmia    Atrial fibrillation  . Encounter for preprocedural cardiovascular examination 02/28/2016   Formatting of this note might be different from the original. Overview:  Remote normal coronary arteriography and echo 2015 with normal EF% Formatting of this note might be different from the original. Remote normal coronary arteriography and echo 2015 with normal EF%  . Estrogen receptor positive neoplasm 03/10/2016  . Hypertension   . Hypertensive heart disease 02/28/2016  . Long term (current) use of anticoagulants 02/29/2016   Overview:  Overview:  apixaban Overview:   apixaban  Formatting of this note might be different from the original. Overview:  apixaban Formatting of this note might be different from the original. apixaban  . Mixed hyperlipidemia 02/29/2016  . Orthostatic hypotension 12/01/2018  . Osteoporosis   . Persistent atrial fibrillation (HCC) 02/28/2016   Overview:  Overview:  CHADS2 vasc score= 4  Overview:  CHADS2 vasc score= 4 Overview:  CHADS2 vasc score= 4  Formatting of this note might be different from the original. Overview:  CHADS2 vasc score= 4  Overview:  CHADS2 vasc score= 4 Formatting of this note might be different from the original. CHADS2 vasc score= 4  . Right renal mass   . Secondary pulmonary arterial hypertension (HCC) 11/03/2018  . Type 2 diabetes mellitus (HCC) 02/29/2016  . Vitamin B12 deficiency   . Vitamin D deficiency    Past Surgical History:  Procedure Laterality Date  . BREAST LUMPECTOMY Left   . CARDIAC CATHETERIZATION    . CATARACT EXTRACTION, BILATERAL    . CHOLECYSTECTOMY    . IR RADIOLOGIST EVAL & MGMT  09/29/2018  . IR RADIOLOGIST EVAL & MGMT  05/12/2019  . IR RADIOLOGIST EVAL & MGMT  05/22/2020  . TOTAL HIP ARTHROPLASTY Bilateral    Social History   Socioeconomic History  . Marital status: Widowed    Spouse name: Not on file  . Number of children: Not on file  . Years of education: Not on file  . Highest education level: Not on file  Occupational History  . Not on file  Tobacco Use  . Smoking status: Never Smoker  .   Smokeless tobacco: Never Used  Vaping Use  . Vaping Use: Never used  Substance and Sexual Activity  . Alcohol use: Never  . Drug use: Never  . Sexual activity: Not on file  Other Topics Concern  . Not on file  Social History Narrative  . Not on file   Social Determinants of Health   Financial Resource Strain: Not on file  Food Insecurity: Not on file  Transportation Needs: Not on file  Physical Activity: Not on file  Stress: Not on file  Social Connections: Not on file    Family History  Problem Relation Age of Onset  . Heart attack Father   . Heart attack Brother   . Diabetes Brother   . Myasthenia gravis Brother    Allergies  Allergen Reactions  . Lisinopril Cough  . Percocet [Oxycodone-Acetaminophen] Other (See Comments)    Hallucinations  . Valium [Diazepam] Other (See Comments)    Confusion  . Prednisone Anxiety and Other (See Comments)     Dizziness, weakness    Prior to Admission medications   Medication Sig Start Date End Date Taking? Authorizing Provider  acetaminophen (TYLENOL) 650 MG CR tablet Take 650 mg by mouth every 8 (eight) hours as needed for pain.   Yes [provider]  albuterol (PROVENTIL HFA;VENTOLIN HFA) 108 (90 Base) MCG/ACT inhaler Inhale 2 puffs into the lungs 4 (four) times daily as needed for wheezing or shortness of breath.   Yes [provider]  Calcium Carbonate-Vitamin D (CALCIUM-VITAMIN D3) 600-125 MG-UNIT TABS Take 1 capsule by mouth daily.   Yes [provider]  Cyanocobalamin (VITAMIN B-12) 5000 MCG TBDP Take 5,000 mcg by mouth daily. Take 2 tablets daily   Yes [provider]  ferrous sulfate 325 (65 FE) MG tablet Take 325 mg by mouth every other day.    Yes [provider]  fluticasone furoate-vilanterol (BREO ELLIPTA) 100-25 MCG/INH AEPB Place 1 puff into the nose daily. 03/17/18  Yes [provider]  furosemide (LASIX) 20 MG tablet Take 20 mg by mouth daily. Can take second tablet in the evening if weight increases more than 3 lbs 06/06/16  Yes [provider]  glipiZIDE (GLUCOTROL XL) 5 MG 24 hr tablet Take 5 mg by mouth 2 (two) times daily.  12/19/16  Yes [provider]  labetalol (NORMODYNE) 200 MG tablet Take 0.5 tablets (100 mg total) by mouth daily. Patient taking differently: Take 100 mg by mouth 2 (two) times daily. 12/06/20  Yes Richardo Priest, MD  metFORMIN (GLUCOPHAGE) 1000 MG tablet Take 1,000 mg by mouth 2 (two) times daily with  a meal.   Yes [provider]  rosuvastatin (CRESTOR) 40 MG tablet Take 40 mg by mouth daily. 06/19/20  Yes [provider]  sertraline (ZOLOFT) 100 MG tablet Take 100 mg by mouth daily.    Yes [provider]  tolterodine (DETROL LA) 4 MG 24 hr capsule Take 4 mg by mouth every morning. 05/03/20  Yes [provider]  traMADol (ULTRAM) 50 MG tablet Take 50 mg by mouth daily as needed for severe pain. 03/27/17  Yes [provider]  Vitamin D, Ergocalciferol, (DRISDOL) 1.25 MG (50000 UT) CAPS capsule Take 50,000 Units by mouth once a week. 06/02/16  Yes [provider]  XARELTO 20 MG TABS tablet TAKE ONE TABLET BY MOUTH AT BEDTIME Patient taking differently: Take 20 mg by mouth daily with supper. 01/16/21  Yes Richardo Priest, MD   No results  found.  Positive ROS: All other systems have been reviewed and were otherwise negative with the exception of those mentioned in the HPI and as above.  Physical Exam: General: Alert, no acute distress Cardiovascular: No pedal edema Respiratory: No cyanosis, no use of accessory musculature GI: No organomegaly, abdomen is soft and non-tender Skin: No lesions in the area of chief complaint Neurologic: Sensation intact distally Psychiatric: Patient is competent for consent with normal mood and affect Lymphatic: No axillary or cervical lymphadenopathy  MUSCULOSKELETAL: Right lower extremity laterally rotated with ecchymosis noted.  Tender to palpation of the right hip.  Pulse movement sensation intact at the right foot with good plantar and dorsiflexion  Assessment: Right [place] B2 Periprosthetic femur fracture  Plan: I discussed the findings with the patient.  We discussed her imaging results.  She has a periprosthetic fracture around the femoral component with subsidence.  Since she has a loose, unstable femoral component, she will require femoral component revision.  We discussed the risks, benefits, and  alternatives.  She understands that the biggest risk include infection and hip instability.  Plan for surgery on Thursday.Nothing by mouth after midnight on Wednesday night.   Discontinue heparin drip at 0100 on Thursday morning.  All questions solicited and answered.     Biannca Scantlin B Evanell Redlich, PA [phone]    01/22/2021 9:48 AM 

## 2021-01-23 ENCOUNTER — Inpatient Hospital Stay (HOSPITAL_COMMUNITY): Payer: Medicare PPO

## 2021-01-23 DIAGNOSIS — Z01810 Encounter for preprocedural cardiovascular examination: Secondary | ICD-10-CM

## 2021-01-23 LAB — ECHOCARDIOGRAM COMPLETE
AR max vel: 2.29 cm2
AV Area VTI: 2.44 cm2
AV Area mean vel: 2.34 cm2
AV Mean grad: 3.5 mmHg
AV Peak grad: 7.1 mmHg
Ao pk vel: 1.34 m/s
Area-P 1/2: 5.02 cm2
Calc EF: 59 %
Height: 67 in
MV M vel: 4.47 m/s
MV Peak grad: 79.9 mmHg
S' Lateral: 3.7 cm
Single Plane A2C EF: 53.3 %
Single Plane A4C EF: 62.8 %
Weight: 2903.02 oz

## 2021-01-23 LAB — CBC WITH DIFFERENTIAL/PLATELET
Abs Immature Granulocytes: 0.16 10*3/uL — ABNORMAL HIGH (ref 0.00–0.07)
Basophils Absolute: 0 10*3/uL (ref 0.0–0.1)
Basophils Relative: 0 %
Eosinophils Absolute: 0 10*3/uL (ref 0.0–0.5)
Eosinophils Relative: 0 %
HCT: 37.7 % (ref 36.0–46.0)
Hemoglobin: 12.4 g/dL (ref 12.0–15.0)
Immature Granulocytes: 1 %
Lymphocytes Relative: 12 %
Lymphs Abs: 1.4 10*3/uL (ref 0.7–4.0)
MCH: 29.2 pg (ref 26.0–34.0)
MCHC: 32.9 g/dL (ref 30.0–36.0)
MCV: 88.7 fL (ref 80.0–100.0)
Monocytes Absolute: 1.5 10*3/uL — ABNORMAL HIGH (ref 0.1–1.0)
Monocytes Relative: 13 %
Neutro Abs: 8.5 10*3/uL — ABNORMAL HIGH (ref 1.7–7.7)
Neutrophils Relative %: 74 %
Platelets: 146 10*3/uL — ABNORMAL LOW (ref 150–400)
RBC: 4.25 MIL/uL (ref 3.87–5.11)
RDW: 13.4 % (ref 11.5–15.5)
WBC: 11.6 10*3/uL — ABNORMAL HIGH (ref 4.0–10.5)
nRBC: 0 % (ref 0.0–0.2)

## 2021-01-23 LAB — HEPARIN LEVEL (UNFRACTIONATED)
Heparin Unfractionated: 0.15 IU/mL — ABNORMAL LOW (ref 0.30–0.70)
Heparin Unfractionated: 0.18 IU/mL — ABNORMAL LOW (ref 0.30–0.70)

## 2021-01-23 LAB — URINALYSIS, ROUTINE W REFLEX MICROSCOPIC
Bilirubin Urine: NEGATIVE
Glucose, UA: >=500 mg/dL — AB
Hgb urine dipstick: NEGATIVE
Ketones, ur: 5 mg/dL — AB
Leukocytes,Ua: NEGATIVE
Nitrite: NEGATIVE
Protein, ur: 30 mg/dL — AB
Specific Gravity, Urine: 1.021 (ref 1.005–1.030)
pH: 5 (ref 5.0–8.0)

## 2021-01-23 LAB — GLUCOSE, CAPILLARY
Glucose-Capillary: 179 mg/dL — ABNORMAL HIGH (ref 70–99)
Glucose-Capillary: 302 mg/dL — ABNORMAL HIGH (ref 70–99)
Glucose-Capillary: 259 mg/dL — ABNORMAL HIGH (ref 70–99)
Glucose-Capillary: 219 mg/dL — ABNORMAL HIGH (ref 70–99)

## 2021-01-23 LAB — SARS CORONAVIRUS 2 BY RT PCR (HOSPITAL ORDER, PERFORMED IN ~~LOC~~ HOSPITAL LAB): SARS Coronavirus 2: NEGATIVE

## 2021-01-23 MED ORDER — HEPARIN BOLUS VIA INFUSION
2000.0000 [IU] | Freq: Once | INTRAVENOUS | Status: AC
  Administered 2021-01-23: 2000 [IU] via INTRAVENOUS
  Filled 2021-01-23: qty 2000

## 2021-01-23 MED ORDER — HEPARIN BOLUS VIA INFUSION
2500.0000 [IU] | Freq: Once | INTRAVENOUS | Status: AC
  Administered 2021-01-23: 2500 [IU] via INTRAVENOUS
  Filled 2021-01-23: qty 2500

## 2021-01-23 MED ORDER — DILTIAZEM HCL 25 MG/5ML IV SOLN
10.0000 mg | Freq: Once | INTRAVENOUS | Status: AC
  Administered 2021-01-23: 10 mg via INTRAVENOUS
  Filled 2021-01-23: qty 5

## 2021-01-23 MED ORDER — DILTIAZEM HCL-DEXTROSE 125-5 MG/125ML-% IV SOLN (PREMIX)
5.0000 mg/h | INTRAVENOUS | Status: DC
  Administered 2021-01-23 – 2021-01-24 (×2): 5 mg/h via INTRAVENOUS
  Filled 2021-01-23 (×2): qty 125

## 2021-01-23 MED ORDER — INSULIN GLARGINE 100 UNIT/ML ~~LOC~~ SOLN
20.0000 [IU] | Freq: Every day | SUBCUTANEOUS | Status: DC
  Administered 2021-01-24 – 2021-01-29 (×6): 20 [IU] via SUBCUTANEOUS
  Filled 2021-01-23 (×6): qty 0.2

## 2021-01-23 MED ORDER — SODIUM CHLORIDE 0.9 % IV SOLN: INTRAVENOUS | Status: DC

## 2021-01-23 MED ORDER — SODIUM CHLORIDE 0.9 % IV SOLN
1.0000 g | INTRAVENOUS | Status: AC
  Administered 2021-01-23 – 2021-01-25 (×3): 1 g via INTRAVENOUS
  Filled 2021-01-23 (×3): qty 1

## 2021-01-23 NOTE — Progress Notes (Signed)
PROGRESS NOTE    Lori Rodriguez  ZOX:[id number] DOB: Jun 02, 1935 DOA: 01/21/2021 PCP: Penelope Coop, FNP   Chief Complain: Transfer from [facility]  Brief Narrative: Patient is a 86 year old female with history of paroxysmal A. fib on anticoagulation with Xarelto, CKD status 3A, diabetes type 2, hypertension, hyperlipidemia, history of breast cancer, diastolic dysfunction who was sent from [facility] for the management of right nondisplaced fracture of her prosthetic hip as per the report she had a mechanical fall when she slipped over a rug.  Orthopedics planning for ORIF on May 5.  She went into A. fib with RVR after admission here,now rate is controlled.Plan for ORIF tomorrow  Assessment & Plan:   Principal Problem:   Femur fracture ([place]) Active Problems:   Mixed hyperlipidemia   Long term (current) use of anticoagulants   Atrial fibrillation (HCC)   Chronic kidney disease   Diabetes mellitus without complication ([place])   Right femoral periprosthetic fracture: Transferred here from [facility] for orthopedic management.  Continue pain management, supportive care.  Plan for ORIF tomorrow. PT/OT will consult after surgery.  A. fib with RVR: Takes labetalol for rate control, on Xarelto for anticoagulation.  She was on A. fib with RVR after admission here.  Started heparin drip for anticoagulation.  Xarelto can be started after surgery. Heart rate well controlled after restarting labetalol.  Diabetes type 2: Monitor blood sugars.  Continue sliding scale insulin and Lantus.  Recent hemoglobin A1c of 7.1.  Hyperlipidemia: Continue statin  CKD stage IIIa: Currently kidney function at baseline  Coronary artery disease: Denies any chest pain.  Currently stable.  Echocardiogram has been ordered.Follows with Dr Bettina Gavia  History of breast cancer: We recommend oncology follow-up as an outpatient  Hypertension: Currently blood pressure stable.  Continue as  needed medications for severe hypertension.  Leukocytosis/intermittent fever: Mild leukocytosis.  She has spiked mild grade fever twice since yesterday, we will check UA, blood cultures.  History of depression: On Zoloft         DVT prophylaxis:Heparin IV Code Status: Full Family Communication: Called daughter on phone on 01/22/21 Status is: Inpatient  Remains inpatient appropriate because:Inpatient level of care appropriate due to severity of illness   Dispo: The patient is from: Home              Anticipated d/c is to: SNF              Patient currently is not medically stable to d/c.   Difficult to place patient No    Consultants: Ortho  Procedures:None  Antimicrobials:  Anti-infectives (From admission, onward)   None      Subjective:  Patient seen and examined the bedside this morning.  Hemodynamically stable.  She looks little weak today.  She had fever of 100.4 F yesterday around 4:00 pm.  Complains of some dysuria.  Checking UA  Objective: Vitals:   01/22/21 1245 01/22/21 1635 01/22/21 2053 01/23/21 0453  BP: 122/71 (!) 148/70 131/62 (!) 152/73  Pulse: 86 96 87 95  Resp: 20 20 18 18   Temp: 97.9 F (36.6 C) (!) 100.4 F (38 C) 99.5 F (37.5 C) 99.7 F (37.6 C)  TempSrc: Oral Oral Oral Oral  SpO2: 96% 97% 94% 93%  Weight:      Height:        Intake/Output Summary (Last 24 hours) at 01/23/2021 0822 Last data filed at 01/23/2021 0453 Gross per 24 hour  Intake 634.85 ml  Output 800 ml  Net -165.15 ml   Filed Weights   01/21/21 2000 01/21/21 2235  Weight: 81.2 kg 82.3 kg    Examination:  General exam: Overall comfortable, not in distress, pleasant elderly female HEENT: PERRL Respiratory system:  no wheezes or crackles  Cardiovascular system: S1 & S2 heard, RRR.  Gastrointestinal system: Abdomen is nondistended, soft and nontender. Central nervous system: Alert and oriented Extremities: No edema, no clubbing ,no cyanosis, tenderness on the right  hip Skin: No rashes, no ulcers,no icterus      Data Reviewed: I have personally reviewed following labs and imaging studies  CBC: Recent Labs  Lab 01/21/21 2058 01/22/21 0328 01/23/21 0421  WBC 10.4 12.0* 11.6*  NEUTROABS  --   --  8.5*  HGB 12.7 12.7 12.4  HCT 38.9 39.4 37.7  MCV 90.3 89.5 88.7  PLT 157 164 664*   Basic Metabolic Panel: Recent Labs  Lab 01/21/21 2058 01/22/21 0328  NA  --  135  K  --  4.0  CL  --  101  CO2  --  24  GLUCOSE  --  178*  BUN  --  17  CREATININE 0.74 0.62  CALCIUM  --  9.1   GFR: Estimated Creatinine Clearance: 58.8 mL/min (by C-G formula based on SCr of 0.62 mg/dL). Liver Function Tests: Recent Labs  Lab 01/22/21 0328  AST 16  ALT 13  ALKPHOS 30*  BILITOT 0.8  PROT 6.9  ALBUMIN 3.8   No results for input(s): LIPASE, AMYLASE in the last 168 hours. No results for input(s): AMMONIA in the last 168 hours. Coagulation Profile: No results for input(s): INR, PROTIME in the last 168 hours. Cardiac Enzymes: No results for input(s): CKTOTAL, CKMB, CKMBINDEX, TROPONINI in the last 168 hours. BNP (last 3 results) Recent Labs    05/25/20 1521  PROBNP 587   HbA1C: Recent Labs    01/21/21 2058  HGBA1C 7.1*   CBG: Recent Labs  Lab 01/22/21 0738 01/22/21 1115 01/22/21 1632 01/22/21 2051 01/23/21 0808  GLUCAP 191* 326* 186* 264* 179*   Lipid Profile: No results for input(s): CHOL, HDL, LDLCALC, TRIG, CHOLHDL, LDLDIRECT in the last 72 hours. Thyroid Function Tests: No results for input(s): TSH, T4TOTAL, FREET4, T3FREE, THYROIDAB in the last 72 hours. Anemia Panel: No results for input(s): VITAMINB12, FOLATE, FERRITIN, TIBC, IRON, RETICCTPCT in the last 72 hours. Sepsis Labs: No results for input(s): PROCALCITON, LATICACIDVEN in the last 168 hours.  Recent Results (from the past 240 hour(s))  Surgical PCR screen     Status: None   Collection Time: 01/21/21 10:54 PM   Specimen: Nasal Mucosa; Nasal Swab  Result Value  Ref Range Status   MRSA, PCR NEGATIVE NEGATIVE Final   Staphylococcus aureus NEGATIVE NEGATIVE Final    Comment: (NOTE) The Xpert SA Assay (FDA approved for NASAL specimens in patients 23 years of age and older), is one component of a comprehensive surveillance program. It is not intended to diagnose infection nor to guide or monitor treatment. Performed at [facility], [place] [address], [place], [place] [address]          Radiology Studies: DG FEMUR PORT, MIN 2 VIEWS RIGHT  Result Date: 01/22/2021 CLINICAL DATA:  Fall. EXAM: RIGHT FEMUR PORTABLE 2 VIEW COMPARISON:  01/19/2021. FINDINGS: ORIF right hip. Known right subtrochanteric hip fracture stable. No new bony abnormality is identified. Distal femur is intact. Degenerative changes right knee. Tiny joint effusion. Peripheral vascular calcification. IMPRESSION: ORIF right hip. Known right subtrochanteric hip  fractures stable. No new bony abnormality identified. 2.  Degenerative changes right knee. 3.  Tiny knee joint effusion. 4.  Peripheral vascular disease. Electronically Signed   By: Marcello Moores  Register   On: 01/22/2021 13:59        Scheduled Meds: . insulin aspart  0-15 Units Subcutaneous TID WC  . insulin aspart  0-5 Units Subcutaneous QHS  . insulin glargine  15 Units Subcutaneous Daily  . labetalol  100 mg Oral BID  . mupirocin ointment  1 application Nasal BID  . sertraline  100 mg Oral Daily   Continuous Infusions: . heparin 1,350 Units/hr (01/23/21 0510)     LOS: 2 days    Time spent: 35 mins.More than 50% of that time was spent in counseling and/or coordination of care.      Shelly Coss, MD [facility] P5/12/2020, 8:22 AM

## 2021-01-23 NOTE — Plan of Care (Signed)

## 2021-01-23 NOTE — Progress Notes (Signed)
[place] for Heparin Indication: bridging from Xarelto for AFib for Hip fx repair  Allergies  Allergen Reactions  . Lisinopril Cough  . Percocet [Oxycodone-Acetaminophen] Other (See Comments)    Hallucinations  . Valium [Diazepam] Other (See Comments)    Confusion  . Prednisone Anxiety and Other (See Comments)     Dizziness, weakness    Patient Measurements: Height: 5\' 7"  (170.2 cm) Weight: 82.3 kg (181 lb 7 oz) IBW/kg (Calculated) : 61.6 Heparin Dosing Weight: 78.6 kg  Vital Signs: Temp: 99.7 F (37.6 C) (05/04 0453) Temp Source: Oral (05/04 0453) BP: 152/73 (05/04 0453) Pulse Rate: 95 (05/04 0453)  Labs: Recent Labs    01/21/21 2058 01/22/21 0328 01/22/21 1100 01/22/21 2103 01/23/21 0421  HGB 12.7 12.7  --   --  12.4  HCT 38.9 39.4  --   --  37.7  PLT 157 164  --   --  146*  APTT  --   --  31  --   --   HEPARINUNFRC  --   --  0.13* 0.12* 0.15*  CREATININE 0.74 0.62  --   --   --    Estimated Creatinine Clearance: 58.8 mL/min (by C-G formula based on SCr of 0.62 mg/dL).  Medical History: Past Medical History:  Diagnosis Date  . Anemia   . Anginal pain ([place])   . Atrial fibrillation ([place])   . Cancer [facility])    Left Breast Cancer  . Cancer of central portion of left female breast ([place]) 03/10/2016  . CHF (congestive heart failure) ([place])   . Chronic back pain   . Chronic kidney disease    Right Renal Mass  . Diabetes mellitus without complication ([place])   . DJD (degenerative joint disease)   . Dysrhythmia    Atrial fibrillation  . Encounter for preprocedural cardiovascular examination 02/28/2016   Formatting of this note might be different from the original. Overview:  Remote normal coronary arteriography and echo 2015 with normal EF% Formatting of this note might be different from the original. Remote normal coronary arteriography and echo 2015 with normal EF%  . Estrogen receptor positive neoplasm 03/10/2016  .  Hypertension   . Hypertensive heart disease 02/28/2016  . Long term (current) use of anticoagulants 02/29/2016   Overview:  Overview:  apixaban Overview:  apixaban  Formatting of this note might be different from the original. Overview:  apixaban Formatting of this note might be different from the original. apixaban  . Mixed hyperlipidemia 02/29/2016  . Orthostatic hypotension 12/01/2018  . Osteoporosis   . Persistent atrial fibrillation ([place]) 02/28/2016   Overview:  Overview:  CHADS2 vasc score= 4  Overview:  CHADS2 vasc score= 4 Overview:  CHADS2 vasc score= 4  Formatting of this note might be different from the original. Overview:  CHADS2 vasc score= 4  Overview:  CHADS2 vasc score= 4 Formatting of this note might be different from the original. CHADS2 vasc score= 4  . Right renal mass   . Secondary pulmonary arterial hypertension ([place]) 11/03/2018  . Type 2 diabetes mellitus ([place]) 02/29/2016  . Vitamin B12 deficiency   . Vitamin D deficiency     Medications:  Scheduled:  . insulin aspart  0-15 Units Subcutaneous TID WC  . insulin aspart  0-5 Units Subcutaneous QHS  . [START ON 01/24/2021] insulin glargine  20 Units Subcutaneous Daily  . labetalol  100 mg Oral BID  . mupirocin ointment  1 application Nasal BID  .  sertraline  100 mg Oral Daily   Infusions:  . heparin 1,350 Units/hr (01/23/21 1042)    Assessment: 81 yoF [facility] with [facility] hip Fx. Transfer to [facility] on 5/2, plan Hip fx repair 5/5.  Hx Afib on Xarelto 20mg  qd, per notes was held at [facility]. Was noted in Afib with RVR on arrival to [facility]  Heparin infusion for bridging around surgery, ordered baseline aPTT and Hep level - both low Baseline aPTT & Hep level low as expected  Today, 01/23/2021 PM:  0421 HL 0.15 subtherapeutic on 1100 units/hr, rebolus 2000 units, increased rate to 1350 units/hr  Hgb 12.4, Plts 146, No bleeding issues or interruptions per RN  Follow up HL at 1254 = 0.18, still subtherapeutic  despite rebolusing and rate increases  Verified correct Heparin rate this am  Goal of Therapy:  Heparin level 0.3-0.7 units/ml Monitor platelets by anticoagulation protocol: Yes   Plan:   Bolus heparin 2500 units x 1  Increase heparin drip to 1550 units/hr  Heparin level in 8 hours  Daily CBC ordered, begin daily Heparin level at steady state  Ortho MD noted hold Heparin at 0100 prior to surgery 5/5   Minda Ditto PharmD 01/23/2021, 1:37 PM

## 2021-01-23 NOTE — Progress Notes (Signed)
[place] for Heparin Indication: bridging from Xarelto for AFib for Hip fx repair  Allergies  Allergen Reactions  . Lisinopril Cough  . Percocet [Oxycodone-Acetaminophen] Other (See Comments)    Hallucinations  . Valium [Diazepam] Other (See Comments)    Confusion  . Prednisone Anxiety and Other (See Comments)     Dizziness, weakness    Patient Measurements: Height: 5\' 7"  (170.2 cm) Weight: 82.3 kg (181 lb 7 oz) IBW/kg (Calculated) : 61.6 Heparin Dosing Weight: 78.6 kg  Vital Signs: Temp: 99.5 F (37.5 C) (05/03 2053) Temp Source: Oral (05/03 2053) BP: 131/62 (05/03 2053) Pulse Rate: 87 (05/03 2053)  Labs: Recent Labs    01/21/21 2058 01/22/21 0328 01/22/21 1100 01/22/21 2103 01/23/21 0421  HGB 12.7 12.7  --   --  12.4  HCT 38.9 39.4  --   --  37.7  PLT 157 164  --   --  146*  APTT  --   --  31  --   --   HEPARINUNFRC  --   --  0.13* 0.12* 0.15*  CREATININE 0.74 0.62  --   --   --    Estimated Creatinine Clearance: 58.8 mL/min (by C-G formula based on SCr of 0.62 mg/dL).  Medical History: Past Medical History:  Diagnosis Date  . Anemia   . Anginal pain ([place])   . Atrial fibrillation ([place])   . Cancer [facility])    Left Breast Cancer  . Cancer of central portion of left female breast ([place]) 03/10/2016  . CHF (congestive heart failure) ([place])   . Chronic back pain   . Chronic kidney disease    Right Renal Mass  . Diabetes mellitus without complication ([place])   . DJD (degenerative joint disease)   . Dysrhythmia    Atrial fibrillation  . Encounter for preprocedural cardiovascular examination 02/28/2016   Formatting of this note might be different from the original. Overview:  Remote normal coronary arteriography and echo 2015 with normal EF% Formatting of this note might be different from the original. Remote normal coronary arteriography and echo 2015 with normal EF%  . Estrogen receptor positive neoplasm 03/10/2016  .  Hypertension   . Hypertensive heart disease 02/28/2016  . Long term (current) use of anticoagulants 02/29/2016   Overview:  Overview:  apixaban Overview:  apixaban  Formatting of this note might be different from the original. Overview:  apixaban Formatting of this note might be different from the original. apixaban  . Mixed hyperlipidemia 02/29/2016  . Orthostatic hypotension 12/01/2018  . Osteoporosis   . Persistent atrial fibrillation ([place]) 02/28/2016   Overview:  Overview:  CHADS2 vasc score= 4  Overview:  CHADS2 vasc score= 4 Overview:  CHADS2 vasc score= 4  Formatting of this note might be different from the original. Overview:  CHADS2 vasc score= 4  Overview:  CHADS2 vasc score= 4 Formatting of this note might be different from the original. CHADS2 vasc score= 4  . Right renal mass   . Secondary pulmonary arterial hypertension ([place]) 11/03/2018  . Type 2 diabetes mellitus ([place]) 02/29/2016  . Vitamin B12 deficiency   . Vitamin D deficiency     Medications:  Scheduled:  . insulin aspart  0-15 Units Subcutaneous TID WC  . insulin aspart  0-5 Units Subcutaneous QHS  . insulin glargine  15 Units Subcutaneous Daily  . labetalol  100 mg Oral BID  . mupirocin ointment  1 application Nasal BID  . sertraline  100  mg Oral Daily   Infusions:  . heparin 1,100 Units/hr (01/22/21 1246)    Assessment: 27 yoF presented to [facility] with prosthetic hip Fx. Transfer to [facility] on 5/2, plan Hip fx repair 5/5.  Hx Afib on Xarelto 20mg  qd, per notes was held at [facility]. Was noted in Afib with RVR on arrival to [facility]  Heparin infusion for bridging around surgery, ordered baseline aPTT and Hep level - both low Baseline aPTT & Hep level low as expected  Today, 01/23/2021 PM:  HL 0.15 subtherapeutic on 1100 units/hr  Hgb 12.4, Plts 146  No bleeding issues or interruptions per RN  Goal of Therapy:  Heparin level 0.3-0.7 units/ml Monitor platelets by anticoagulation protocol: Yes   Plan:   Bolus heparin  2000 units x 1  Increase heparin drip to 1350 units/hr  Heparin level in 8 hours  Daily CBC ordered, begin daily Heparin level at steady state  Ortho MD to determine time to hold Heparin prior to surgery 5/5  Dolly Rias RPh 01/23/2021, 4:51 AM

## 2021-01-24 ENCOUNTER — Inpatient Hospital Stay (HOSPITAL_COMMUNITY): Payer: Medicare PPO

## 2021-01-24 ENCOUNTER — Inpatient Hospital Stay (HOSPITAL_COMMUNITY): Payer: Medicare PPO | Admitting: Anesthesiology

## 2021-01-24 ENCOUNTER — Encounter (HOSPITAL_COMMUNITY)
Admission: AD | Disposition: A | Discharge status: Skilled Nursing Facility | Payer: Self-pay | Source: Other Acute Inpatient Hospital | Attending: Internal Medicine

## 2021-01-24 HISTORY — PX: TOTAL HIP REVISION: SHX763

## 2021-01-24 LAB — GLUCOSE, CAPILLARY
Glucose-Capillary: 198 mg/dL — ABNORMAL HIGH (ref 70–99)
Glucose-Capillary: 189 mg/dL — ABNORMAL HIGH (ref 70–99)
Glucose-Capillary: 198 mg/dL — ABNORMAL HIGH (ref 70–99)
Glucose-Capillary: 228 mg/dL — ABNORMAL HIGH (ref 70–99)

## 2021-01-24 LAB — HEPARIN LEVEL (UNFRACTIONATED): Heparin Unfractionated: 0.15 IU/mL — ABNORMAL LOW (ref 0.30–0.70)

## 2021-01-24 LAB — BASIC METABOLIC PANEL
Anion gap: 11 (ref 5–15)
BUN: 23 mg/dL (ref 8–23)
CO2: 22 mmol/L (ref 22–32)
Calcium: 8.7 mg/dL — ABNORMAL LOW (ref 8.9–10.3)
Chloride: 100 mmol/L (ref 98–111)
Creatinine, Ser: 0.72 mg/dL (ref 0.44–1.00)
GFR, Estimated: >60 mL/min (ref >60)
Glucose, Bld: 198 mg/dL — ABNORMAL HIGH (ref 70–99)
Potassium: 4 mmol/L (ref 3.5–5.1)
Sodium: 133 mmol/L — ABNORMAL LOW (ref 135–145)

## 2021-01-24 LAB — CBC
HCT: 34.7 % — ABNORMAL LOW (ref 36.0–46.0)
Hemoglobin: 11.1 g/dL — ABNORMAL LOW (ref 12.0–15.0)
MCH: 29.1 pg (ref 26.0–34.0)
MCHC: 32 g/dL (ref 30.0–36.0)
MCV: 91.1 fL (ref 80.0–100.0)
Platelets: 149 10*3/uL — ABNORMAL LOW (ref 150–400)
RBC: 3.81 MIL/uL — ABNORMAL LOW (ref 3.87–5.11)
RDW: 13.2 % (ref 11.5–15.5)
WBC: 12.5 10*3/uL — ABNORMAL HIGH (ref 4.0–10.5)
nRBC: 0 % (ref 0.0–0.2)

## 2021-01-24 LAB — ABO/RH: ABO/RH(D): A POS

## 2021-01-24 LAB — TYPE AND SCREEN
ABO/RH(D): A POS
Antibody Screen: NEGATIVE

## 2021-01-24 SURGERY — TOTAL HIP REVISION
Anesthesia: Monitor Anesthesia Care | Site: Hip | Laterality: Right

## 2021-01-24 MED ORDER — FENTANYL CITRATE (PF) 100 MCG/2ML IJ SOLN
INTRAMUSCULAR | Status: AC
  Filled 2021-01-24: qty 2

## 2021-01-24 MED ORDER — CEFAZOLIN SODIUM-DEXTROSE 2-4 GM/100ML-% IV SOLN
2.0000 g | INTRAVENOUS | Status: AC
  Administered 2021-01-24: 2 g via INTRAVENOUS
  Filled 2021-01-24: qty 100

## 2021-01-24 MED ORDER — PROPOFOL 500 MG/50ML IV EMUL
INTRAVENOUS | Status: DC | PRN
  Administered 2021-01-24: 35 ug/kg/min via INTRAVENOUS

## 2021-01-24 MED ORDER — SENNA 8.6 MG PO TABS
1.0000 | ORAL_TABLET | Freq: Two times a day (BID) | ORAL | Status: DC
  Administered 2021-01-24 – 2021-01-29 (×10): 8.6 mg via ORAL
  Filled 2021-01-24 (×10): qty 1

## 2021-01-24 MED ORDER — RIVAROXABAN 10 MG PO TABS
10.0000 mg | ORAL_TABLET | Freq: Every day | ORAL | Status: AC
  Administered 2021-01-25 – 2021-01-27 (×3): 10 mg via ORAL
  Filled 2021-01-24 (×3): qty 1

## 2021-01-24 MED ORDER — DOCUSATE SODIUM 100 MG PO CAPS
100.0000 mg | ORAL_CAPSULE | Freq: Two times a day (BID) | ORAL | Status: DC
  Administered 2021-01-24 – 2021-01-29 (×10): 100 mg via ORAL
  Filled 2021-01-24 (×10): qty 1

## 2021-01-24 MED ORDER — LACTATED RINGERS IV SOLN: INTRAVENOUS | Status: DC

## 2021-01-24 MED ORDER — DEXMEDETOMIDINE (PRECEDEX) IN NS 20 MCG/5ML (4 MCG/ML) IV SYRINGE
PREFILLED_SYRINGE | INTRAVENOUS | Status: DC | PRN
  Administered 2021-01-24 (×2): 4 ug via INTRAVENOUS

## 2021-01-24 MED ORDER — SODIUM CHLORIDE (PF) 0.9 % IJ SOLN
INTRAMUSCULAR | Status: DC | PRN
  Administered 2021-01-24: 50 mL

## 2021-01-24 MED ORDER — EPHEDRINE 5 MG/ML INJ
INTRAVENOUS | Status: AC
  Filled 2021-01-24: qty 10

## 2021-01-24 MED ORDER — DEXMEDETOMIDINE (PRECEDEX) IN NS 20 MCG/5ML (4 MCG/ML) IV SYRINGE
PREFILLED_SYRINGE | INTRAVENOUS | Status: AC
  Filled 2021-01-24: qty 5

## 2021-01-24 MED ORDER — TRANEXAMIC ACID-NACL 1000-0.7 MG/100ML-% IV SOLN
1000.0000 mg | INTRAVENOUS | Status: AC
Start: 2021-01-24 — End: 2021-01-24
  Administered 2021-01-24: 1000 mg via INTRAVENOUS
  Filled 2021-01-24: qty 100

## 2021-01-24 MED ORDER — PROPOFOL 1000 MG/100ML IV EMUL
INTRAVENOUS | Status: AC
  Filled 2021-01-24: qty 100

## 2021-01-24 MED ORDER — POVIDONE-IODINE 10 % EX SWAB: 2.0000 "application " | Freq: Once | CUTANEOUS | Status: DC

## 2021-01-24 MED ORDER — BUPIVACAINE IN DEXTROSE 0.75-8.25 % IT SOLN
INTRATHECAL | Status: DC | PRN
  Administered 2021-01-24: 2 mL via INTRATHECAL

## 2021-01-24 MED ORDER — FENTANYL CITRATE (PF) 100 MCG/2ML IJ SOLN
INTRAMUSCULAR | Status: DC | PRN
  Administered 2021-01-24 (×4): 25 ug via INTRAVENOUS

## 2021-01-24 MED ORDER — PHENYLEPHRINE HCL-NACL 20-0.9 MG/250ML-% IV SOLN
INTRAVENOUS | Status: DC | PRN
  Administered 2021-01-24: 20 ug/min via INTRAVENOUS

## 2021-01-24 MED ORDER — ONDANSETRON HCL 4 MG/2ML IJ SOLN: 4.0000 mg | Freq: Once | INTRAMUSCULAR | Status: DC | PRN

## 2021-01-24 MED ORDER — ONDANSETRON HCL 4 MG/2ML IJ SOLN
INTRAMUSCULAR | Status: DC | PRN
  Administered 2021-01-24: 4 mg via INTRAVENOUS

## 2021-01-24 MED ORDER — HYDROCODONE-ACETAMINOPHEN 5-325 MG PO TABS
ORAL_TABLET | ORAL | Status: AC
  Filled 2021-01-24: qty 1

## 2021-01-24 MED ORDER — RIVAROXABAN 20 MG PO TABS
20.0000 mg | ORAL_TABLET | Freq: Every day | ORAL | Status: DC
  Administered 2021-01-28 – 2021-01-29 (×2): 20 mg via ORAL
  Filled 2021-01-24 (×2): qty 1

## 2021-01-24 MED ORDER — PHENOL 1.4 % MT LIQD: 1.0000 | OROMUCOSAL | Status: DC | PRN

## 2021-01-24 MED ORDER — METOCLOPRAMIDE HCL 5 MG/ML IJ SOLN: 5.0000 mg | Freq: Three times a day (TID) | INTRAMUSCULAR | Status: DC | PRN

## 2021-01-24 MED ORDER — ONDANSETRON HCL 4 MG PO TABS: 4.0000 mg | ORAL_TABLET | Freq: Four times a day (QID) | ORAL | Status: DC | PRN

## 2021-01-24 MED ORDER — HYDROCODONE-ACETAMINOPHEN 7.5-325 MG PO TABS
1.0000 | ORAL_TABLET | ORAL | Status: DC | PRN
  Administered 2021-01-24 – 2021-01-29 (×9): 1 via ORAL
  Filled 2021-01-24 (×9): qty 1

## 2021-01-24 MED ORDER — TRANEXAMIC ACID-NACL 1000-0.7 MG/100ML-% IV SOLN
1000.0000 mg | Freq: Once | INTRAVENOUS | Status: AC
  Administered 2021-01-24: 1000 mg via INTRAVENOUS
  Filled 2021-01-24: qty 100

## 2021-01-24 MED ORDER — STERILE WATER FOR IRRIGATION IR SOLN
Status: DC | PRN
  Administered 2021-01-24: 2000 mL

## 2021-01-24 MED ORDER — PROPOFOL 10 MG/ML IV BOLUS
INTRAVENOUS | Status: AC
  Filled 2021-01-24: qty 20

## 2021-01-24 MED ORDER — FENTANYL CITRATE (PF) 100 MCG/2ML IJ SOLN
25.0000 ug | INTRAMUSCULAR | Status: DC | PRN
  Administered 2021-01-24 (×3): 25 ug via INTRAVENOUS

## 2021-01-24 MED ORDER — ONDANSETRON HCL 4 MG/2ML IJ SOLN
INTRAMUSCULAR | Status: AC
  Filled 2021-01-24: qty 2

## 2021-01-24 MED ORDER — PHENYLEPHRINE HCL (PRESSORS) 10 MG/ML IV SOLN
INTRAVENOUS | Status: AC
  Filled 2021-01-24: qty 2

## 2021-01-24 MED ORDER — HYDROCODONE-ACETAMINOPHEN 5-325 MG PO TABS
1.0000 | ORAL_TABLET | ORAL | Status: DC | PRN
  Administered 2021-01-24 – 2021-01-29 (×3): 1 via ORAL
  Filled 2021-01-24 (×2): qty 1

## 2021-01-24 MED ORDER — CEFAZOLIN SODIUM-DEXTROSE 2-4 GM/100ML-% IV SOLN
2.0000 g | Freq: Four times a day (QID) | INTRAVENOUS | Status: AC
  Administered 2021-01-24 – 2021-01-25 (×2): 2 g via INTRAVENOUS
  Filled 2021-01-24 (×2): qty 100

## 2021-01-24 MED ORDER — METOCLOPRAMIDE HCL 5 MG PO TABS: 5.0000 mg | ORAL_TABLET | Freq: Three times a day (TID) | ORAL | Status: DC | PRN

## 2021-01-24 MED ORDER — PROPOFOL 10 MG/ML IV BOLUS
INTRAVENOUS | Status: DC | PRN
  Administered 2021-01-24 (×2): 10 mg via INTRAVENOUS

## 2021-01-24 MED ORDER — ONDANSETRON HCL 4 MG/2ML IJ SOLN
4.0000 mg | Freq: Four times a day (QID) | INTRAMUSCULAR | Status: DC | PRN
  Administered 2021-01-24 – 2021-01-25 (×2): 4 mg via INTRAVENOUS
  Filled 2021-01-24 (×2): qty 2

## 2021-01-24 MED ORDER — EPHEDRINE SULFATE-NACL 50-0.9 MG/10ML-% IV SOSY
PREFILLED_SYRINGE | INTRAVENOUS | Status: DC | PRN
  Administered 2021-01-24 (×4): 5 mg via INTRAVENOUS

## 2021-01-24 MED ORDER — CHLORHEXIDINE GLUCONATE CLOTH 2 % EX PADS
6.0000 | MEDICATED_PAD | Freq: Every day | CUTANEOUS | Status: DC
  Administered 2021-01-24 – 2021-01-26 (×3): 6 via TOPICAL

## 2021-01-24 MED ORDER — RIVAROXABAN 10 MG PO TABS: 10.0000 mg | ORAL_TABLET | Freq: Every day | ORAL | Status: DC

## 2021-01-24 MED ORDER — MORPHINE SULFATE (PF) 4 MG/ML IV SOLN
0.5000 mg | INTRAVENOUS | Status: DC | PRN
  Administered 2021-01-24 – 2021-01-25 (×3): 1 mg via INTRAVENOUS
  Filled 2021-01-24 (×4): qty 1

## 2021-01-24 MED ORDER — 0.9 % SODIUM CHLORIDE (POUR BTL) OPTIME
TOPICAL | Status: DC | PRN
  Administered 2021-01-24: 1000 mL

## 2021-01-24 MED ORDER — CHLORHEXIDINE GLUCONATE 4 % EX LIQD: 60.0000 mL | Freq: Once | CUTANEOUS | Status: DC

## 2021-01-24 MED ORDER — ACETAMINOPHEN 325 MG PO TABS
325.0000 mg | ORAL_TABLET | Freq: Four times a day (QID) | ORAL | Status: DC | PRN
  Filled 2021-01-24: qty 2

## 2021-01-24 MED ORDER — SODIUM CHLORIDE 0.9 % IR SOLN
Status: DC | PRN
  Administered 2021-01-24: 3000 mL

## 2021-01-24 MED ORDER — MENTHOL 3 MG MT LOZG: 1.0000 | LOZENGE | OROMUCOSAL | Status: DC | PRN

## 2021-01-24 SURGICAL SUPPLY — 87 items
BAG DECANTER FOR FLEXI CONT (MISCELLANEOUS) ×2 IMPLANT
BAG ZIPLOCK 12X15 (MISCELLANEOUS) ×2 IMPLANT
BLADE SAGITTAL 25.0X1.37X90 (BLADE) IMPLANT
BLADE SAW SGTL 18X1.27X75 (BLADE) IMPLANT
BNDG COHESIVE 4X5 TAN STRL (GAUZE/BANDAGES/DRESSINGS) ×1 IMPLANT
BNDG COHESIVE 6X5 TAN STRL LF (GAUZE/BANDAGES/DRESSINGS) ×2 IMPLANT
BRUSH FEMORAL CANAL (MISCELLANEOUS) IMPLANT
CANISTER WOUND CARE 500ML ATS (WOUND CARE) ×1 IMPLANT
CHLORAPREP W/TINT 26 (MISCELLANEOUS) ×2 IMPLANT
COVER SURGICAL LIGHT HANDLE (MISCELLANEOUS) ×2 IMPLANT
COVER WAND RF STERILE (DRAPES) IMPLANT
DERMABOND ADVANCED (GAUZE/BANDAGES/DRESSINGS) ×1
DERMABOND ADVANCED .7 DNX12 (GAUZE/BANDAGES/DRESSINGS) ×1 IMPLANT
DRAPE C-ARM 42X120 X-RAY (DRAPES) ×1 IMPLANT
DRAPE C-ARMOR (DRAPES) ×1 IMPLANT
DRAPE HIP W/POCKET STRL (MISCELLANEOUS) ×2 IMPLANT
DRAPE IMP U-DRAPE 54X76 (DRAPES) ×4 IMPLANT
DRAPE INCISE IOBAN 66X45 STRL (DRAPES) ×4 IMPLANT
DRAPE INCISE IOBAN 85X60 (DRAPES) ×1 IMPLANT
DRAPE ORTHO SPLIT 77X108 STRL (DRAPES)
DRAPE POUCH INSTRU U-SHP 10X18 (DRAPES) ×2 IMPLANT
DRAPE SHEET LG 3/4 BI-LAMINATE (DRAPES) ×6 IMPLANT
DRAPE STERI IOBAN 125X83 (DRAPES) ×2 IMPLANT
DRAPE SURG 17X11 SM STRL (DRAPES) ×2 IMPLANT
DRAPE SURG ORHT 6 SPLT 77X108 (DRAPES) IMPLANT
DRAPE U-SHAPE 47X51 STRL (DRAPES) ×4 IMPLANT
DRESSING PREVENA PLUS CUSTOM (GAUZE/BANDAGES/DRESSINGS) IMPLANT
DRSG AQUACEL AG ADV 3.5X10 (GAUZE/BANDAGES/DRESSINGS) ×2 IMPLANT
DRSG AQUACEL AG ADV 3.5X14 (GAUZE/BANDAGES/DRESSINGS) ×2 IMPLANT
DRSG PREVENA PLUS CUSTOM (GAUZE/BANDAGES/DRESSINGS) ×2
ELECT BLADE TIP CTD 4 INCH (ELECTRODE) ×2 IMPLANT
ELECT REM PT RETURN 15FT ADLT (MISCELLANEOUS) ×2 IMPLANT
GAUZE SPONGE 2X2 8PLY STRL LF (GAUZE/BANDAGES/DRESSINGS) IMPLANT
GLOVE SRG 8 PF TXTR STRL LF DI (GLOVE) ×1 IMPLANT
GLOVE SURG ENC MOIS LTX SZ8.5 (GLOVE) ×4 IMPLANT
GLOVE SURG ENC TEXT LTX SZ7.5 (GLOVE) ×4 IMPLANT
GLOVE SURG UNDER POLY LF SZ8 (GLOVE) ×1
GLOVE SURG UNDER POLY LF SZ8.5 (GLOVE) ×2 IMPLANT
GOWN SPEC L3 XXLG W/TWL (GOWN DISPOSABLE) ×2 IMPLANT
GOWN STRL REUS W/ TWL LRG LVL3 (GOWN DISPOSABLE) ×1 IMPLANT
GOWN STRL REUS W/TWL LRG LVL3 (GOWN DISPOSABLE) ×1
HANDPIECE INTERPULSE COAX TIP (DISPOSABLE) ×1
HEAD FEMORAL HIP (Head) ×1 IMPLANT
HIP MOD SYSTEM FEM CONE 23 +20 (Joint) ×2 IMPLANT
HIP MODULAR SYSTEM 155X18MM (Hips) ×2 IMPLANT
HOOD PEEL AWAY FLYTE STAYCOOL (MISCELLANEOUS) ×8 IMPLANT
JET LAVAGE IRRISEPT WOUND (IRRIGATION / IRRIGATOR)
KIT BASIN OR (CUSTOM PROCEDURE TRAY) ×2 IMPLANT
KIT TURNOVER KIT A (KITS) ×2 IMPLANT
LAVAGE JET IRRISEPT WOUND (IRRIGATION / IRRIGATOR) IMPLANT
MANIFOLD NEPTUNE II (INSTRUMENTS) ×2 IMPLANT
NDL SAFETY ECLIPSE 18X1.5 (NEEDLE) ×1 IMPLANT
NDL SPNL 18GX3.5 QUINCKE PK (NEEDLE) ×1 IMPLANT
NEEDLE HYPO 18GX1.5 SHARP (NEEDLE) ×1
NEEDLE SPNL 18GX3.5 QUINCKE PK (NEEDLE) ×2 IMPLANT
NS IRRIG 1000ML POUR BTL (IV SOLUTION) ×4 IMPLANT
PACK TOTAL JOINT (CUSTOM PROCEDURE TRAY) ×2 IMPLANT
PENCIL SMOKE EVACUATOR (MISCELLANEOUS) IMPLANT
PILLOW ABDUCTION MEDIUM (MISCELLANEOUS) ×1 IMPLANT
PRESSURIZER FEMORAL UNIV (MISCELLANEOUS) IMPLANT
PROTECTOR NERVE ULNAR (MISCELLANEOUS) ×2 IMPLANT
SEALER BIPOLAR AQUA 6.0 (INSTRUMENTS) ×2 IMPLANT
SET HNDPC FAN SPRY TIP SCT (DISPOSABLE) ×1 IMPLANT
SLEEVE CABLE 2MM VT (Orthopedic Implant) ×3 IMPLANT
SPONGE GAUZE 2X2 STER 10/PKG (GAUZE/BANDAGES/DRESSINGS)
SPONGE LAP 18X18 RF (DISPOSABLE) ×2 IMPLANT
STAPLER VISISTAT 35W (STAPLE) ×3 IMPLANT
STOCKINETTE 8 INCH (MISCELLANEOUS) ×2 IMPLANT
SUCTION FRAZIER HANDLE 12FR (TUBING) ×1
SUCTION TUBE FRAZIER 12FR DISP (TUBING) ×1 IMPLANT
SUT ETHIBOND NAB CT1 #1 30IN (SUTURE) ×2 IMPLANT
SUT MNCRL AB 3-0 PS2 18 (SUTURE) IMPLANT
SUT MON AB 2-0 CT1 36 (SUTURE) ×5 IMPLANT
SUT STRATAFIX PDO 1 14 VIOLET (SUTURE) ×2
SUT STRATFX PDO 1 14 VIOLET (SUTURE) ×2
SUT VIC AB 1 CT1 36 (SUTURE) ×4 IMPLANT
SUT VIC AB 1 CTX 36 (SUTURE) ×2
SUT VIC AB 1 CTX36XBRD ANBCTR (SUTURE) IMPLANT
SUT VIC AB 2-0 CT1 27 (SUTURE) ×1
SUT VIC AB 2-0 CT1 TAPERPNT 27 (SUTURE) ×1 IMPLANT
SUTURE STRATFX PDO 1 14 VIOLET (SUTURE) ×2 IMPLANT
SYSTEM MDLR HIP FEM CON 23 +20 (Joint) IMPLANT
SYSTEM MODULAR HIP 155X18MM (Hips) IMPLANT
TOWEL OR 17X26 10 PK STRL BLUE (TOWEL DISPOSABLE) ×4 IMPLANT
TOWER CARTRIDGE SMART MIX (DISPOSABLE) IMPLANT
TRAY FOLEY MTR SLVR 16FR STAT (SET/KITS/TRAYS/PACK) ×2 IMPLANT
WATER STERILE IRR 1000ML POUR (IV SOLUTION) ×2 IMPLANT

## 2021-01-24 NOTE — Progress Notes (Signed)
[place] for Heparin Indication: bridging from Xarelto for AFib for Hip fx repair  Allergies  Allergen Reactions  . Lisinopril Cough  . Percocet [Oxycodone-Acetaminophen] Other (See Comments)    Hallucinations  . Valium [Diazepam] Other (See Comments)    Confusion  . Prednisone Anxiety and Other (See Comments)     Dizziness, weakness    Patient Measurements: Height: 5\' 7"  (170.2 cm) Weight: 82.3 kg (181 lb 7 oz) IBW/kg (Calculated) : 61.6 Heparin Dosing Weight: 78.6 kg  Vital Signs: Temp: 99.2 F (37.3 C) (05/05 0918) Temp Source: Oral (05/05 0918) BP: 121/59 (05/05 0918) Pulse Rate: 77 (05/05 0918)  Labs: Recent Labs    01/21/21 2058 01/22/21 0328 01/22/21 1100 01/22/21 2103 01/23/21 0421 01/23/21 1254 01/23/21 2215 01/24/21 0344  HGB 12.7 12.7  --   --  12.4  --   --  11.1*  HCT 38.9 39.4  --   --  37.7  --   --  34.7*  PLT 157 164  --   --  146*  --   --  149*  APTT  --   --  31  --   --   --   --   --   HEPARINUNFRC  --   --  0.13*   < > 0.15* 0.18* 0.15*  --   CREATININE 0.74 0.62  --   --   --   --   --  0.72   < > = values in this interval not displayed.   Estimated Creatinine Clearance: 58.8 mL/min (by C-G formula based on SCr of 0.72 mg/dL).  Medications:  Scheduled:  . chlorhexidine  60 mL Topical Once  . [MAR Hold] insulin aspart  0-15 Units Subcutaneous TID WC  . [MAR Hold] insulin aspart  0-5 Units Subcutaneous QHS  . [MAR Hold] insulin glargine  20 Units Subcutaneous Daily  . [MAR Hold] labetalol  100 mg Oral BID  . [MAR Hold] mupirocin ointment  1 application Nasal BID  . povidone-iodine  2 application Topical Once  . povidone-iodine  2 application Topical Once  . [MAR Hold] sertraline  100 mg Oral Daily   Infusions:  . sodium chloride 75 mL/hr at 01/24/21 0825  . [MAR Hold] cefTRIAXone (ROCEPHIN)  IV Stopped (01/23/21 1904)  . [MAR Hold] diltiazem (CARDIZEM) infusion 5 mg/hr (01/24/21 1056)  .  lactated ringers 20 mL/hr at 01/24/21 1056    Assessment: 59 yoF [facility] with [facility] hip Fx. Transfer to [facility] on 5/2, plan Hip fx repair 5/5.  Hx Afib on Xarelto 20mg  qd, per notes was held at [place]. Was noted in Afib with RVR on arrival to [facility]  Heparin infusion for bridging around surgery, ordered baseline aPTT and Hep level - both low Baseline aPTT & Hep level low as expected  Today, 01/24/2021 PM:  Heparin currently off for ORIF today  All previous heparin levels have been low despite multiple rate increases; last rate 1550 units/hr  Hgb slightly low today, previously WNL; Plt slightly low but stable  Goal of Therapy:  Heparin level 0.3-0.7 units/ml Monitor platelets by anticoagulation protocol: Yes   Plan:   F/u plans for resuming heparin at 1550 units/hr vs resuming PTA [place], PharmD, BCPS [phone] 01/24/2021, 1:32 PM

## 2021-01-24 NOTE — Progress Notes (Signed)
PROGRESS NOTE    Lori Rodriguez  [id number] DOB: 02/04/1935 DOA: 01/21/2021 PCP: Penelope Coop, FNP   Chief Complain: Transfer from [facility]  Brief Narrative: Patient is a 86 year old female with history of paroxysmal A. fib on anticoagulation with Xarelto, CKD status 3A, diabetes type 2, hypertension, hyperlipidemia, history of breast cancer, diastolic dysfunction who was sent from [facility] for the management of right nondisplaced fracture of her prosthetic hip as per the report she had a mechanical fall when she slipped over a rug.  Orthopedics planning for ORIF today.  Hospital course remarkable for A. fib with RVR, intermittent fever  Assessment & Plan:   Principal Problem:   Femur fracture ([place]) Active Problems:   Mixed hyperlipidemia   Long term (current) use of anticoagulants   Atrial fibrillation (HCC)   Chronic kidney disease   Diabetes mellitus without complication ([place])   Right femoral periprosthetic fracture: Transferred here from [facility] for orthopedic management.  Continue pain management, supportive care.  Plan for ORIF today. PT/OT will consult after surgery.  A. fib with RVR: Takes labetalol for rate control, on Xarelto for anticoagulation.  She was on A. fib with RVR after admission here.  Started heparin drip for anticoagulation.  Xarelto can be started after surgery. Also had to be started on Cardizem drip for A. fib with RVR.  Monitor on telemetry.  Diabetes type 2: Monitor blood sugars.  Continue sliding scale insulin and Lantus.  Recent hemoglobin A1c of 7.1.  Hyperlipidemia: Continue statin  CKD stage IIIa: Currently kidney function at baseline  Coronary artery disease: Denies any chest pain.  Currently stable.  Echocardiogram showed EF of 55 to 60%, indeterminate left ventricular diastolic parameter, mildly reduced right ventricular systolic function, moderately elevated pulmonary artery pressure .follows with Dr  Bettina Gavia  History of breast cancer: We recommend oncology follow-up as an outpatient  Hypertension: Currently blood pressure stable.  Continue as needed medications for severe hypertension.  Leukocytosis/intermittent fever: Mild leukocytosis.  She has spiked mild grade fever intermittently  .  Urine has history of UTI.  Send blood culture, urine culture.  Patient has history of UTI in the past and she was having some dysuria.  Started on ceftriaxone 1 g daily.  Chest x-ray does not show any pneumonia.  History of depression: On Zoloft         DVT prophylaxis:Heparin IV Code Status: Full Family Communication: Called daughter on phone on 01/22/21 Status is: Inpatient  Remains inpatient appropriate because:Inpatient level of care appropriate due to severity of illness   Dispo: The patient is from: Home              Anticipated d/c is to: SNF              Patient currently is not medically stable to d/c.   Difficult to place patient No    Consultants: Ortho  Procedures:None  Antimicrobials:  Anti-infectives (From admission, onward)   Start     Dose/Rate Route Frequency Ordered Stop   01/24/21 1000  ceFAZolin (ANCEF) IVPB 2g/100 mL premix        2 g 200 mL/hr over 30 Minutes Intravenous On call to O.R. 01/24/21 0905 01/24/21 1144   01/23/21 1815  [MAR Hold]  cefTRIAXone (ROCEPHIN) 1 g in sodium chloride 0.9 % 100 mL IVPB        (MAR Hold since Thu 01/24/2021 at 0907.Hold Reason: Transfer to a Procedural area.)   1 g 200 mL/hr over  30 Minutes Intravenous Every 24 hours 01/23/21 1726        Subjective:  Patient seen and examined at bedside this morning.  Heart rate overall controlled this morning, she was on Cardizem drip.  Denies chest pain shortness of breath or abdominal pain or cough.  Waiting for surgery today.  Objective: Vitals:   01/23/21 1900 01/23/21 2035 01/24/21 0432 01/24/21 0918  BP:  113/61 130/71 (!) 121/59  Pulse:  85 71 77  Resp:  19 20 18   Temp: 98 F  (36.7 C) 98 F (36.7 C) 100 F (37.8 C) 99.2 F (37.3 C)  TempSrc: Oral Oral Oral Oral  SpO2:  98% 98% 95%  Weight:      Height:        Intake/Output Summary (Last 24 hours) at 01/24/2021 1314 Last data filed at 01/24/2021 1217 Gross per 24 hour  Intake 2487.48 ml  Output 930 ml  Net 1557.48 ml   Filed Weights   01/21/21 2000 01/21/21 2235  Weight: 81.2 kg 82.3 kg    Examination:   General exam: Overall comfortable, not in distress, pleasant elderly female HEENT: PERRL Respiratory system:  no wheezes or crackles  Cardiovascular system: Irregularly irregular rhythm Gastrointestinal system: Abdomen is nondistended, soft and nontender. Central nervous system: Alert and oriented Extremities: No edema, no clubbing ,no cyanosis Skin: No rashes, no ulcers,no icterus     Data Reviewed: I have personally reviewed following labs and imaging studies  CBC: Recent Labs  Lab 01/21/21 2058 01/22/21 0328 01/23/21 0421 01/24/21 0344  WBC 10.4 12.0* 11.6* 12.5*  NEUTROABS  --   --  8.5*  --   HGB 12.7 12.7 12.4 11.1*  HCT 38.9 39.4 37.7 34.7*  MCV 90.3 89.5 88.7 91.1  PLT 157 164 146* 937*   Basic Metabolic Panel: Recent Labs  Lab 01/21/21 2058 01/22/21 0328 01/24/21 0344  NA  --  135 133*  K  --  4.0 4.0  CL  --  101 100  CO2  --  24 22  GLUCOSE  --  178* 198*  BUN  --  17 23  CREATININE 0.74 0.62 0.72  CALCIUM  --  9.1 8.7*   GFR: Estimated Creatinine Clearance: 58.8 mL/min (by C-G formula based on SCr of 0.72 mg/dL). Liver Function Tests: Recent Labs  Lab 01/22/21 0328  AST 16  ALT 13  ALKPHOS 30*  BILITOT 0.8  PROT 6.9  ALBUMIN 3.8   No results for input(s): LIPASE, AMYLASE in the last 168 hours. No results for input(s): AMMONIA in the last 168 hours. Coagulation Profile: No results for input(s): INR, PROTIME in the last 168 hours. Cardiac Enzymes: No results for input(s): CKTOTAL, CKMB, CKMBINDEX, TROPONINI in the last 168 hours. BNP (last 3  results) Recent Labs    05/25/20 1521  PROBNP 587   HbA1C: Recent Labs    01/21/21 2058  HGBA1C 7.1*   CBG: Recent Labs  Lab 01/23/21 0808 01/23/21 1231 01/23/21 1728 01/23/21 2026 01/24/21 0751  GLUCAP 179* 302* 259* 219* 198*   Lipid Profile: No results for input(s): CHOL, HDL, LDLCALC, TRIG, CHOLHDL, LDLDIRECT in the last 72 hours. Thyroid Function Tests: No results for input(s): TSH, T4TOTAL, FREET4, T3FREE, THYROIDAB in the last 72 hours. Anemia Panel: No results for input(s): VITAMINB12, FOLATE, FERRITIN, TIBC, IRON, RETICCTPCT in the last 72 hours. Sepsis Labs: No results for input(s): PROCALCITON, LATICACIDVEN in the last 168 hours.  Recent Results (from the past 240 hour(s))  Surgical PCR  screen     Status: None   Collection Time: 01/21/21 10:54 PM   Specimen: Nasal Mucosa; Nasal Swab  Result Value Ref Range Status   MRSA, PCR NEGATIVE NEGATIVE Final   Staphylococcus aureus NEGATIVE NEGATIVE Final    Comment: (NOTE) The Xpert SA Assay (FDA approved for NASAL specimens in patients 18 years of age and older), is one component of a comprehensive surveillance program. It is not intended to diagnose infection nor to guide or monitor treatment. Performed at [facility], [place] [address], [place], [place] [address]   Culture, blood (routine x 2)     Status: None (Preliminary result)   Collection Time: 01/23/21  8:44 AM   Specimen: BLOOD  Result Value Ref Range Status   Specimen Description   Final    BLOOD LEFT ANTECUBITAL Performed at [place] [address], [place], [place] [address]    Special Requests   Final    BOTTLES DRAWN AEROBIC AND ANAEROBIC Blood Culture results may not be optimal due to an excessive volume of blood received in culture bottles Performed at [facility] [address], [place], [place] [address]    Culture   Final    NO GROWTH < 24 HOURS Performed at [facility] [address] [place], [place] [address]    Report Status PENDING  Incomplete  Culture, blood (routine x 2)     Status: None (Preliminary result)   Collection Time: 01/23/21  8:49 AM   Specimen: BLOOD  Result Value Ref Range Status   Specimen Description   Final    BLOOD BLOOD RIGHT HAND Performed at [facility] [address], [place], [place] [address]    Special Requests   Final    BOTTLES DRAWN AEROBIC AND ANAEROBIC Blood Culture adequate volume Performed at [facility] [address] [place], [place] [address]    Culture   Final    NO GROWTH < 24 HOURS Performed at [facility] [address], [place],  [address]    Report Status PENDING  Incomplete  SARS Coronavirus 2 by RT PCR (hospital order, performed in [facility] hospital lab) Nasopharyngeal     Status: None   Collection Time: 01/23/21  1:26 PM   Specimen: Nasopharyngeal  Result Value Ref Range Status   SARS Coronavirus 2 NEGATIVE NEGATIVE Final    Comment: (NOTE) SARS-CoV-2 target nucleic acids are NOT DETECTED.  The SARS-CoV-2 RNA is generally detectable in upper and lower respiratory specimens during the acute phase of infection. The lowest concentration of SARS-CoV-2 viral copies this assay can detect is 250 copies / mL. A negative result does not preclude SARS-CoV-2 infection and should not be used as the sole basis for treatment or other patient management decisions.  A negative result may occur with improper specimen collection / handling, submission of specimen other than nasopharyngeal swab, presence of viral mutation(s) within the areas targeted by this assay, and inadequate number of viral copies (<250 copies / mL). A negative result must be combined with clinical observations, patient history, and epidemiological information.  Fact Sheet for Patients:   StrictlyIdeas.no  Fact Sheet for Healthcare  Providers: BankingDealers.co.za  This test is not yet approved or  cleared by the [place] FDA and has been authorized for detection and/or diagnosis of SARS-CoV-2 by FDA under an Emergency Use Authorization (EUA).  This EUA will remain in effect (meaning this test  can be used) for the duration of the COVID-19 declaration under Section 564(b)(1) of the Act, 21 U.S.C. section 360bbb-3(b)(1), unless the authorization is terminated or revoked sooner.  Performed at [facility], [place] [address] [place], [place] [address]          Radiology Studies: DG CHEST PORT 1 VIEW  Result Date: 01/23/2021 CLINICAL DATA:  Fever EXAM: PORTABLE CHEST 1 VIEW COMPARISON:  01/19/2021 FINDINGS: Lungs are clear. Chin overlies the lung apices, however, no definite pneumothorax identified. No pleural effusion. Mild cardiomegaly is stable. Hilar enlargement is in keeping with pulmonary arterial enlargement centrally, better appreciated on a CT examination of 02/21/2020 in keeping with changes of pulmonary arterial hypertension. Pulmonary vascularity is otherwise normal. No acute bone abnormality. IMPRESSION: Stable cardiomegaly and central pulmonary arterial enlargement. Electronically Signed   By: Fidela Salisbury MD   On: 01/23/2021 20:33   ECHOCARDIOGRAM COMPLETE  Result Date: 01/23/2021    ECHOCARDIOGRAM REPORT   Patient Name:   Lori Rodriguez [facility] Date of Exam: 01/23/2021 Medical Rec #:  [id number]                Height:       67.0 in Accession #:    [id number]               Weight:       181.4 lb Date of Birth:  January 06, 1935                BSA:          1.940 m Patient Age:    67 years                 BP:           152/73 mmHg Patient Gender: F                        HR:           88 bpm. Exam Location:  Inpatient Procedure: 2D Echo, Cardiac Doppler and Color Doppler Indications:    Pre-op cardiac evaluation  History:        Patient has prior history of Echocardiogram  examinations, most                 recent 05/14/2020. CHF, Pulmonary HTN, Arrythmias:Atrial                 Fibrillation; Risk Factors:Diabetes, Hypertension and                 Dyslipidemia.  Sonographer:    Luisa Hart RDCS Referring Phys: [place]  1. Left ventricular ejection fraction, by estimation, is 55 to 60%. The left ventricle has normal function. The left ventricle has no regional wall motion abnormalities. Left ventricular diastolic parameters are indeterminate.  2. Right ventricular systolic function is mildly reduced. The right ventricular size is mildly enlarged. There is moderately elevated pulmonary artery systolic pressure.  3. Left atrial size was moderately dilated.  4. Right atrial size was moderately dilated.  5. The mitral valve is degenerative. Trivial mitral valve regurgitation. No evidence of mitral stenosis.  6. The aortic valve was not well visualized. There is mild calcification of the aortic valve. Aortic valve regurgitation is not visualized. Mild aortic valve sclerosis is present, with no evidence of aortic valve stenosis.  7. The inferior vena cava is normal in size with greater than 50% respiratory variability, suggesting right atrial  pressure of 3 mmHg. FINDINGS  Left Ventricle: Left ventricular ejection fraction, by estimation, is 55 to 60%. The left ventricle has normal function. The left ventricle has no regional wall motion abnormalities. The left ventricular internal cavity size was normal in size. There is  no left ventricular hypertrophy. Left ventricular diastolic parameters are indeterminate. Right Ventricle: The right ventricular size is mildly enlarged. Right vetricular wall thickness was not assessed. Right ventricular systolic function is mildly reduced. There is moderately elevated pulmonary artery systolic pressure. The tricuspid regurgitant velocity is 3.31 m/s, and with an assumed right atrial pressure of 3 mmHg, the estimated right  ventricular systolic pressure is 46.8 mmHg. Left Atrium: Left atrial size was moderately dilated. Right Atrium: Right atrial size was moderately dilated. Pericardium: There is no evidence of pericardial effusion. Mitral Valve: The mitral valve is degenerative in appearance. There is mild thickening of the mitral valve leaflet(s). There is mild calcification of the mitral valve leaflet(s). Mild mitral annular calcification. Trivial mitral valve regurgitation. No evidence of mitral valve stenosis. Tricuspid Valve: The tricuspid valve is normal in structure. Tricuspid valve regurgitation is mild . No evidence of tricuspid stenosis. Aortic Valve: The aortic valve was not well visualized. There is mild calcification of the aortic valve. Aortic valve regurgitation is not visualized. Mild aortic valve sclerosis is present, with no evidence of aortic valve stenosis. Aortic valve mean gradient measures 3.5 mmHg. Aortic valve peak gradient measures 7.1 mmHg. Aortic valve area, by VTI measures 2.44 cm. Pulmonic Valve: The pulmonic valve was normal in structure. Pulmonic valve regurgitation is not visualized. No evidence of pulmonic stenosis. Aorta: The aortic root is normal in size and structure. Venous: The inferior vena cava is normal in size with greater than 50% respiratory variability, suggesting right atrial pressure of 3 mmHg. IAS/Shunts: No atrial level shunt detected by color flow Doppler.  LEFT VENTRICLE PLAX 2D LVIDd:         5.30 cm     Diastology LVIDs:         3.70 cm     LV e' medial:    5.00 cm/s LV PW:         1.20 cm     LV E/e' medial:  31.4 LV IVS:        0.90 cm     LV e' lateral:   6.09 cm/s LVOT diam:     1.90 cm     LV E/e' lateral: 25.8 LV SV:         51 LV SV Index:   26 LVOT Area:     2.84 cm  LV Volumes (MOD) LV vol d, MOD A2C: 39.6 ml LV vol d, MOD A4C: 54.8 ml LV vol s, MOD A2C: 18.5 ml LV vol s, MOD A4C: 20.4 ml LV SV MOD A2C:     21.1 ml LV SV MOD A4C:     54.8 ml LV SV MOD BP:      27.9 ml  RIGHT VENTRICLE RV S prime:     8.92 cm/s TAPSE (M-mode): 1.2 cm LEFT ATRIUM             Index       RIGHT ATRIUM           Index LA diam:        4.60 cm 2.37 cm/m  RA Area:     24.00 cm LA Vol (A2C):   47.3 ml 24.38 ml/m RA Volume:   67.20 ml  34.63 ml/m  LA Vol (A4C):   50.7 ml 26.13 ml/m LA Biplane Vol: 50.4 ml 25.98 ml/m  AORTIC VALVE                   PULMONIC VALVE AV Area (Vmax):    2.29 cm    PV Vmax:       1.03 m/s AV Area (Vmean):   2.34 cm    PV Vmean:      79.000 cm/s AV Area (VTI):     2.44 cm    PV VTI:        0.189 m AV Vmax:           133.50 cm/s PV Peak grad:  4.2 mmHg AV Vmean:          92.100 cm/s PV Mean grad:  3.0 mmHg AV VTI:            0.209 m AV Peak Grad:      7.1 mmHg AV Mean Grad:      3.5 mmHg LVOT Vmax:         108.00 cm/s LVOT Vmean:        75.900 cm/s LVOT VTI:          0.180 m LVOT/AV VTI ratio: 0.86  AORTA Ao Root diam: 3.30 cm Ao Asc diam:  3.00 cm MITRAL VALVE                TRICUSPID VALVE MV Area (PHT): 5.02 cm     TR Peak grad:   43.8 mmHg MV Decel Time: 151 msec     TR Vmax:        331.00 cm/s MR Peak grad: 79.9 mmHg MR Vmax:      447.00 cm/s   SHUNTS MV E velocity: 157.00 cm/s  Systemic VTI:  0.18 m                             Systemic Diam: 1.90 cm Jenkins Rouge MD Electronically signed by Jenkins Rouge MD Signature Date/Time: 01/23/2021/2:20:48 PM    Final         Scheduled Meds: . chlorhexidine  60 mL Topical Once  . [MAR Hold] insulin aspart  0-15 Units Subcutaneous TID WC  . [MAR Hold] insulin aspart  0-5 Units Subcutaneous QHS  . [MAR Hold] insulin glargine  20 Units Subcutaneous Daily  . [MAR Hold] labetalol  100 mg Oral BID  . [MAR Hold] mupirocin ointment  1 application Nasal BID  . povidone-iodine  2 application Topical Once  . povidone-iodine  2 application Topical Once  . [MAR Hold] sertraline  100 mg Oral Daily   Continuous Infusions: . sodium chloride 75 mL/hr at 01/24/21 0825  . [MAR Hold] cefTRIAXone (ROCEPHIN)  IV Stopped (01/23/21  1904)  . [MAR Hold] diltiazem (CARDIZEM) infusion 5 mg/hr (01/24/21 1056)  . lactated ringers 20 mL/hr at 01/24/21 1056     LOS: 3 days    Time spent: 35 mins.More than 50% of that time was spent in counseling and/or coordination of care.      Shelly Coss, MD [facility] P5/01/2021, 1:14 PM

## 2021-01-24 NOTE — Anesthesia Preprocedure Evaluation (Addendum)
Anesthesia Evaluation  Patient identified by MRN, date of birth, ID band Patient awake    Reviewed: Allergy & Precautions, NPO status , Patient's Chart, lab work & pertinent test results, reviewed documented beta blocker date and time   Airway Mallampati: II  TM Distance: >3 FB Neck ROM: Full    Dental  (+) Poor Dentition, Dental Advisory Given   Pulmonary neg pulmonary ROS,    Pulmonary exam normal breath sounds clear to auscultation       Cardiovascular hypertension, Pt. on medications and Pt. on home beta blockers +CHF (mod pHTN)  Normal cardiovascular exam+ dysrhythmias (xarelto- LD 4/29?) Atrial Fibrillation  Rhythm:Regular Rate:Normal  Echo 01/23/21: 1. Left ventricular ejection fraction, by estimation, is 55 to 60%. The  left ventricle has normal function. The left ventricle has no regional  wall motion abnormalities. Left ventricular diastolic parameters are  indeterminate.  2. Right ventricular systolic function is mildly reduced. The right  ventricular size is mildly enlarged. There is moderately elevated  pulmonary artery systolic pressure.  3. Left atrial size was moderately dilated.  4. Right atrial size was moderately dilated.  5. The mitral valve is degenerative. Trivial mitral valve regurgitation.  No evidence of mitral stenosis.  6. The aortic valve was not well visualized. There is mild calcification  of the aortic valve. Aortic valve regurgitation is not visualized. Mild  aortic valve sclerosis is present, with no evidence of aortic valve  stenosis.  7. The inferior vena cava is normal in size with greater than 50%  respiratory variability, suggesting right atrial pressure of 3 mmHg.   Neuro/Psych negative neurological ROS  negative psych ROS   GI/Hepatic negative GI ROS, Neg liver ROS,   Endo/Other  diabetes, Type 2, Oral Hypoglycemic Agents  Renal/GU Renal disease (hx R renal mass)  negative  genitourinary   Musculoskeletal  (+) Arthritis , Osteoarthritis,  Right femur fx   Abdominal   Peds  Hematology  (+) Blood dyscrasia, anemia , hct 34.7, plt 149   Anesthesia Other Findings   Reproductive/Obstetrics negative OB ROS                            Anesthesia Physical Anesthesia Plan  ASA: III  Anesthesia Plan: Spinal and MAC   Post-op Pain Management:    Induction:   PONV Risk Score and Plan: 2 and Propofol infusion and TIVA  Airway Management Planned: Natural Airway and Nasal Cannula  Additional Equipment: None  Intra-op Plan:   Post-operative Plan:   Informed Consent: I have reviewed the patients History and Physical, chart, labs and discussed the procedure including the risks, benefits and alternatives for the proposed anesthesia with the patient or authorized representative who has indicated his/her understanding and acceptance.     Dental advisory given and Consent reviewed with POA  Plan Discussed with: CRNA  Anesthesia Plan Comments: (Heparin drip off at 0200 D/w daughter )       Anesthesia Quick Evaluation

## 2021-01-24 NOTE — Op Note (Signed)
OPERATIVE REPORT   01/24/2021  2:36 PM  PATIENT:  Lori Rodriguez   SURGEON:  Bertram Savin, MD  ASSISTANT:  Cherlynn June, PA-C.   PREOPERATIVE DIAGNOSIS: Right [place] B2 periprosthetic femur fracture.  POSTOPERATIVE DIAGNOSIS:  Same.  PROCEDURE: Revision femoral component right total hip arthroplasty. Application of negative pressure incisional dressing.    Interpretation of fluoroscopic images.  ANESTHESIA:   Spinal. MAC.  ANTIBIOTICS: 2 g Ancef.  IMPLANTS: Stryker restoration modular splined stem 8 x 173m with 23 mm +20 cone body. Biolox delta ceramic 36+5 mm head ball Dall-Miles 2.0 mm adult reconstruction cable x3  SPECIMENS: None.  TUBES AND DRAINS: Customizable Prevena incisional negative pressure dressing to 125 mmHg.  COMPLICATIONS: None.  DISPOSITION: Stable to PACU.  SURGICAL INDICATIONS:  AKaylani Frommeis a 86y.o. female who underwent previous right total hip arthroplasty at an outside facility with Stryker system.  She was doing well until she had a recent fall with inability to weight-bear.  [facility]  Her Xarelto was held.  X-rays and CT scan showed Vancouver B2 periprosthetic femur fracture.  [facility]  She was admitted to the hospitalist service for perioperative risk ratification medical optimization.  She was found to have a UTI and this was treated with IV antibiotics.  She was indicated for revision femoral component.  The risks, benefits, and alternatives were discussed with the patient. There are risks associated with the surgery including, but not limited to, problems with anesthesia (death), infection, instability (giving out of the joint), dislocation, differences in leg length/angulation/rotation, fracture of bones, loosening or failure of implants, hematoma (blood accumulation) which may require surgical  drainage, blood clots, pulmonary embolism, nerve injury (foot drop and lateral thigh numbness), and blood vessel injury. The patient understands these risks and elects to proceed.  PROCEDURE IN DETAIL: The patient was identified in the holding area using 2 identifiers.  The surgical site was marked by myself.  She was taken to the operating room and spinal anesthesia was induced.  A Foley catheter was placed.  She was placed in the left lateral decubitus position and secured with a beanbag.  All bony prominences were well-padded.  An axillary roll was placed.  The right hip was prepped and draped in the normal sterile surgical fashion.  A timeout was called, verifying site and site of surgery.  She did receive IV antibiotics within 60 minutes of beginning the procedure.  I examined the right hip.  She had previous incision compatible with direct lateral approach.  I made a longitudinal incision for posterior approach.  Full-thickness skin flaps were created.  The IT band was split in line with fibers.  The sciatic nerve was palpated and protected throughout the case.  Charnley retractor was placed.  The posterior aspect of the vastus lateralis was identified and the overlying fascia was incised.  The vastus lateralis was taken off of the posterior intermuscular septum.  The muscle was retracted anteriorly.  I was able to identify the fracture.  A single subperiosteal prophylactic adult reconstruction cable was placed just distal to the fracture.  A subperiosteal adult reconstruction cable was placed just distal to the lesser trochanter and one was placed over the distal two thirds of the fracture.  The cables were provisionally tightened.  The short external rotators and posterior capsule were taken down in a single layer.  I was able  to identify the head ball.  The femoral component had subsided so that the head ball was sitting on the femoral neck remnant.  Using a bone hook, I gently dislocated the hip.  The  lateral shoulder was cleared with Bovie electrocautery and a rongeur.  Using a bone tamp, I attempted to remove the head ball, and the entire femoral prosthesis easily came out.  I then sequentially reamed up to an 18 mm reamer planning for a 155 mm stem.  AP and lateral fluoroscopy views were used to confirm size of the reamer, reduction of the fracture, placement of the cables, and to verify that the fracture was bypassed by 4 cortices.  The real 18 mm conical tapered stem was placed.  I then reamed proximately for a 23 mm cone body.  The trial body and trial head were placed, and the hip was reduced.  The +5 mm head ball restored leg length and gave excellent stability.  The hip was then dislocated and the trial cone body was removed.  The real cone body was placed and impacted onto the stem.  Appropriate version was maintained.  The bolt was then tightened to [employer] specifications.  The real 36+5 head ball was then impacted onto the trunnion.  The hip was reduced.  Stability testing was performed.  In full extension, there was no dislocation or impingement and full external rotation.  The hip was stable in potty position.  At 90 degrees of flexion and neutral abduction, I was able to internally rotate her to about 80 degrees before there was any impingement.  The reconstruction cables were then tightened 1 final time, crimped, and clipped.  The wound was copiously irrigated with Irrisept solution followed by normal saline with pulse lavage.  The posterior capsule was repaired with #1 Vicryl sutures.  The IT band was reapproximated with #1 Vicryl and #1 strata fix suture.  The deep dermal layer was closed with 2-0 Monocryl sutures.  The skin was reapproximated with staples.  Customizable Prevena was applied and suction was hooked up to 125 mmHg.  There was excellent seal without leak.  An abduction wedge was then placed.  The patient was then flipped supine, aroused from anesthesia, and taken to the  PACU in stable condition.  Sponge, needle, and instrument counts were correct at the end of the case x2.  There were no known complications.  POSTOPERATIVE PLAN: Postoperatively, the patient will be readmitted to the hospitalist.  Touchdown weightbearing right lower extremity with a walker.  Posterior hip precautions.  An abduction brace has been ordered from [employer] orth[employer].  Once the brace arrives, discontinue abduction wedge.  The abduction brace will be worn at all times except for hygiene.  Beginning tomorrow morning, resume Xarelto at 10 mg daily.  After 48 to 72 hours, the full home dose may be resumed.  Mobilize out of bed with PT/OT.  Continue incisional wound VAC.  Upon discharge, the house VAC suction unit will be exchanged for a portable Prevena suction unit.  The patient will undergo disposition planning.  She will need to call the office to schedule a follow-up appointment within 7 days of discharge for removal of her negative pressure dressing.

## 2021-01-24 NOTE — Anesthesia Postprocedure Evaluation (Signed)
Anesthesia Post Note  Patient: Lori Rodriguez  Procedure(s) Performed: TOTAL HIP REVISION (Right Hip)     Patient location during evaluation: PACU Anesthesia Type: MAC and Spinal Level of consciousness: awake and alert and oriented Pain management: pain level controlled Vital Signs Assessment: post-procedure vital signs reviewed and stable Respiratory status: spontaneous breathing, nonlabored ventilation and respiratory function stable Cardiovascular status: blood pressure returned to baseline and stable Postop Assessment: no headache, no backache, spinal receding and patient able to bend at knees Anesthetic complications: no   No complications documented.  Last Vitals:  Vitals:   01/24/21 1437 01/24/21 1445  BP: (!) 110/48 108/66  Pulse: 77 83  Resp: 10 19  Temp: 36.6 C   SpO2: 100% 100%    Last Pain:  Vitals:   01/24/21 1445  TempSrc:   PainSc: 0-No pain                 Pervis Hocking

## 2021-01-24 NOTE — Transfer of Care (Signed)
Immediate Anesthesia Transfer of Care Note  Patient: Lori Rodriguez  Procedure(s) Performed: TOTAL HIP REVISION (Right Hip)  Patient Location: PACU  Anesthesia Type:MAC and Spinal  Level of Consciousness: awake, alert , oriented and patient cooperative  Airway & Oxygen Therapy: Patient Spontanous Breathing and Patient connected to face mask oxygen  Post-op Assessment: Report given to RN, Post -op Vital signs reviewed and stable and Patient moving all extremities  Post vital signs: Reviewed and stable  Last Vitals:  Vitals Value Taken Time  BP 110/48 01/24/21 1437  Temp    Pulse 85 01/24/21 1439  Resp 18 01/24/21 1439  SpO2 100 % 01/24/21 1439  Vitals shown include unvalidated device data.  Last Pain:  Vitals:   01/24/21 0918  TempSrc: Oral  PainSc:       Patients Stated Pain Goal: 3 (35/68/61 6837)  Complications: No complications documented.

## 2021-01-24 NOTE — Interval H&P Note (Signed)
History and Physical Interval Note:  01/24/2021 9:39 AM  Lori Rodriguez  has presented today for surgery, with the diagnosis of RIght Perprostetic Femoral Fracture.  The various methods of treatment have been discussed with the patient and family. After consideration of risks, benefits and other options for treatment, the patient has consented to  Procedure(s): TOTAL HIP REVISION (Right) as a surgical intervention.  The patient's history has been reviewed, patient examined, no change in status, stable for surgery.  I have reviewed the patient's chart and labs.  Questions were answered to the patient's satisfaction.    The risks, benefits, and alternatives were discussed with the patient. There are risks associated with the surgery including, but not limited to, problems with anesthesia (death), infection, instability (giving out of the joint), dislocation, differences in leg length/angulation/rotation, fracture of bones, loosening or failure of implants, hematoma (blood accumulation) which may require surgical drainage, blood clots, pulmonary embolism, nerve injury (foot drop and lateral thigh numbness), and blood vessel injury. The patient understands these risks and elects to proceed.    Hilton Cork Merida Alcantar

## 2021-01-24 NOTE — Anesthesia Procedure Notes (Signed)
Spinal  Patient location during procedure: OR Start time: 01/24/2021 11:06 AM End time: 01/24/2021 11:12 AM Reason for block: surgical anesthesia Staffing Performed: anesthesiologist  Anesthesiologist: Pervis Hocking, DO Preanesthetic Checklist Completed: patient identified, IV checked, risks and benefits discussed, surgical consent, monitors and equipment checked, pre-op evaluation and timeout performed Spinal Block Patient position: right lateral decubitus Prep: DuraPrep and site prepped and draped Patient monitoring: cardiac monitor, continuous pulse ox and blood pressure Approach: midline Location: L3-4 Injection technique: single-shot Needle Needle type: Pencan  Needle gauge: 24 G Needle length: 9 cm Assessment Sensory level: T6 Events: CSF return Additional Notes Functioning IV was confirmed and monitors were applied. Sterile prep and drape, including hand hygiene and sterile gloves were used. The patient was positioned and the spine was prepped. The skin was anesthetized with lidocaine.  Free flow of clear CSF was obtained prior to injecting local anesthetic into the CSF.  The spinal needle aspirated freely following injection.  The needle was carefully withdrawn.  The patient tolerated the procedure well.

## 2021-01-25 LAB — CBC
HCT: 27.5 % — ABNORMAL LOW (ref 36.0–46.0)
Hemoglobin: 9 g/dL — ABNORMAL LOW (ref 12.0–15.0)
MCH: 29.6 pg (ref 26.0–34.0)
MCHC: 32.7 g/dL (ref 30.0–36.0)
MCV: 90.5 fL (ref 80.0–100.0)
Platelets: 156 10*3/uL (ref 150–400)
RBC: 3.04 MIL/uL — ABNORMAL LOW (ref 3.87–5.11)
RDW: 13.2 % (ref 11.5–15.5)
WBC: 14.7 10*3/uL — ABNORMAL HIGH (ref 4.0–10.5)
nRBC: 0 % (ref 0.0–0.2)

## 2021-01-25 LAB — GLUCOSE, CAPILLARY
Glucose-Capillary: 226 mg/dL — ABNORMAL HIGH (ref 70–99)
Glucose-Capillary: 294 mg/dL — ABNORMAL HIGH (ref 70–99)
Glucose-Capillary: 282 mg/dL — ABNORMAL HIGH (ref 70–99)
Glucose-Capillary: 306 mg/dL — ABNORMAL HIGH (ref 70–99)

## 2021-01-25 LAB — BASIC METABOLIC PANEL
Anion gap: 14 (ref 5–15)
BUN: 27 mg/dL — ABNORMAL HIGH (ref 8–23)
CO2: 17 mmol/L — ABNORMAL LOW (ref 22–32)
Calcium: 8.7 mg/dL — ABNORMAL LOW (ref 8.9–10.3)
Chloride: 102 mmol/L (ref 98–111)
Creatinine, Ser: 0.56 mg/dL (ref 0.44–1.00)
GFR, Estimated: >60 mL/min (ref >60)
Glucose, Bld: 215 mg/dL — ABNORMAL HIGH (ref 70–99)
Potassium: 4.1 mmol/L (ref 3.5–5.1)
Sodium: 133 mmol/L — ABNORMAL LOW (ref 135–145)

## 2021-01-25 LAB — URINE CULTURE

## 2021-01-25 MED ORDER — ACETAMINOPHEN 325 MG PO TABS: 325.0000 mg | ORAL_TABLET | Freq: Four times a day (QID) | ORAL | 0 refills | Status: AC | PRN

## 2021-01-25 MED ORDER — HYDROCODONE-ACETAMINOPHEN 7.5-325 MG PO TABS: 1.0000 | ORAL_TABLET | ORAL | 0 refills | Status: AC | PRN

## 2021-01-25 MED ORDER — DILTIAZEM HCL 60 MG PO TABS
60.0000 mg | ORAL_TABLET | Freq: Three times a day (TID) | ORAL | Status: DC
  Administered 2021-01-25 – 2021-01-26 (×3): 60 mg via ORAL
  Filled 2021-01-25 (×3): qty 1

## 2021-01-25 NOTE — Evaluation (Signed)
Physical Therapy Evaluation Patient Details Name: Lori Rodriguez MRN: [id number] DOB: 1934-12-05 Today's Date: 01/25/2021   History of Present Illness  Pt is 86 yo female admitted s/p fall with R nondisplaced fx of prosthetic hip.  She initially went to [facility] and transferred to [facility] due to need for complext surgery.  Pt reports initial fall on 01/19/21.  Pt had revision of R THA on 01/24/21.  She has PMH of Atrial fibrillation, chronic kidney disease stage III, diabetes, hypertension, hyperlipidemia, history of breast cancer on the left  Clinical Impression  Pt admitted with above diagnosis. Considering pt's fall was nearly 1 week ago and has not been out of bed, she did well today.  She was able to tolerate mod A x 2 transfer to EOB and standing x 3.  Unable to pivot to chair due to weakness and maintaining PWB status.  Pt does have posterior hip precautions with hip abduction brace in place. Pt resides with daughter and is normally independent.  She will need SNF at discharge.  Pt currently with functional limitations due to the deficits listed below (see PT Problem List). Pt will benefit from skilled PT to increase their independence and safety with mobility to allow discharge to the venue listed below.       Follow Up Recommendations SNF    Equipment Recommendations  Wheelchair cushion (measurements PT);Wheelchair (measurements PT);Hospital bed (needs further assessment)    Recommendations for Other Services       Precautions / Restrictions Precautions Precautions: Fall;Posterior Hip Precaution Booklet Issued: No Precaution Comments: Educated on precautions Required Braces or Orthoses: Other Brace Other Brace: Hip Abduction brace at all times except for hygiene. Restrictions Weight Bearing Restrictions: Yes RLE Weight Bearing: Partial weight bearing RLE Partial Weight Bearing Percentage or Pounds: 30%      Mobility  Bed Mobility Overal bed mobility:  Needs Assistance Bed Mobility: Supine to Sit;Sit to Supine     Supine to sit: +2 for physical assistance;Mod assist Sit to supine: +2 for physical assistance;Max assist   General bed mobility comments: Slow transfers with cues for technique and to use L LE to push or pull hips toward EOB, pt assisting well and able to lift some with UE.  Helicopter transfer back to supine but pt did assist with scooting back on bed with cues for weight shift    Transfers Overall transfer level: Needs assistance Equipment used: Rolling walker (2 wheeled) Transfers: Sit to/from Stand Sit to Stand: Mod assist;+2 physical assistance         General transfer comment: Sit to stand x 3 with cues for UE placement and Mod A x 2 to rise.  Cues for PWB.  Pt not able to pivot on L LE so was returned to bed.  Ambulation/Gait             General Gait Details: unable  Stairs            Wheelchair Mobility    Modified Rankin (Stroke Patients Only)       Balance Overall balance assessment: Needs assistance Sitting-balance support: Bilateral upper extremity supported Sitting balance-Leahy Scale: Fair Sitting balance - Comments: Bil UE support and leaning L but more for pain control and offloading R hip rather than balance     Standing balance-Leahy Scale: Poor Standing balance comment: Requiring RW and min A of 2 to stand 3 times for 20-30 sec  Pertinent Vitals/Pain Pain Assessment: 0-10    Home Living Family/patient expects to be discharged to:: Skilled nursing facility Living Arrangements: Children Available Help at Discharge: Family;Available PRN/intermittently (lives with dtr who works) Type of Home: House Home Access: Stairs to enter   [employer] of Steps: 1 Home Layout: One level Home Equipment: Toilet riser;Bedside commode;Walker - 2 wheels      Prior Function Level of Independence: Needs assistance   Gait / Transfers  Assistance Needed: Can ambulate in community -uses RW or grocery cart  ADL's / Homemaking Assistance Needed: Reports independent with ADLs, does sponge baths; her and daughter share in IADLs - she can cook a meal        Hand Dominance        Extremity/Trunk Assessment   Upper Extremity Assessment Upper Extremity Assessment: LUE deficits/detail;RUE deficits/detail RUE Deficits / Details: ROM WFL; MMT 4/5 throughout - expected for age LUE Deficits / Details: ROM WFL; MMT 4/5 throughout - expected for age    Lower Extremity Assessment Lower Extremity Assessment: LLE deficits/detail;RLE deficits/detail RLE Deficits / Details: Expected post op changes.  ROM: within hip precautions; MMT : ankle 5/5, knee and hip 1/5 LLE Deficits / Details: WFL    Cervical / Trunk Assessment Cervical / Trunk Assessment: Kyphotic  Communication   Communication: HOH  Cognition Arousal/Alertness: Awake/alert Behavior During Therapy: WFL for tasks assessed/performed Overall Cognitive Status: Within Functional Limits for tasks assessed                                        General Comments General comments (skin integrity, edema, etc.): Encouraged AROM and isometrics as able.  Educated on hip precautions and hip abduction brace. Granddaughter present    Exercises     Assessment/Plan    PT Assessment Patient needs continued PT services  PT Problem List Decreased strength;Decreased mobility;Decreased safety awareness;Decreased range of motion;Decreased knowledge of precautions;Decreased activity tolerance;Decreased balance;Decreased knowledge of use of DME;Pain       PT Treatment Interventions DME instruction;Therapeutic activities;Modalities;Therapeutic exercise;Gait training;Patient/family education;Balance training;Functional mobility training;Wheelchair mobility training    PT Goals (Current goals can be found in the Care Plan section)  Acute Rehab PT Goals Patient Stated  Goal: open to going to rehab PT Goal Formulation: With patient/family Time For Goal Achievement: 02/08/21 Potential to Achieve Goals: Good    Frequency Min 3X/week   Barriers to discharge        Co-evaluation               AM-PAC PT "6 Clicks" Mobility  Outcome Measure Help needed turning from your back to your side while in a flat bed without using bedrails?: Total Help needed moving from lying on your back to sitting on the side of a flat bed without using bedrails?: Total Help needed moving to and from a bed to a chair (including a wheelchair)?: Total Help needed standing up from a chair using your arms (e.g., wheelchair or bedside chair)?: Total Help needed to walk in hospital room?: Total Help needed climbing 3-5 steps with a railing? : Total 6 Click Score: 6    End of Session Equipment Utilized During Treatment: Gait belt;Other (comment) (hip abd brace (did check skin under brace -currently intact)) Activity Tolerance: Patient tolerated treatment well Patient left: in bed;with call bell/phone within reach;with bed alarm set;with SCD's reapplied;with family/visitor present (pillows under R hip to offload sacrum)  Nurse Communication: Mobility status PT Visit Diagnosis: Other abnormalities of gait and mobility (R26.89);Muscle weakness (generalized) (M62.81);History of falling (Z91.81)    Time: 8016-5537 PT Time Calculation (min) (ACUTE ONLY): 40 min   Charges:   PT Evaluation $PT Eval Moderate Complexity: 1 Mod PT Treatments $Therapeutic Activity: 23-37 mins        Abran Richard, PT Acute Rehab Services Pager [phone] [facility] [phone]    Karlton Lemon 01/25/2021, 11:29 AM

## 2021-01-25 NOTE — Progress Notes (Signed)
PROGRESS NOTE    Lori Rodriguez  WUJ:[id number] DOB: [birth year] DOA: 01/21/2021 PCP: Penelope Coop, FNP   Chief Complain: Transfer from [facility]  Brief Narrative: Patient is a 86 year old female with history of paroxysmal A. fib on anticoagulation with Xarelto, CKD status 3A, diabetes type 2, hypertension, hyperlipidemia, history of breast cancer, diastolic dysfunction who was sent from [facility] for the management of right nondisplaced fracture of her prosthetic hip as per the report she had a mechanical fall when she slipped over a rug.  Orthopedics did ORIF on 01/24/21.  Hospital course remarkable for A. fib with RVR, intermittent fever  Assessment & Plan:   Principal Problem:   Femur fracture ([place]) Active Problems:   Mixed hyperlipidemia   Long term (current) use of anticoagulants   Atrial fibrillation (HCC)   Chronic kidney disease   Diabetes mellitus without complication ([place])   Right femoral periprosthetic fracture: Transferred here from [facility] for orthopedic management.  Continue pain management, supportive care.  Status post ORIF on 01/24/2021. PT/OT consulted.  Recommendation was to proceed on discharge.  She also has right abductor brace was needs to be worn all the time.  A. fib with RVR: Takes labetalol for rate control, on Xarelto for anticoagulation.  She was on A. fib with RVR after admission here.  Started heparin drip for anticoagulation,now stopped.  Xarelto  restarted. Also had to be started on Cardizem drip for A. fib with RVR.  Monitor on telemetry.  Heart rate is well controlled today.  Will start on short acting oral Cardizem which will be transitioned  to long-acting  Diabetes type 2: Monitor blood sugars.  Continue sliding scale insulin and Lantus.  Recent hemoglobin A1c of 7.1.  Hyperlipidemia: Continue statin  CKD stage IIIa: Currently kidney function at baseline  Coronary artery disease: Denies any chest pain.   Currently stable.  Echocardiogram showed EF of 55 to 60%, indeterminate left ventricular diastolic parameter, mildly reduced right ventricular systolic function, moderately elevated pulmonary artery pressure .follows with Dr Bettina Gavia  History of breast cancer: We recommend oncology follow-up as an outpatient  Hypertension: Currently blood pressure stable.  Continue as needed medications for severe hypertension.  Leukocytosis/intermittent fever: Mild leukocytosis.  She has spiked mild grade fever intermittently  .  Urine was not suggestive  of UTI.  Sent blood culture, urine culture.  Urine showed multiple species patient has history of UTI in the past and she was having some dysuria.  Started on ceftriaxone 1 g daily.  Chest x-ray did not show any pneumonia. Will complete 3 days course of ceftriaxone.  History of depression: On Zoloft         DVT prophylaxis:xarelto Code Status: Full Family Communication: Granddaughter at bedside  status is: Inpatient  Remains inpatient appropriate because:Inpatient level of care appropriate due to severity of illness   Dispo: The patient is from: Home              Anticipated d/c is to: SNF              Patient currently is not medically stable to d/c.   Difficult to place patient No    Consultants: Ortho  Procedures:None  Antimicrobials:  Anti-infectives (From admission, onward)   Start     Dose/Rate Route Frequency Ordered Stop   01/24/21 1830  ceFAZolin (ANCEF) IVPB 2g/100 mL premix        2 g 200 mL/hr over 30 Minutes Intravenous Every 6 hours 01/24/21 1723  01/25/21 0032   01/24/21 1000  ceFAZolin (ANCEF) IVPB 2g/100 mL premix        2 g 200 mL/hr over 30 Minutes Intravenous On call to O.R. 01/24/21 0905 01/24/21 1144   01/23/21 1815  cefTRIAXone (ROCEPHIN) 1 g in sodium chloride 0.9 % 100 mL IVPB        1 g 200 mL/hr over 30 Minutes Intravenous Every 24 hours 01/23/21 1726        Subjective:  Patient seen and examined the  bedside this morning ,hemodynamically stable, comfortable, heart rate well controlled today.  Granddaughter at bedside.  Denies any complaints.   Objective: Vitals:   01/24/21 1645 01/24/21 1700 01/24/21 2027 01/25/21 0600  BP: 129/69 (!) 146/97 (!) 154/67   Pulse: 89 94 (!) 104 (!) 103  Resp: 18 20  20   Temp:  98 F (36.7 C) 98.4 F (36.9 C) 98.1 F (36.7 C)  TempSrc:   Oral Oral  SpO2: 99% 99% 94% 96%  Weight:      Height:        Intake/Output Summary (Last 24 hours) at 01/25/2021 0849 Last data filed at 01/25/2021 0300 Gross per 24 hour  Intake 2570.21 ml  Output 1300 ml  Net 1270.21 ml   Filed Weights   01/21/21 2000 01/21/21 2235  Weight: 81.2 kg 82.3 kg    Examination:   General exam: Overall comfortable, not in distress, pleasant elderly female HEENT: PERRL Respiratory system:  no wheezes or crackles  Cardiovascular system: Irregularly irregular rhythm Gastrointestinal system: Abdomen is nondistended, soft and nontender. Central nervous system: Alert and oriented Extremities: No edema, no clubbing ,no cyanosis, left abductor brace, wound VAC Skin: No rashes, no ulcers,no icterus    Data Reviewed: I have personally reviewed following labs and imaging studies  CBC: Recent Labs  Lab 01/21/21 2058 01/22/21 0328 01/23/21 0421 01/24/21 0344 01/25/21 0309  WBC 10.4 12.0* 11.6* 12.5* 14.7*  NEUTROABS  --   --  8.5*  --   --   HGB 12.7 12.7 12.4 11.1* 9.0*  HCT 38.9 39.4 37.7 34.7* 27.5*  MCV 90.3 89.5 88.7 91.1 90.5  PLT 157 164 146* 149* [id number]   Basic Metabolic Panel: Recent Labs  Lab 01/21/21 2058 01/22/21 0328 01/24/21 0344 01/25/21 0309  NA  --  135 133* 133*  K  --  4.0 4.0 4.1  CL  --  101 100 102  CO2  --  24 22 17*  GLUCOSE  --  178* 198* 215*  BUN  --  17 23 27*  CREATININE 0.74 0.62 0.72 0.56  CALCIUM  --  9.1 8.7* 8.7*   GFR: Estimated Creatinine Clearance: 58.8 mL/min (by C-G formula based on SCr of 0.56 mg/dL). Liver Function  Tests: Recent Labs  Lab 01/22/21 0328  AST 16  ALT 13  ALKPHOS 30*  BILITOT 0.8  PROT 6.9  ALBUMIN 3.8   No results for input(s): LIPASE, AMYLASE in the last 168 hours. No results for input(s): AMMONIA in the last 168 hours. Coagulation Profile: No results for input(s): INR, PROTIME in the last 168 hours. Cardiac Enzymes: No results for input(s): CKTOTAL, CKMB, CKMBINDEX, TROPONINI in the last 168 hours. BNP (last 3 results) Recent Labs    05/25/20 1521  PROBNP 587   HbA1C: No results for input(s): HGBA1C in the last 72 hours. CBG: Recent Labs  Lab 01/24/21 0751 01/24/21 1626 01/24/21 1724 01/24/21 2025 01/25/21 0759  GLUCAP 198* 189* 198* 228* 226*  Lipid Profile: No results for input(s): CHOL, HDL, LDLCALC, TRIG, CHOLHDL, LDLDIRECT in the last 72 hours. Thyroid Function Tests: No results for input(s): TSH, T4TOTAL, FREET4, T3FREE, THYROIDAB in the last 72 hours. Anemia Panel: No results for input(s): VITAMINB12, FOLATE, FERRITIN, TIBC, IRON, RETICCTPCT in the last 72 hours. Sepsis Labs: No results for input(s): PROCALCITON, LATICACIDVEN in the last 168 hours.  Recent Results (from the past 240 hour(s))  Surgical PCR screen     Status: None   Collection Time: 01/21/21 10:54 PM   Specimen: Nasal Mucosa; Nasal Swab  Result Value Ref Range Status   MRSA, PCR NEGATIVE NEGATIVE Final   Staphylococcus aureus NEGATIVE NEGATIVE Final    Comment: (NOTE) The Xpert SA Assay (FDA approved for NASAL specimens in patients 79 years of age and older), is one component of a comprehensive surveillance program. It is not intended to diagnose infection nor to guide or monitor treatment. Performed at [facility], [place] [address], [place], [place] [address]   Culture, blood (routine x 2)     Status: None (Preliminary result)   Collection Time: 01/23/21  8:44 AM   Specimen: BLOOD  Result Value Ref Range Status   Specimen Description   Final    BLOOD LEFT  ANTECUBITAL Performed at [facility] [address] [place], [place] [address]    Special Requests   Final    BOTTLES DRAWN AEROBIC AND ANAEROBIC Blood Culture results may not be optimal due to an excessive volume of blood received in culture bottles Performed at [facility] [address], [place], [place] [address]    Culture   Final    NO GROWTH 2 DAYS Performed at [facility] [address], [place], [place] [address]    Report Status PENDING  Incomplete  Culture, blood (routine x 2)     Status: None (Preliminary result)   Collection Time: 01/23/21  8:49 AM   Specimen: BLOOD  Result Value Ref Range Status   Specimen Description   Final    BLOOD BLOOD RIGHT HAND Performed at [facility] [address] [place], [place] [address]    Special Requests   Final    BOTTLES DRAWN AEROBIC AND ANAEROBIC Blood Culture adequate volume Performed at [facility] [address] [place], [place] [address]    Culture   Final    NO GROWTH 2 DAYS Performed at [facility] [address] [place],  [address]    Report Status PENDING  Incomplete  SARS Coronavirus 2 by RT PCR (hospital order, performed in [facility] hospital lab) Nasopharyngeal     Status: None   Collection Time: 01/23/21  1:26 PM   Specimen: Nasopharyngeal  Result Value Ref Range Status   SARS Coronavirus 2 NEGATIVE NEGATIVE Final    Comment: (NOTE) SARS-CoV-2 target nucleic acids are NOT DETECTED.  The SARS-CoV-2 RNA is generally detectable in upper and lower respiratory specimens during the acute phase of infection. The lowest concentration of SARS-CoV-2 viral copies this assay can detect is 250 copies / mL. A negative result does not preclude SARS-CoV-2 infection and should not be used as the sole basis for treatment or other patient management decisions.  A negative result may occur with improper specimen  collection / handling, submission of specimen other than nasopharyngeal swab, presence of viral mutation(s) within the areas targeted by this assay, and inadequate number of viral copies (<250 copies /  mL). A negative result must be combined with clinical observations, patient history, and epidemiological information.  Fact Sheet for Patients:   StrictlyIdeas.no  Fact Sheet for Healthcare Providers: BankingDealers.co.za  This test is not yet approved or  cleared by the [place] FDA and has been authorized for detection and/or diagnosis of SARS-CoV-2 by FDA under an Emergency Use Authorization (EUA).  This EUA will remain in effect (meaning this test can be used) for the duration of the COVID-19 declaration under Section 564(b)(1) of the Act, 21 U.S.C. section 360bbb-3(b)(1), unless the authorization is terminated or revoked sooner.  Performed at [facility], [place] [address], [place], [place] [address]   Culture, Urine     Status: Abnormal   Collection Time: 01/23/21  1:26 PM   Specimen: Urine, Random  Result Value Ref Range Status   Specimen Description   Final    URINE, RANDOM Performed at [facility] [address] [place], [place] [address]    Special Requests   Final    NONE Performed at [facility], [place] [address] [place], [place] [address]    Culture MULTIPLE SPECIES PRESENT, SUGGEST RECOLLECTION (A)  Final   Report Status 01/25/2021 FINAL  Final         Radiology Studies: DG CHEST PORT 1 VIEW  Result Date: 01/23/2021 CLINICAL DATA:  Fever EXAM: PORTABLE CHEST 1 VIEW COMPARISON:  01/19/2021 FINDINGS: Lungs are clear. Chin overlies the lung apices, however, no definite pneumothorax identified. No pleural effusion. Mild cardiomegaly is stable. Hilar enlargement is in keeping with pulmonary arterial enlargement centrally, better appreciated on a CT  examination of 02/21/2020 in keeping with changes of pulmonary arterial hypertension. Pulmonary vascularity is otherwise normal. No acute bone abnormality. IMPRESSION: Stable cardiomegaly and central pulmonary arterial enlargement. Electronically Signed   By: Fidela Salisbury MD   On: 01/23/2021 20:33   DG C-Arm 1-60 Min-No Report  Result Date: 01/24/2021 Fluoroscopy was utilized by the requesting physician.  No radiographic interpretation.   ECHOCARDIOGRAM COMPLETE  Result Date: 01/23/2021    ECHOCARDIOGRAM REPORT   Patient Name:   Evoleth MARIE [facility] Date of Exam: 01/23/2021 Medical Rec #:  [id number]                Height:       67.0 in Accession #:    [id number]               Weight:       181.4 lb Date of Birth:  1935-04-03                BSA:          1.940 m Patient Age:    58 years                 BP:           152/73 mmHg Patient Gender: F                        HR:           88 bpm. Exam Location:  Inpatient Procedure: 2D Echo, Cardiac Doppler and Color Doppler Indications:    Pre-op cardiac evaluation  History:        Patient has prior history of Echocardiogram examinations, most                 recent 05/14/2020. CHF, Pulmonary HTN, Arrythmias:Atrial  Fibrillation; Risk Factors:Diabetes, Hypertension and                 Dyslipidemia.  Sonographer:    Luisa Hart RDCS Referring Phys: [place]  1. Left ventricular ejection fraction, by estimation, is 55 to 60%. The left ventricle has normal function. The left ventricle has no regional wall motion abnormalities. Left ventricular diastolic parameters are indeterminate.  2. Right ventricular systolic function is mildly reduced. The right ventricular size is mildly enlarged. There is moderately elevated pulmonary artery systolic pressure.  3. Left atrial size was moderately dilated.  4. Right atrial size was moderately dilated.  5. The mitral valve is degenerative. Trivial mitral valve regurgitation. No evidence of  mitral stenosis.  6. The aortic valve was not well visualized. There is mild calcification of the aortic valve. Aortic valve regurgitation is not visualized. Mild aortic valve sclerosis is present, with no evidence of aortic valve stenosis.  7. The inferior vena cava is normal in size with greater than 50% respiratory variability, suggesting right atrial pressure of 3 mmHg. FINDINGS  Left Ventricle: Left ventricular ejection fraction, by estimation, is 55 to 60%. The left ventricle has normal function. The left ventricle has no regional wall motion abnormalities. The left ventricular internal cavity size was normal in size. There is  no left ventricular hypertrophy. Left ventricular diastolic parameters are indeterminate. Right Ventricle: The right ventricular size is mildly enlarged. Right vetricular wall thickness was not assessed. Right ventricular systolic function is mildly reduced. There is moderately elevated pulmonary artery systolic pressure. The tricuspid regurgitant velocity is 3.31 m/s, and with an assumed right atrial pressure of 3 mmHg, the estimated right ventricular systolic pressure is 86.5 mmHg. Left Atrium: Left atrial size was moderately dilated. Right Atrium: Right atrial size was moderately dilated. Pericardium: There is no evidence of pericardial effusion. Mitral Valve: The mitral valve is degenerative in appearance. There is mild thickening of the mitral valve leaflet(s). There is mild calcification of the mitral valve leaflet(s). Mild mitral annular calcification. Trivial mitral valve regurgitation. No evidence of mitral valve stenosis. Tricuspid Valve: The tricuspid valve is normal in structure. Tricuspid valve regurgitation is mild . No evidence of tricuspid stenosis. Aortic Valve: The aortic valve was not well visualized. There is mild calcification of the aortic valve. Aortic valve regurgitation is not visualized. Mild aortic valve sclerosis is present, with no evidence of aortic valve  stenosis. Aortic valve mean gradient measures 3.5 mmHg. Aortic valve peak gradient measures 7.1 mmHg. Aortic valve area, by VTI measures 2.44 cm. Pulmonic Valve: The pulmonic valve was normal in structure. Pulmonic valve regurgitation is not visualized. No evidence of pulmonic stenosis. Aorta: The aortic root is normal in size and structure. Venous: The inferior vena cava is normal in size with greater than 50% respiratory variability, suggesting right atrial pressure of 3 mmHg. IAS/Shunts: No atrial level shunt detected by color flow Doppler.  LEFT VENTRICLE PLAX 2D LVIDd:         5.30 cm     Diastology LVIDs:         3.70 cm     LV e' medial:    5.00 cm/s LV PW:         1.20 cm     LV E/e' medial:  31.4 LV IVS:        0.90 cm     LV e' lateral:   6.09 cm/s LVOT diam:     1.90 cm  LV E/e' lateral: 25.8 LV SV:         51 LV SV Index:   26 LVOT Area:     2.84 cm  LV Volumes (MOD) LV vol d, MOD A2C: 39.6 ml LV vol d, MOD A4C: 54.8 ml LV vol s, MOD A2C: 18.5 ml LV vol s, MOD A4C: 20.4 ml LV SV MOD A2C:     21.1 ml LV SV MOD A4C:     54.8 ml LV SV MOD BP:      27.9 ml RIGHT VENTRICLE RV S prime:     8.92 cm/s TAPSE (M-mode): 1.2 cm LEFT ATRIUM             Index       RIGHT ATRIUM           Index LA diam:        4.60 cm 2.37 cm/m  RA Area:     24.00 cm LA Vol (A2C):   47.3 ml 24.38 ml/m RA Volume:   67.20 ml  34.63 ml/m LA Vol (A4C):   50.7 ml 26.13 ml/m LA Biplane Vol: 50.4 ml 25.98 ml/m  AORTIC VALVE                   PULMONIC VALVE AV Area (Vmax):    2.29 cm    PV Vmax:       1.03 m/s AV Area (Vmean):   2.34 cm    PV Vmean:      79.000 cm/s AV Area (VTI):     2.44 cm    PV VTI:        0.189 m AV Vmax:           133.50 cm/s PV Peak grad:  4.2 mmHg AV Vmean:          92.100 cm/s PV Mean grad:  3.0 mmHg AV VTI:            0.209 m AV Peak Grad:      7.1 mmHg AV Mean Grad:      3.5 mmHg LVOT Vmax:         108.00 cm/s LVOT Vmean:        75.900 cm/s LVOT VTI:          0.180 m LVOT/AV VTI ratio: 0.86  AORTA Ao  Root diam: 3.30 cm Ao Asc diam:  3.00 cm MITRAL VALVE                TRICUSPID VALVE MV Area (PHT): 5.02 cm     TR Peak grad:   43.8 mmHg MV Decel Time: 151 msec     TR Vmax:        331.00 cm/s MR Peak grad: 79.9 mmHg MR Vmax:      447.00 cm/s   SHUNTS MV E velocity: 157.00 cm/s  Systemic VTI:  0.18 m                             Systemic Diam: 1.90 cm Charlton Haws MD Electronically signed by Charlton Haws MD Signature Date/Time: 01/23/2021/2:20:48 PM    Final    DG HIP PORT UNILAT WITH PELVIS 1V RIGHT  Result Date: 01/24/2021 CLINICAL DATA:  Right hip arthroplasty revision EXAM: DG HIP (WITH OR WITHOUT PELVIS) 1V PORT RIGHT COMPARISON:  01/19/2021 FINDINGS: Frontal view of the pelvis as well as a frontal view of the right hip are obtained. There has been revision  of the femoral component of the right hip arthroplasty, with long stem component and cerclage wires now traversing the proximal femoral subtrochanteric fracture seen previously. Alignment is near anatomic. Postsurgical changes are seen in the soft tissues. IMPRESSION: 1. Revision of the right hip arthroplasty as above, with femoral component now traversing the subtrochanteric femoral fracture seen previously. Near anatomic alignment. Electronically Signed   By: Randa Ngo M.D.   On: 01/24/2021 16:06   DG HIP OPERATIVE UNILAT W OR W/O PELVIS RIGHT  Result Date: 01/24/2021 CLINICAL DATA:  Total right hip revision. EXAM: OPERATIVE right HIP (WITH PELVIS IF PERFORMED)  VIEWS TECHNIQUE: Fluoroscopic spot image(s) were submitted for interpretation post-operatively. COMPARISON:  Jan 22, 2021. FINDINGS: A single C-arm fluoroscopic images less intraoperatively and submitted for post operative interpretation. This image demonstrates hip arthroplasty with cerclage wires. The superior most and inferior most aspect of the hardware is not imaged. Alignment at the hip is difficult to evaluate on this single image. Please see the performing provider's procedural  report for further detail. IMPRESSION: Intraoperative fluoroscopy, as detailed above. Electronically Signed   By: Margaretha Sheffield MD   On: 01/24/2021 14:35        Scheduled Meds: . Chlorhexidine Gluconate Cloth  6 each Topical Daily  . docusate sodium  100 mg Oral BID  . insulin aspart  0-15 Units Subcutaneous TID WC  . insulin aspart  0-5 Units Subcutaneous QHS  . insulin glargine  20 Units Subcutaneous Daily  . labetalol  100 mg Oral BID  . rivaroxaban  10 mg Oral Daily   Followed by  . [START ON 01/28/2021] rivaroxaban  20 mg Oral Q supper  . senna  1 tablet Oral BID  . sertraline  100 mg Oral Daily   Continuous Infusions: . sodium chloride 75 mL/hr at 01/24/21 2021  . cefTRIAXone (ROCEPHIN)  IV 1 g (01/24/21 1843)     LOS: 4 days    Time spent: 35 mins.More than 50% of that time was spent in counseling and/or coordination of care.      Shelly Coss, MD [facility] P5/02/2021, 8:49 AM

## 2021-01-25 NOTE — Progress Notes (Addendum)
    Subjective:  Patient reports pain as mild to moderate.  Denies N/V/CP/SOB.  Objective:   VITALS:   Vitals:   01/24/21 1645 01/24/21 1700 01/24/21 2027 01/25/21 0600  BP: 129/69 (!) 146/97 (!) 154/67   Pulse: 89 94 (!) 104 (!) 103  Resp: 18 20  20   Temp:  98 F (36.7 C) 98.4 F (36.9 C) 98.1 F (36.7 C)  TempSrc:   Oral Oral  SpO2: 99% 99% 94% 96%  Weight:      Height:        NAD ABD soft Neurovascular intact Sensation intact distally Intact pulses distally Dorsiflexion/Plantar flexion intact Incision: Incisional dressing in place House vac connected  Lab Results  Component Value Date   WBC 14.7 (H) 01/25/2021   HGB 9.0 (L) 01/25/2021   HCT 27.5 (L) 01/25/2021   MCV 90.5 01/25/2021   PLT 156 01/25/2021   BMET    Component Value Date/Time   NA 133 (L) 01/25/2021 0309   NA 136 07/12/2020 1536   K 4.1 01/25/2021 0309   CL 102 01/25/2021 0309   CO2 17 (L) 01/25/2021 0309   GLUCOSE 215 (H) 01/25/2021 0309   BUN 27 (H) 01/25/2021 0309   BUN 16 07/12/2020 1536   CREATININE 0.56 01/25/2021 0309   CALCIUM 8.7 (L) 01/25/2021 0309   GFRNONAA >60 01/25/2021 0309   GFRAA 88 07/12/2020 1536     Assessment/Plan: 1 Day Post-Op   Principal Problem:   Femur fracture (HCC) Active Problems:   Mixed hyperlipidemia   Long term (current) use of anticoagulants   Atrial fibrillation (HCC)   Chronic kidney disease   Diabetes mellitus without complication (HCC)   TDWB RLE with walker DVT ppx: Xarelto, SCDs, TEDS    Resume full home Xarelto dose 48-72hrs post op PO pain control PT/OT ABLA: Asymptomatic Patient should wear abductor brace at all times once received. Abductor wedge may be discontinued at that time House vac should be disconnected and portable Prevena connected at discharge Dispo: D/C [place] 01/25/2021, 10:41 AM   Kathryn is now [employer] [address] [place], [place], [place]  [address] Phone: [phone] [email or web address]  [employer]

## 2021-01-25 NOTE — TOC Initial Note (Signed)
Transition of Care [facility]) - Initial/Assessment Note    Patient Details  Name: Lori Rodriguez MRN: [id number] Date of Birth: 1935-01-12  [facility] CM/SW Contact:    Lori Phi, RN Phone Number: 01/25/2021, 5:28 PM  Clinical Narrative: Spoke to patient/dtr Lori Rodriguez-both agree to SNF per PT recc-has gone to [facility]-prefer to go there again.Not vaccinated. Await bed offers.                  Expected Discharge Plan: Skilled Nursing Facility Barriers to Discharge: Continued Medical Work up   Patient Goals and CMS Choice Patient states their goals for this hospitalization and ongoing recovery are:: go to rehab CMS Medicare.gov Compare Post Acute Care list provided to:: Patient Represenative (must comment) Choice offered to / list presented to : Adult Children Lori Rodriguez dtr [phone])  Expected Discharge Plan and Services Expected Discharge Plan: [place]   Discharge Planning Services: CM Consult Post Acute Care Choice: [place] Living arrangements for the past 2 months: Single Family Home                                      Prior Living Arrangements/Services Living arrangements for the past 2 months: Single Family Home Lives with:: Adult Children Patient language and need for interpreter reviewed:: Yes Do you feel safe going back to the place where you live?: Yes      Need for Family Participation in Patient Care: No (Comment) Care giver support system in place?: Yes (comment) Current home services: DME Kasandra Knudsen) Criminal Activity/Legal Involvement Pertinent to Current Situation/Hospitalization: No - Comment as needed  Activities of Daily Living Home Assistive Devices/Equipment: Cane (specify quad or straight),Dentures (specify type),Eyeglasses,Walker (specify type) ADL Screening (condition at time of admission) Patient's cognitive ability adequate to safely complete daily activities?: Yes Is the patient  deaf or have difficulty hearing?: No Does the patient have difficulty seeing, even when wearing glasses/contacts?: No Does the patient have difficulty concentrating, remembering, or making decisions?: No Patient able to express need for assistance with ADLs?: Yes Does the patient have difficulty dressing or bathing?: No Independently performs ADLs?: Yes (appropriate for developmental age) (prior to admission, yes) Does the patient have difficulty walking or climbing stairs?: Yes (currently yes due to fractured right femur) Weakness of Legs: Right Weakness of Arms/Hands: None  Permission Sought/Granted Permission sought to share information with : Case Manager Permission granted to share information with : Yes, Verbal Permission Granted  Share Information with NAME: Case Manager     Permission granted to share info w Relationship: Lori Rodriguez dtr [phone]     Emotional Assessment Appearance:: Appears stated age Attitude/Demeanor/Rapport: Gracious Affect (typically observed): Accepting Orientation: : Oriented to Self,Oriented to Place,Oriented to  Time,Oriented to Situation Alcohol / Substance Use: Not Applicable Psych Involvement: No (comment)  Admission diagnosis:  Femur fracture ([place]) [S72.90XA] Patient Active Problem List   Diagnosis Date Noted  . Femur fracture ([place]) 01/21/2021  . Femur fracture ([place]) 01/21/2021  . Anemia   . Anginal pain ([place])   . Atrial fibrillation ([place])   . Cancer ([place])   . CHF (congestive heart failure) ([place])   . Chronic back pain   . Chronic kidney disease   . Diabetes mellitus without complication ([place])   . DJD (degenerative joint disease)   . Dysrhythmia   . Hypertension   . Osteoporosis   .  Right renal mass   . Vitamin B12 deficiency   . Vitamin D deficiency   . Orthostatic hypotension 12/01/2018  . Secondary pulmonary arterial hypertension ([place]) 11/03/2018  . Estrogen receptor positive neoplasm 03/10/2016  . Cancer of central  portion of left female breast ([place]) 03/10/2016  . Type 2 diabetes mellitus ([place]) 02/29/2016  . Mixed hyperlipidemia 02/29/2016  . Long term (current) use of anticoagulants 02/29/2016  . Hypertensive heart disease 02/28/2016  . Persistent atrial fibrillation ([place]) 02/28/2016  . Encounter for preprocedural cardiovascular examination 02/28/2016   PCP:  Lori Coop, FNP Pharmacy:   [facility] [place] [address] Phone: [phone] Fax: [phone]     Social Determinants of Health (SDOH) Interventions    Readmission Risk Interventions No flowsheet data found.

## 2021-01-25 NOTE — Progress Notes (Signed)
Contacted MD Adhikari regarding removal of foley, as today is POD 1. Advised to keep foley in place for now.

## 2021-01-25 NOTE — NC FL2 (Signed)
[place] LEVEL OF CARE SCREENING TOOL     IDENTIFICATION  Patient Name: Lori Rodriguez Birthdate: [birth year] Sex: female Admission Date (Current Location): 01/21/2021  [facility] and [place] Number:  Herbalist and Address:  [facility],  [place] [address], [place]      Provider Number: [id number]  Attending Physician Name and Address:  Shelly Coss, MD  Relative Name and Phone Number:  Eulalie Speights) [phone]    Current Level of Care: Hospital Recommended Level of Care: [place] Prior Approval Number:    Date Approved/Denied:   PASRR Number: [id number]  Discharge Plan: SNF    Current Diagnoses: Patient Active Problem List   Diagnosis Date Noted  . Femur fracture ([place]) 01/21/2021  . Femur fracture ([place]) 01/21/2021  . Anemia   . Anginal pain ([place])   . Atrial fibrillation ([place])   . Cancer ([place])   . CHF (congestive heart failure) ([place])   . Chronic back pain   . Chronic kidney disease   . Diabetes mellitus without complication ([place])   . DJD (degenerative joint disease)   . Dysrhythmia   . Hypertension   . Osteoporosis   . Right renal mass   . Vitamin B12 deficiency   . Vitamin D deficiency   . Orthostatic hypotension 12/01/2018  . Secondary pulmonary arterial hypertension ([place]) 11/03/2018  . Estrogen receptor positive neoplasm 03/10/2016  . Cancer of central portion of left female breast ([place]) 03/10/2016  . Type 2 diabetes mellitus ([place]) 02/29/2016  . Mixed hyperlipidemia 02/29/2016  . Long term (current) use of anticoagulants 02/29/2016  . Hypertensive heart disease 02/28/2016  . Persistent atrial fibrillation ([place]) 02/28/2016  . Encounter for preprocedural cardiovascular examination 02/28/2016    Orientation RESPIRATION BLADDER Height & Weight     Self,Time,Situation,Place  Normal Indwelling catheter Weight: 82.3 kg Height:  5\' 7"  (170.2 cm)  BEHAVIORAL SYMPTOMS/MOOD  NEUROLOGICAL BOWEL NUTRITION STATUS      Continent Diet (CHO MOD)  AMBULATORY STATUS COMMUNICATION OF NEEDS Skin   Limited Assist Verbally Surgical wounds (R hip surgical incision site)                       Personal Care Assistance Level of Assistance  Bathing,Feeding,Dressing Bathing Assistance: Limited assistance Feeding assistance: Limited assistance Dressing Assistance: Limited assistance     Functional Limitations Info  Sight,Speech,Hearing Sight Info: Impaired (eyeglasses) Hearing Info: Adequate Speech Info: Impaired (Dentures-top/bottom)    SPECIAL CARE FACTORS FREQUENCY  PT (By licensed PT),OT (By licensed OT)     PT Frequency:  (5x week) OT Frequency:  (5x week)            Contractures Contractures Info: Not present    Additional Factors Info  Code Status,Allergies,Psychotropic Code Status Info:  (Full)   Psychotropic Info:  (Lisinopril, Percocet (Oxycodone-acetaminophen), Valium (Diazepam), Prednisone)         Current Medications (01/25/2021):  This is the current hospital active medication list Current Facility-Administered Medications  Medication Dose Route Frequency Provider Last Rate Last Admin  . acetaminophen (TYLENOL) tablet 650 mg  650 mg Oral Q6H PRN Rod Can, MD   650 mg at 01/23/21 1730   Or  . acetaminophen (TYLENOL) suppository 650 mg  650 mg Rectal Q6H PRN Swinteck, Aaron Edelman, MD      . acetaminophen (TYLENOL) tablet 325-650 mg  325-650 mg Oral Q6H PRN Rod Can, MD      . cefTRIAXone (ROCEPHIN)  1 g in sodium chloride 0.9 % 100 mL IVPB  1 g Intravenous Q24H Shelly Coss, MD 200 mL/hr at 01/24/21 1843 1 g at 01/24/21 1843  . Chlorhexidine Gluconate Cloth 2 % PADS 6 each  6 each Topical Daily Shelly Coss, MD   6 each at 01/25/21 1000  . diltiazem (CARDIZEM) tablet 60 mg  60 mg Oral Q8H Adhikari, Amrit, MD   60 mg at 01/25/21 1341  . docusate sodium (COLACE) capsule 100 mg  100 mg Oral BID Rod Can, MD   100 mg at  01/25/21 0854  . HYDROcodone-acetaminophen (NORCO) 7.5-325 MG per tablet 1-2 tablet  1-2 tablet Oral Q4H PRN Rod Can, MD   1 tablet at 01/25/21 0224  . HYDROcodone-acetaminophen (NORCO/VICODIN) 5-325 MG per tablet 1-2 tablet  1-2 tablet Oral Q4H PRN Rod Can, MD   1 tablet at 01/25/21 1426  . insulin aspart (novoLOG) injection 0-15 Units  0-15 Units Subcutaneous TID WC Rod Can, MD   8 Units at 01/25/21 1341  . insulin aspart (novoLOG) injection 0-5 Units  0-5 Units Subcutaneous QHS Rod Can, MD   2 Units at 01/24/21 2101  . insulin glargine (LANTUS) injection 20 Units  20 Units Subcutaneous Daily Rod Can, MD   20 Units at 01/25/21 0855  . labetalol (NORMODYNE) tablet 100 mg  100 mg Oral BID Rod Can, MD   100 mg at 01/25/21 0853  . menthol-cetylpyridinium (CEPACOL) lozenge 3 mg  1 lozenge Oral PRN Swinteck, Aaron Edelman, MD       Or  . phenol (CHLORASEPTIC) mouth spray 1 spray  1 spray Mouth/Throat PRN Swinteck, Aaron Edelman, MD      . metoCLOPramide (REGLAN) tablet 5-10 mg  5-10 mg Oral Q8H PRN Swinteck, Aaron Edelman, MD       Or  . metoCLOPramide (REGLAN) injection 5-10 mg  5-10 mg Intravenous Q8H PRN Swinteck, Aaron Edelman, MD      . metoprolol tartrate (LOPRESSOR) injection 5 mg  5 mg Intravenous Q6H PRN Rod Can, MD   5 mg at 01/23/21 1700  . morphine 4 MG/ML injection 0.52-1 mg  0.52-1 mg Intravenous Q2H PRN Rod Can, MD   1 mg at 01/25/21 0751  . ondansetron (ZOFRAN) tablet 4 mg  4 mg Oral Q6H PRN Swinteck, Aaron Edelman, MD       Or  . ondansetron (ZOFRAN) injection 4 mg  4 mg Intravenous Q6H PRN Rod Can, MD   4 mg at 01/25/21 1426  . rivaroxaban (XARELTO) tablet 10 mg  10 mg Oral Daily Rod Can, MD   10 mg at 01/25/21 0854   Followed by  . [START ON 01/28/2021] rivaroxaban (XARELTO) tablet 20 mg  20 mg Oral Q supper Swinteck, Aaron Edelman, MD      . senna [facility]) tablet 8.6 mg  1 tablet Oral BID Rod Can, MD   8.6 mg at 01/25/21 0854  . sertraline  (ZOLOFT) tablet 100 mg  100 mg Oral Daily Rod Can, MD   100 mg at 01/25/21 2595     Discharge Medications: Please see discharge summary for a list of discharge medications.  Relevant Imaging Results:  Relevant Lab Results:   Additional Information SS#[id number]  Marit Goodwill, Juliann Pulse, RN

## 2021-01-25 NOTE — Plan of Care (Signed)
  Problem: Education: Goal: Knowledge of General Education information will improve Description Including pain rating scale, medication(s)/side effects and non-pharmacologic comfort measures Outcome: Progressing   Problem: Clinical Measurements: Goal: Ability to maintain clinical measurements within normal limits will improve Outcome: Progressing Goal: Will remain free from infection Outcome: Progressing Goal: Cardiovascular complication will be avoided Outcome: Progressing   Problem: Coping: Goal: Level of anxiety will decrease Outcome: Progressing   Problem: Safety: Goal: Ability to remain free from injury will improve Outcome: Progressing   

## 2021-01-26 LAB — CBC WITH DIFFERENTIAL/PLATELET
Abs Immature Granulocytes: 0.13 10*3/uL — ABNORMAL HIGH (ref 0.00–0.07)
Basophils Absolute: 0 10*3/uL (ref 0.0–0.1)
Basophils Relative: 0 %
Eosinophils Absolute: 0 10*3/uL (ref 0.0–0.5)
Eosinophils Relative: 0 %
HCT: 26.5 % — ABNORMAL LOW (ref 36.0–46.0)
Hemoglobin: 8.5 g/dL — ABNORMAL LOW (ref 12.0–15.0)
Immature Granulocytes: 1 %
Lymphocytes Relative: 6 %
Lymphs Abs: 0.9 10*3/uL (ref 0.7–4.0)
MCH: 28.6 pg (ref 26.0–34.0)
MCHC: 32.1 g/dL (ref 30.0–36.0)
MCV: 89.2 fL (ref 80.0–100.0)
Monocytes Absolute: 2 10*3/uL — ABNORMAL HIGH (ref 0.1–1.0)
Monocytes Relative: 15 %
Neutro Abs: 10.8 10*3/uL — ABNORMAL HIGH (ref 1.7–7.7)
Neutrophils Relative %: 78 %
Platelets: 165 10*3/uL (ref 150–400)
RBC: 2.97 MIL/uL — ABNORMAL LOW (ref 3.87–5.11)
RDW: 13.3 % (ref 11.5–15.5)
WBC: 13.8 10*3/uL — ABNORMAL HIGH (ref 4.0–10.5)
nRBC: 0 % (ref 0.0–0.2)

## 2021-01-26 LAB — BASIC METABOLIC PANEL
Anion gap: 11 (ref 5–15)
BUN: 36 mg/dL — ABNORMAL HIGH (ref 8–23)
CO2: 21 mmol/L — ABNORMAL LOW (ref 22–32)
Calcium: 8.8 mg/dL — ABNORMAL LOW (ref 8.9–10.3)
Chloride: 101 mmol/L (ref 98–111)
Creatinine, Ser: 1.03 mg/dL — ABNORMAL HIGH (ref 0.44–1.00)
GFR, Estimated: 54 mL/min — ABNORMAL LOW (ref >60)
Glucose, Bld: 180 mg/dL — ABNORMAL HIGH (ref 70–99)
Potassium: 4.1 mmol/L (ref 3.5–5.1)
Sodium: 133 mmol/L — ABNORMAL LOW (ref 135–145)

## 2021-01-26 LAB — GLUCOSE, CAPILLARY
Glucose-Capillary: 166 mg/dL — ABNORMAL HIGH (ref 70–99)
Glucose-Capillary: 271 mg/dL — ABNORMAL HIGH (ref 70–99)
Glucose-Capillary: 231 mg/dL — ABNORMAL HIGH (ref 70–99)
Glucose-Capillary: 249 mg/dL — ABNORMAL HIGH (ref 70–99)

## 2021-01-26 MED ORDER — DILTIAZEM HCL ER COATED BEADS 240 MG PO CP24
240.0000 mg | ORAL_CAPSULE | Freq: Every day | ORAL | Status: DC
  Administered 2021-01-26 – 2021-01-28 (×3): 240 mg via ORAL
  Filled 2021-01-26 (×3): qty 1

## 2021-01-26 NOTE — Evaluation (Signed)
Occupational Therapy Evaluation Patient Details Name: Lori Rodriguez MRN: [id number] DOB: Sep 16, 1935 Today's Date: 01/26/2021    History of Present Illness Pt is 86 yo female admitted s/p fall with R nondisplaced fx of prosthetic hip.  She initially went to [facility] and transferred to [facility] due to need for complext surgery.  Pt reports initial fall on 01/19/21.  Pt had revision of R THA on 01/24/21.  She has PMH of Atrial fibrillation, chronic kidney disease stage III, diabetes, hypertension, hyperlipidemia, history of breast cancer on the left   Clinical Impression   Lori Rodriguez is an 86 year old woman who presents s/p revision of right THA after fall and now presents with posterior hip precautions, TDWB and hip abductor brace. On evaluation patient exhibits generalized weakness in upper extremities, decreased ROM and strength of RLE (ankle flexion only 3/5 - she could not resist and reports her  ROM is better than yesterday. Able to extend knee at side of bed but also not able to maintain position against resistance and reports decreased light touch on the right), decreased activity tolerance, impaired balance and reports of pain. Patient is also limited by weight bearing status and hip precautions. Patient requires max -total assistance for bed mobility and transfers and max-total assistance for LB ADLs. Patient will benefit from skilled OT services while in hospital to improve deficits and learn compensatory strategies as needed in order to improve functional abilities. Recommend short term rehab at discharge.      Follow Up Recommendations  SNF    Equipment Recommendations  None recommended by OT    Recommendations for Other Services       Precautions / Restrictions Precautions Precautions: Fall;Posterior Hip Precaution Booklet Issued: No Precaution Comments: Educated on precautions Required Braces or Orthoses: Other Brace Other Brace: Hip Abduction brace  at all times except for hygiene. Restrictions Weight Bearing Restrictions: Yes RLE Weight Bearing: Partial weight bearing RLE Partial Weight Bearing Percentage or Pounds: 30      Mobility Bed Mobility Overal bed mobility: Needs Assistance Bed Mobility: Supine to Sit     Supine to sit: +2 for physical assistance;Mod assist     General bed mobility comments: Mod assist x 2 to transfer to side of bed. Patient limited by pain and poor tolerance for brace (reports it feels like it is cutting in to her)    Transfers Overall transfer level: Needs assistance Equipment used: None Transfers: Sit to/from [facility] Sit to Stand: Total assist;+2 physical assistance;+2 safety/equipment;From elevated surface Stand pivot transfers: +2 physical assistance;+2 safety/equipment;Max assist       General transfer comment: Patient stood with +2 max assistance - bearing weight through her RLE to pivot to recliner. Patient complaining of pain.    Balance Overall balance assessment: Needs assistance Sitting-balance support: Bilateral upper extremity supported Sitting balance-Leahy Scale: Fair Sitting balance - Comments: Bil UE support and leaning L but more for pain control and offloading R hip rather than balance     Standing balance-Leahy Scale: Poor Standing balance comment: requiring external assistance                           ADL either performed or assessed with clinical judgement   ADL Overall ADL's : Needs assistance/impaired Eating/Feeding: Independent   Grooming: Set up;Sitting   Upper Body Bathing: Set up;Sitting   Lower Body Bathing: Maximal assistance;Sitting/lateral leans   Upper Body Dressing : Set  up;Sitting   Lower Body Dressing: Total assistance;Sit to/from stand;+2 for physical assistance;+2 for safety/equipment   Toilet Transfer: +2 for safety/equipment;+2 for physical assistance;Total assistance;BSC;Stand-pivot   Toileting- Clothing  Manipulation and Hygiene: Total assistance;Sit to/from stand               Vision Patient Visual Report: No change from baseline       Perception     Praxis      Pertinent Vitals/Pain Pain Assessment: Faces Faces Pain Scale: Hurts even more Pain Location: RLE Pain Descriptors / Indicators: Grimacing;Guarding;Aching Pain Intervention(s): Limited activity within patient's tolerance;Monitored during session;RN gave pain meds during session     Hand Dominance     Extremity/Trunk Assessment Upper Extremity Assessment RUE Deficits / Details: ROM WFL; MMT 4-/5 throughout - overall generally weak LUE Deficits / Details: ROM WFL; MMT 4-/5 throughout - overall generally weak   Lower Extremity Assessment RLE Deficits / Details: Patient reports she is able to dorsiflex her ankle better today than yesterday. Grossly functional ankle ROM but 3+.5 with resistance. At side of bed patient able to extend knee but exhibited no ability to resist. Reports sensation more dull on right than left. RLE Sensation: decreased light touch   Cervical / Trunk Assessment Cervical / Trunk Assessment: Kyphotic   Communication Communication Communication: HOH   Cognition Arousal/Alertness: Awake/alert Behavior During Therapy: WFL for tasks assessed/performed Overall Cognitive Status: Within Functional Limits for tasks assessed                                     General Comments       Exercises     Shoulder Instructions      Home Living Family/patient expects to be discharged to:: Skilled nursing facility Living Arrangements: Children Available Help at Discharge: Family;Available PRN/intermittently (lives with dtr who works) Type of Home: House Home Access: Stairs to enter Technical brewer of Steps: 1   Home Layout: One level     Bathroom Shower/Tub: Tub/shower unit ("old timey claw foot" so takes sponge baths)   Bathroom Toilet: Standard     Home Equipment:  Toilet riser;Bedside commode;Walker - 2 wheels          Prior Functioning/Environment Level of Independence: Needs assistance  Gait / Transfers Assistance Needed: Can ambulate in community -uses RW or grocery cart ADL's / Homemaking Assistance Needed: Reports independent with ADLs, does sponge baths; her and daughter share in IADLs - she can cook a meal            OT Problem List: Decreased strength;Decreased range of motion;Decreased activity tolerance;Impaired balance (sitting and/or standing);Decreased knowledge of use of DME or AE;Decreased knowledge of precautions;Pain;Cardiopulmonary status limiting activity      OT Treatment/Interventions: Self-care/ADL training;Therapeutic exercise;DME and/or AE instruction;Patient/family education;Balance training;Therapeutic activities    OT Goals(Current goals can be found in the care plan section) Acute Rehab OT Goals Patient Stated Goal: return to independence OT Goal Formulation: With patient Time For Goal Achievement: 02/09/21 Potential to Achieve Goals: Good  OT Frequency: Min 2X/week   Barriers to D/C:            Co-evaluation              AM-PAC OT "6 Clicks" Daily Activity     Outcome Measure Help from another person eating meals?: None Help from another person taking care of personal grooming?: A Little Help from another person toileting,  which includes using toliet, bedpan, or urinal?: Total Help from another person bathing (including washing, rinsing, drying)?: A Lot Help from another person to put on and taking off regular upper body clothing?: A Little Help from another person to put on and taking off regular lower body clothing?: Total 6 Click Score: 14   End of Session Nurse Communication: Mobility status  Activity Tolerance: Patient limited by pain Patient left: in chair;with call bell/phone within reach;with chair alarm set  OT Visit Diagnosis: Unsteadiness on feet (R26.81);Other abnormalities of gait  and mobility (R26.89);Pain;Muscle weakness (generalized) (M62.81) Pain - Right/Left: Right Pain - part of body: Hip;Leg                Time: 1884-1660 OT Time Calculation (min): 23 min Charges:  OT General Charges $OT Visit: 1 Visit OT Evaluation $OT Eval Moderate Complexity: 1 Mod  Jamisha Hoeschen, OTR/L [place]  Office [phone] Pager: [place] 01/26/2021, 12:13 PM

## 2021-01-26 NOTE — Progress Notes (Signed)
Subjective: 2 Days Post-Op Procedure(s) (LRB): TOTAL HIP REVISION (Right) Patient reports pain as mild.   Seen In rounds for Dr. Lyla Glassing Doing well, no complaints  Objective: Vital signs in last 24 hours: Temp:  [99.1 F (37.3 C)-99.4 F (37.4 C)] 99.1 F (37.3 C) (05/06 1901) Pulse Rate:  [96-100] 96 (05/06 1901) Resp:  [18-24] 18 (05/06 1901) BP: (119-128)/(45-66) 128/66 (05/06 1901) SpO2:  [90 %-94 %] 94 % (05/07 1007)  Intake/Output from previous day: 05/06 0701 - 05/07 0700 In: 480 [P.O.:480] Out: 625 [Urine:625] Intake/Output this shift: No intake/output data recorded.  Recent Labs    01/24/21 0344 01/25/21 0309 01/26/21 0404  HGB 11.1* 9.0* 8.5*   Recent Labs    01/25/21 0309 01/26/21 0404  WBC 14.7* 13.8*  RBC 3.04* 2.97*  HCT 27.5* 26.5*  PLT 156 165   Recent Labs    01/25/21 0309 01/26/21 0404  NA 133* 133*  K 4.1 4.1  CL 102 101  CO2 17* 21*  BUN 27* 36*  CREATININE 0.56 1.03*  GLUCOSE 215* 180*  CALCIUM 8.7* 8.8*   No results for input(s): LABPT, INR in the last 72 hours.  Neurologically intact Neurovascular intact Sensation intact distally Intact pulses distally Dorsiflexion/Plantar flexion intact Incision: dressing C/D/I Compartment soft   Assessment/Plan: 2 Days Post-Op Procedure(s) (LRB): TOTAL HIP REVISION (Right) Advance diet Up with therapy Discharge to SNF likely TDWB RLE Aquacel in place, leave until follow up Will resume home Xarelto  Work with [place], PA-C [facility] [phone] 01/26/2021, 11:13 AM

## 2021-01-26 NOTE — Progress Notes (Signed)
PROGRESS NOTE    Lori Rodriguez  ELF:[id number] DOB: Aug 12, 1935 DOA: 01/21/2021 PCP: Penelope Coop, FNP   Chief Complain: Transfer from [facility]  Brief Narrative: Patient is a 86 year old female with history of paroxysmal A. fib on anticoagulation with Xarelto, CKD status 3A, diabetes type 2, hypertension, hyperlipidemia, history of breast cancer, diastolic dysfunction who was sent from [facility] for the management of right nondisplaced fracture of her prosthetic hip as per the report she had a mechanical fall when she slipped over a rug.  Orthopedics did ORIF on 01/24/21.  Hospital course remarkable for A. fib with RVR, intermittent fever, now stable.  PT/OT recommended skilled services on discharge.  Waiting for bed.  Patient is medically stable for discharge to skilled nursing facility as soon as bed is available.  Assessment & Plan:   Principal Problem:   Femur fracture ([place]) Active Problems:   Mixed hyperlipidemia   Long term (current) use of anticoagulants   Atrial fibrillation (HCC)   Chronic kidney disease   Diabetes mellitus without complication ([place])   Right femoral periprosthetic fracture: Transferred here from [facility] for orthopedic management.  Continue pain management, supportive care.  Status post ORIF on 01/24/2021. PT/OT consulted.  Recommendation was SNF on discharge.  She also has right abductor brace was needs to be worn all the time.  A. fib with RVR: Takes labetalol for rate control, on Xarelto for anticoagulation.  She was on A. fib with RVR after admission here.  Started heparin drip for anticoagulation,now stopped.  Xarelto  restarted. Also had to be started on Cardizem drip for A. fib with RVR.  Monitor on telemetry.  Heart rate is well controlled today.  Started on oral Cardizem  Diabetes type 2: Monitor blood sugars.  Continue sliding scale insulin and Lantus.  Recent hemoglobin A1c of 7.1.  Hyperlipidemia: Continue  statin  CKD stage IIIa: Currently kidney function at baseline  Coronary artery disease: Denies any chest pain.  Currently stable.  Echocardiogram showed EF of 55 to 60%, indeterminate left ventricular diastolic parameter, mildly reduced right ventricular systolic function, moderately elevated pulmonary artery pressure .follows with Dr Bettina Gavia  History of breast cancer: We recommend oncology follow-up as an outpatient  Hypertension: Currently blood pressure stable.    Leukocytosis/intermittent fever: Mild leukocytosis,improving.  She has spiked mild grade fever intermittently,now afebrile  .  Urine was not suggestive  of UTI.  Blood  culture,NGTD.  Urine culture showed multiple species patient has history of UTI in the past and she was having some dysuria.  Treated with 3 days course of  ceftriaxone .  Chest x-ray did not show any pneumonia.  History of depression: On Zoloft         DVT prophylaxis:xarelto Code Status: Full Family Communication: Granddaughter at bedside on 01/25/21 status is: Inpatient  Remains inpatient appropriate because:Inpatient level of care appropriate due to severity of illness   Dispo: The patient is from: Home              Anticipated d/c is to: SNF              Patient currently is not medically stable to d/c.   Difficult to place patient No    Consultants: Ortho  Procedures:None  Antimicrobials:  Anti-infectives (From admission, onward)   Start     Dose/Rate Route Frequency Ordered Stop   01/24/21 1830  ceFAZolin (ANCEF) IVPB 2g/100 mL premix        2 g 200  mL/hr over 30 Minutes Intravenous Every 6 hours 01/24/21 1723 01/25/21 0032   01/24/21 1000  ceFAZolin (ANCEF) IVPB 2g/100 mL premix        2 g 200 mL/hr over 30 Minutes Intravenous On call to O.R. 01/24/21 0905 01/24/21 1144   01/23/21 1815  cefTRIAXone (ROCEPHIN) 1 g in sodium chloride 0.9 % 100 mL IVPB        1 g 200 mL/hr over 30 Minutes Intravenous Every 24 hours 01/23/21 1726 01/25/21  1900      Subjective:  Patient seen and examined the bedside this morning.  She looks comfortable.  Pain well controlled.  A. fib looks better with heart rate in the range of 90-110s  Objective: Vitals:   01/25/21 1058 01/25/21 1358 01/25/21 1901 01/26/21 1007  BP: (!) 146/66 (!) 119/45 128/66   Pulse: 92 100 96   Resp: (!) 25 (!) 24 18   Temp: 98.3 F (36.8 C) 99.4 F (37.4 C) 99.1 F (37.3 C)   TempSrc: Oral Oral Oral   SpO2: 92% 90% 90% 94%  Weight:      Height:        Intake/Output Summary (Last 24 hours) at 01/26/2021 1145 Last data filed at 01/25/2021 2309 Gross per 24 hour  Intake 240 ml  Output 625 ml  Net -385 ml   Filed Weights   01/21/21 2000 01/21/21 2235  Weight: 81.2 kg 82.3 kg    Examination:   General exam: Overall comfortable, not in distress, pleasant elderly female HEENT: PERRL Respiratory system:  no wheezes or crackles  Cardiovascular system: Irregularly irregular rhythm Gastrointestinal system: Abdomen is nondistended, soft and nontender. Central nervous system: Alert and oriented Extremities: No edema, no clubbing ,no cyanosis, abductor brace on the right lower extremity, wound VAC Skin: No rashes, no ulcers,no icterus   Data Reviewed: I have personally reviewed following labs and imaging studies  CBC: Recent Labs  Lab 01/22/21 0328 01/23/21 0421 01/24/21 0344 01/25/21 0309 01/26/21 0404  WBC 12.0* 11.6* 12.5* 14.7* 13.8*  NEUTROABS  --  8.5*  --   --  10.8*  HGB 12.7 12.4 11.1* 9.0* 8.5*  HCT 39.4 37.7 34.7* 27.5* 26.5*  MCV 89.5 88.7 91.1 90.5 89.2  PLT 164 146* 149* 156 [id number]   Basic Metabolic Panel: Recent Labs  Lab 01/21/21 2058 01/22/21 0328 01/24/21 0344 01/25/21 0309 01/26/21 0404  NA  --  135 133* 133* 133*  K  --  4.0 4.0 4.1 4.1  CL  --  101 100 102 101  CO2  --  24 22 17* 21*  GLUCOSE  --  178* 198* 215* 180*  BUN  --  17 23 27* 36*  CREATININE 0.74 0.62 0.72 0.56 1.03*  CALCIUM  --  9.1 8.7* 8.7* 8.8*    GFR: Estimated Creatinine Clearance: 45.7 mL/min (A) (by C-G formula based on SCr of 1.03 mg/dL (H)). Liver Function Tests: Recent Labs  Lab 01/22/21 0328  AST 16  ALT 13  ALKPHOS 30*  BILITOT 0.8  PROT 6.9  ALBUMIN 3.8   No results for input(s): LIPASE, AMYLASE in the last 168 hours. No results for input(s): AMMONIA in the last 168 hours. Coagulation Profile: No results for input(s): INR, PROTIME in the last 168 hours. Cardiac Enzymes: No results for input(s): CKTOTAL, CKMB, CKMBINDEX, TROPONINI in the last 168 hours. BNP (last 3 results) Recent Labs    05/25/20 1521  PROBNP 587   HbA1C: No results for input(s): HGBA1C in the  last 72 hours. CBG: Recent Labs  Lab 01/25/21 1211 01/25/21 1613 01/25/21 2040 01/26/21 0737 01/26/21 1132  GLUCAP 294* 282* 306* 166* 271*   Lipid Profile: No results for input(s): CHOL, HDL, LDLCALC, TRIG, CHOLHDL, LDLDIRECT in the last 72 hours. Thyroid Function Tests: No results for input(s): TSH, T4TOTAL, FREET4, T3FREE, THYROIDAB in the last 72 hours. Anemia Panel: No results for input(s): VITAMINB12, FOLATE, FERRITIN, TIBC, IRON, RETICCTPCT in the last 72 hours. Sepsis Labs: No results for input(s): PROCALCITON, LATICACIDVEN in the last 168 hours.  Recent Results (from the past 240 hour(s))  Surgical PCR screen     Status: None   Collection Time: 01/21/21 10:54 PM   Specimen: Nasal Mucosa; Nasal Swab  Result Value Ref Range Status   MRSA, PCR NEGATIVE NEGATIVE Final   Staphylococcus aureus NEGATIVE NEGATIVE Final    Comment: (NOTE) The Xpert SA Assay (FDA approved for NASAL specimens in patients 45 years of age and older), is one component of a comprehensive surveillance program. It is not intended to diagnose infection nor to guide or monitor treatment. Performed at [facility], [place] [address], [place], [place] [address]   Culture, blood (routine x 2)     Status: None (Preliminary result)   Collection  Time: 01/23/21  8:44 AM   Specimen: BLOOD  Result Value Ref Range Status   Specimen Description   Final    BLOOD LEFT ANTECUBITAL Performed at [place] [address], [place], [place] [address]    Special Requests   Final    BOTTLES DRAWN AEROBIC AND ANAEROBIC Blood Culture results may not be optimal due to an excessive volume of blood received in culture bottles Performed at [place] [address], [place], [place] [address]    Culture   Final    NO GROWTH 2 DAYS Performed at [place] [address], [place], [place] [address]    Report Status PENDING  Incomplete  Culture, blood (routine x 2)     Status: None (Preliminary result)   Collection Time: 01/23/21  8:49 AM   Specimen: BLOOD  Result Value Ref Range Status   Specimen Description   Final    BLOOD BLOOD RIGHT HAND Performed at [place] [address] [place], [place] [address]    Special Requests   Final    BOTTLES DRAWN AEROBIC AND ANAEROBIC Blood Culture adequate volume Performed at [place] [address], [place], [place] [address]    Culture   Final    NO GROWTH 2 DAYS Performed at [place] [address], [place], [place] [address]    Report Status PENDING  Incomplete  SARS Coronavirus 2 by RT PCR (hospital order, performed in [facility] hospital lab) Nasopharyngeal     Status: None   Collection Time: 01/23/21  1:26 PM   Specimen: Nasopharyngeal  Result Value Ref Range Status   SARS Coronavirus 2 NEGATIVE NEGATIVE Final    Comment: (NOTE) SARS-CoV-2 target nucleic acids are NOT DETECTED.  The SARS-CoV-2 RNA is generally detectable in upper and lower respiratory specimens during the acute phase of infection. The lowest concentration of SARS-CoV-2 viral copies this assay can detect is 250 copies / mL. A negative result does not preclude SARS-CoV-2 infection and should not be used as the sole  basis for treatment or other patient management decisions.  A negative result may occur with improper specimen collection / handling,  submission of specimen other than nasopharyngeal swab, presence of viral mutation(s) within the areas targeted by this assay, and inadequate number of viral copies (<250 copies / mL). A negative result must be combined with clinical observations, patient history, and epidemiological information.  Fact Sheet for Patients:   StrictlyIdeas.no  Fact Sheet for Healthcare Providers: BankingDealers.co.za  This test is not yet approved or  cleared by the [place] FDA and has been authorized for detection and/or diagnosis of SARS-CoV-2 by FDA under an Emergency Use Authorization (EUA).  This EUA will remain in effect (meaning this test can be used) for the duration of the COVID-19 declaration under Section 564(b)(1) of the Act, 21 U.S.C. section 360bbb-3(b)(1), unless the authorization is terminated or revoked sooner.  Performed at [facility], [place] [address], [place], [place] [address]   Culture, Urine     Status: Abnormal   Collection Time: 01/23/21  1:26 PM   Specimen: Urine, Random  Result Value Ref Range Status   Specimen Description   Final    URINE, RANDOM Performed at [place] [address] [place], [place] [address]    Special Requests   Final    NONE Performed at [facility], [place] [address], [place],  [address]    Culture MULTIPLE SPECIES PRESENT, SUGGEST RECOLLECTION (A)  Final   Report Status 01/25/2021 FINAL  Final         Radiology Studies: DG C-Arm 1-60 Min-No Report  Result Date: 01/24/2021 Fluoroscopy was utilized by the requesting physician.  No radiographic interpretation.   DG HIP PORT UNILAT WITH PELVIS 1V RIGHT  Result Date: 01/24/2021 CLINICAL DATA:  Right hip arthroplasty revision EXAM: DG HIP (WITH  OR WITHOUT PELVIS) 1V PORT RIGHT COMPARISON:  01/19/2021 FINDINGS: Frontal view of the pelvis as well as a frontal view of the right hip are obtained. There has been revision of the femoral component of the right hip arthroplasty, with long stem component and cerclage wires now traversing the proximal femoral subtrochanteric fracture seen previously. Alignment is near anatomic. Postsurgical changes are seen in the soft tissues. IMPRESSION: 1. Revision of the right hip arthroplasty as above, with femoral component now traversing the subtrochanteric femoral fracture seen previously. Near anatomic alignment. Electronically Signed   By: Randa Ngo M.D.   On: 01/24/2021 16:06   DG HIP OPERATIVE UNILAT W OR W/O PELVIS RIGHT  Result Date: 01/24/2021 CLINICAL DATA:  Total right hip revision. EXAM: OPERATIVE right HIP (WITH PELVIS IF PERFORMED)  VIEWS TECHNIQUE: Fluoroscopic spot image(s) were submitted for interpretation post-operatively. COMPARISON:  Jan 22, 2021. FINDINGS: A single C-arm fluoroscopic images less intraoperatively and submitted for post operative interpretation. This image demonstrates hip arthroplasty with cerclage wires. The superior most and inferior most aspect of the hardware is not imaged. Alignment at the hip is difficult to evaluate on this single image. Please see the performing provider's procedural report for further detail. IMPRESSION: Intraoperative fluoroscopy, as detailed above. Electronically Signed   By: Margaretha Sheffield MD   On: 01/24/2021 14:35        Scheduled Meds: . Chlorhexidine Gluconate Cloth  6 each Topical Daily  . diltiazem  240 mg Oral Daily  . docusate sodium  100 mg Oral BID  . insulin aspart  0-15 Units Subcutaneous TID WC  . insulin aspart  0-5 Units Subcutaneous QHS  . insulin glargine  20 Units Subcutaneous Daily  . labetalol  100 mg Oral BID  . rivaroxaban  10 mg Oral Daily   Followed by  . [START ON 01/28/2021] rivaroxaban  20 mg Oral Q supper  .  senna  1 tablet Oral BID  . sertraline  100 mg Oral Daily   Continuous Infusions:    LOS: 5 days    Time spent: 35 mins.More than 50% of that time was spent in counseling and/or coordination of care.      Shelly Coss, MD [facility] P5/03/2021, 11:45 AM

## 2021-01-27 DIAGNOSIS — L8990 Pressure ulcer of unspecified site, unspecified stage: Secondary | ICD-10-CM | POA: Insufficient documentation

## 2021-01-27 HISTORY — DX: Pressure ulcer of unspecified site, unspecified stage: L89.90

## 2021-01-27 LAB — GLUCOSE, CAPILLARY
Glucose-Capillary: 230 mg/dL — ABNORMAL HIGH (ref 70–99)
Glucose-Capillary: 237 mg/dL — ABNORMAL HIGH (ref 70–99)
Glucose-Capillary: 285 mg/dL — ABNORMAL HIGH (ref 70–99)
Glucose-Capillary: 243 mg/dL — ABNORMAL HIGH (ref 70–99)

## 2021-01-27 LAB — CBC WITH DIFFERENTIAL/PLATELET
Abs Immature Granulocytes: 0.17 10*3/uL — ABNORMAL HIGH (ref 0.00–0.07)
Basophils Absolute: 0 10*3/uL (ref 0.0–0.1)
Basophils Relative: 0 %
Eosinophils Absolute: 0.1 10*3/uL (ref 0.0–0.5)
Eosinophils Relative: 0 %
HCT: 24.8 % — ABNORMAL LOW (ref 36.0–46.0)
Hemoglobin: 8.1 g/dL — ABNORMAL LOW (ref 12.0–15.0)
Immature Granulocytes: 1 %
Lymphocytes Relative: 5 %
Lymphs Abs: 0.7 10*3/uL (ref 0.7–4.0)
MCH: 29.1 pg (ref 26.0–34.0)
MCHC: 32.7 g/dL (ref 30.0–36.0)
MCV: 89.2 fL (ref 80.0–100.0)
Monocytes Absolute: 1.7 10*3/uL — ABNORMAL HIGH (ref 0.1–1.0)
Monocytes Relative: 12 %
Neutro Abs: 11 10*3/uL — ABNORMAL HIGH (ref 1.7–7.7)
Neutrophils Relative %: 82 %
Platelets: 233 10*3/uL (ref 150–400)
RBC: 2.78 MIL/uL — ABNORMAL LOW (ref 3.87–5.11)
RDW: 13.3 % (ref 11.5–15.5)
WBC: 13.6 10*3/uL — ABNORMAL HIGH (ref 4.0–10.5)
nRBC: 0 % (ref 0.0–0.2)

## 2021-01-27 MED ORDER — POLYETHYLENE GLYCOL 3350 17 G PO PACK
17.0000 g | PACK | Freq: Every day | ORAL | Status: DC
  Administered 2021-01-27 – 2021-01-29 (×3): 17 g via ORAL
  Filled 2021-01-27 (×3): qty 1

## 2021-01-27 NOTE — Progress Notes (Signed)
Subjective: 3 Days Post-Op Procedure(s) (LRB): TOTAL HIP REVISION (Right)  Patient reports pain as mild to moderate.  Resting in bed.  Denies fever, chills, N/V, CP, SOB.  Tolerating POs well.  Admits to flatus.  States that she is trying to work with therapy.  Objective:   VITALS:  Temp:  [97.9 F (36.6 C)-99 F (37.2 C)] 97.9 F (36.6 C) (05/08 0506) Pulse Rate:  [76-90] 89 (05/08 0506) Resp:  [20-26] 24 (05/07 2021) BP: (100-128)/(59-82) 115/68 (05/08 0506) SpO2:  [94 %-97 %] 97 % (05/08 0506)  General: WDWN patient in NAD. Psych:  Appropriate mood and affect. Neuro:  A&O x 3, Moving all extremities, sensation intact to light touch HEENT:  EOMs intact Chest:  Even non-labored respirations Skin:  Dressing C/D/I, no rashes or lesions Extremities: warm/dry, mild the Right hip/thigh edema,  No erythema or echymosis.  No lymphadenopathy. Pulses: Popliteus 2+ MSK:  ROM: lacks 5 degrees TKE, MMT: able to perform quad set,    LABS Recent Labs    01/25/21 0309 01/26/21 0404 01/27/21 0357  HGB 9.0* 8.5* 8.1*  WBC 14.7* 13.8* 13.6*  PLT 156 165 233   Recent Labs    01/25/21 0309 01/26/21 0404  NA 133* 133*  K 4.1 4.1  CL 102 101  CO2 17* 21*  BUN 27* 36*  CREATININE 0.56 1.03*  GLUCOSE 215* 180*   No results for input(s): LABPT, INR in the last 72 hours.   Assessment/Plan: 3 Days Post-Op Procedure(s) (LRB): TOTAL HIP REVISION (Right)  Patient seen in rounds for Dr. Lyla Glassing. TDWB R LE Up with therapy DVT ppx:  Resume Xarelto upon D/C Disp:  SNF Leave Aquacel dressing in place Plan for outpatient post-op visit with Dr. Riley Lam PA-C [facility] Office:  [phone]

## 2021-01-27 NOTE — Progress Notes (Signed)
PROGRESS NOTE    Lori Rodriguez  JJH:[id number] DOB: [birth year] DOA: 01/21/2021 PCP: Penelope Coop, FNP   Chief Complain: Transfer from [facility]  Brief Narrative: Patient is a 86 year old female with history of paroxysmal A. fib on anticoagulation with Xarelto, CKD status 3A, diabetes type 2, hypertension, hyperlipidemia, history of breast cancer, diastolic dysfunction who was sent from [facility] for the management of right nondisplaced fracture of her prosthetic hip as per the report she had a mechanical fall when she slipped over a rug.  Orthopedics did ORIF on 01/24/21.  Hospital course remarkable for A. fib with RVR, intermittent fever, now stable.  PT/OT recommended skilled services on discharge.  Waiting for bed.  Patient is medically stable for discharge to skilled nursing facility as soon as bed is available.  Assessment & Plan:   Principal Problem:   Femur fracture ([place]) Active Problems:   Mixed hyperlipidemia   Long term (current) use of anticoagulants   Atrial fibrillation (HCC)   Chronic kidney disease   Diabetes mellitus without complication (HCC)   Pressure injury of skin   Right femoral periprosthetic fracture: Transferred here from [facility] for orthopedic management.  Continue pain management, supportive care.  Status post ORIF on 01/24/2021. PT/OT consulted.  Recommendation was SNF on discharge.  She also has right abductor brace was needs to be worn all the time.  A. fib with RVR: Takes labetalol for rate control, on Xarelto for anticoagulation.  She was on A. fib with RVR after admission here.  Started heparin drip for anticoagulation,now stopped.  Xarelto  restarted. Also had to be started on Cardizem drip for A. fib with RVR.  Monitor on telemetry.  Heart rate is well controlled today.  Started on oral Cardizem  Diabetes type 2: Monitor blood sugars.  Continue sliding scale insulin and Lantus.  Recent hemoglobin A1c of  7.1.  Hyperlipidemia: Continue statin  CKD stage IIIa: Currently kidney function at baseline  Coronary artery disease: Denies any chest pain.  Currently stable.  Echocardiogram showed EF of 55 to 60%, indeterminate left ventricular diastolic parameter, mildly reduced right ventricular systolic function, moderately elevated pulmonary artery pressure .follows with Dr Bettina Gavia  History of breast cancer: We recommend oncology follow-up as an outpatient  Hypertension: Currently blood pressure stable.    Leukocytosis/intermittent fever: Mild leukocytosis,improving.  She has spiked mild grade fever intermittently,now afebrile  .  Urine was not suggestive  of UTI.  Blood  culture,NGTD.  Urine culture showed multiple species patient has history of UTI in the past and she was having some dysuria.  Treated with 3 days course of  ceftriaxone .  Chest x-ray did not show any pneumonia.  History of depression: On Zoloft         DVT prophylaxis:xarelto Code Status: Full Family Communication: Granddaughter at bedside on 01/25/21 status is: Inpatient  Remains inpatient appropriate because:Inpatient level of care appropriate due to severity of illness   Dispo: The patient is from: Home              Anticipated d/c is to: SNF              Patient currently is not medically stable to d/c.   Difficult to place patient No    Consultants: Ortho  Procedures:None  Antimicrobials:  Anti-infectives (From admission, onward)   Start     Dose/Rate Route Frequency Ordered Stop   01/24/21 1830  ceFAZolin (ANCEF) IVPB 2g/100 mL premix  2 g 200 mL/hr over 30 Minutes Intravenous Every 6 hours 01/24/21 1723 01/25/21 0032   01/24/21 1000  ceFAZolin (ANCEF) IVPB 2g/100 mL premix        2 g 200 mL/hr over 30 Minutes Intravenous On call to O.R. 01/24/21 0905 01/24/21 1144   01/23/21 1815  cefTRIAXone (ROCEPHIN) 1 g in sodium chloride 0.9 % 100 mL IVPB        1 g 200 mL/hr over 30 Minutes Intravenous  Every 24 hours 01/23/21 1726 01/25/21 1900      Subjective:  Patient seen and examined the bedside this morning.  Hemodynamically stable.  Denies any complaints.  Heart rate well controlled today.  Objective: Vitals:   01/26/21 1219 01/26/21 1615 01/26/21 2021 01/27/21 0506  BP: 100/82 (!) 124/59 128/60 115/68  Pulse: 76 90 87 89  Resp: (!) 26 20 (!) 24   Temp: 98.2 F (36.8 C) 98.2 F (36.8 C) 99 F (37.2 C) 97.9 F (36.6 C)  TempSrc:  Oral Oral Oral  SpO2: 96% 97% 96% 97%  Weight:      Height:        Intake/Output Summary (Last 24 hours) at 01/27/2021 1148 Last data filed at 01/27/2021 4034 Gross per 24 hour  Intake 29.58 ml  Output 800 ml  Net -770.42 ml   Filed Weights   01/21/21 2000 01/21/21 2235  Weight: 81.2 kg 82.3 kg    Examination:   General exam: Overall comfortable, not in distress, pleasant elderly female HEENT: PERRL Respiratory system:  no wheezes or crackles  Cardiovascular system: Irregularly irregular rhythm Gastrointestinal system: Abdomen is nondistended, soft and nontender. Central nervous system: Alert and oriented Extremities: No edema, no clubbing ,no cyanosis, abductor brace, wound VAC Skin: No rashes, no ulcers,no icterus   Data Reviewed: I have personally reviewed following labs and imaging studies  CBC: Recent Labs  Lab 01/23/21 0421 01/24/21 0344 01/25/21 0309 01/26/21 0404 01/27/21 0357  WBC 11.6* 12.5* 14.7* 13.8* 13.6*  NEUTROABS 8.5*  --   --  10.8* 11.0*  HGB 12.4 11.1* 9.0* 8.5* 8.1*  HCT 37.7 34.7* 27.5* 26.5* 24.8*  MCV 88.7 91.1 90.5 89.2 89.2  PLT 146* 149* 156 165 742   Basic Metabolic Panel: Recent Labs  Lab 01/21/21 2058 01/22/21 0328 01/24/21 0344 01/25/21 0309 01/26/21 0404  NA  --  135 133* 133* 133*  K  --  4.0 4.0 4.1 4.1  CL  --  101 100 102 101  CO2  --  24 22 17* 21*  GLUCOSE  --  178* 198* 215* 180*  BUN  --  17 23 27* 36*  CREATININE 0.74 0.62 0.72 0.56 1.03*  CALCIUM  --  9.1 8.7* 8.7*  8.8*   GFR: Estimated Creatinine Clearance: 45.7 mL/min (A) (by C-G formula based on SCr of 1.03 mg/dL (H)). Liver Function Tests: Recent Labs  Lab 01/22/21 0328  AST 16  ALT 13  ALKPHOS 30*  BILITOT 0.8  PROT 6.9  ALBUMIN 3.8   No results for input(s): LIPASE, AMYLASE in the last 168 hours. No results for input(s): AMMONIA in the last 168 hours. Coagulation Profile: No results for input(s): INR, PROTIME in the last 168 hours. Cardiac Enzymes: No results for input(s): CKTOTAL, CKMB, CKMBINDEX, TROPONINI in the last 168 hours. BNP (last 3 results) Recent Labs    05/25/20 1521  PROBNP 587   HbA1C: No results for input(s): HGBA1C in the last 72 hours. CBG: Recent Labs  Lab 01/26/21 1132  01/26/21 1645 01/26/21 2024 01/27/21 0812 01/27/21 1120  GLUCAP 271* 231* 249* 230* 237*   Lipid Profile: No results for input(s): CHOL, HDL, LDLCALC, TRIG, CHOLHDL, LDLDIRECT in the last 72 hours. Thyroid Function Tests: No results for input(s): TSH, T4TOTAL, FREET4, T3FREE, THYROIDAB in the last 72 hours. Anemia Panel: No results for input(s): VITAMINB12, FOLATE, FERRITIN, TIBC, IRON, RETICCTPCT in the last 72 hours. Sepsis Labs: No results for input(s): PROCALCITON, LATICACIDVEN in the last 168 hours.  Recent Results (from the past 240 hour(s))  Surgical PCR screen     Status: None   Collection Time: 01/21/21 10:54 PM   Specimen: Nasal Mucosa; Nasal Swab  Result Value Ref Range Status   MRSA, PCR NEGATIVE NEGATIVE Final   Staphylococcus aureus NEGATIVE NEGATIVE Final    Comment: (NOTE) The Xpert SA Assay (FDA approved for NASAL specimens in patients 56 years of age and older), is one component of a comprehensive surveillance program. It is not intended to diagnose infection nor to guide or monitor treatment. Performed at [facility], [place] [address], [place], [place] [address]   Culture, blood (routine x 2)     Status: None (Preliminary result)    Collection Time: 01/23/21  8:44 AM   Specimen: BLOOD  Result Value Ref Range Status   Specimen Description   Final    BLOOD LEFT ANTECUBITAL Performed at [facility] [address], [place], [place] [address]    Special Requests   Final    BOTTLES DRAWN AEROBIC AND ANAEROBIC Blood Culture results may not be optimal due to an excessive volume of blood received in culture bottles Performed at [facility] [address], [place], [place] [address]    Culture   Final    NO GROWTH 4 DAYS Performed at [facility], [place] [address], [place], [place] [address]    Report Status PENDING  Incomplete  Culture, blood (routine x 2)     Status: None (Preliminary result)   Collection Time: 01/23/21  8:49 AM   Specimen: BLOOD  Result Value Ref Range Status   Specimen Description   Final    BLOOD BLOOD RIGHT HAND Performed at [facility] [address] [place], [place] [address]    Special Requests   Final    BOTTLES DRAWN AEROBIC AND ANAEROBIC Blood Culture adequate volume Performed at [facility] [address] [place], [place] [address]    Culture   Final    NO GROWTH 4 DAYS Performed at [facility], [place] [address] [place], [place] [address]    Report Status PENDING  Incomplete  SARS Coronavirus 2 by RT PCR (hospital order, performed in [facility] hospital lab) Nasopharyngeal     Status: None   Collection Time: 01/23/21  1:26 PM   Specimen: Nasopharyngeal  Result Value Ref Range Status   SARS Coronavirus 2 NEGATIVE NEGATIVE Final    Comment: (NOTE) SARS-CoV-2 target nucleic acids are NOT DETECTED.  The SARS-CoV-2 RNA is generally detectable in upper and lower respiratory specimens during the acute phase of infection. The lowest concentration of SARS-CoV-2 viral copies this assay can detect is 250 copies / mL. A negative result does not preclude SARS-CoV-2 infection and should not be used  as the sole basis for treatment or other patient management decisions.  A negative result may occur with improper specimen collection / handling, submission of specimen other than nasopharyngeal swab, presence of viral  mutation(s) within the areas targeted by this assay, and inadequate number of viral copies (<250 copies / mL). A negative result must be combined with clinical observations, patient history, and epidemiological information.  Fact Sheet for Patients:   StrictlyIdeas.no  Fact Sheet for Healthcare Providers: BankingDealers.co.za  This test is not yet approved or  cleared by the [place] FDA and has been authorized for detection and/or diagnosis of SARS-CoV-2 by FDA under an Emergency Use Authorization (EUA).  This EUA will remain in effect (meaning this test can be used) for the duration of the COVID-19 declaration under Section 564(b)(1) of the Act, 21 U.S.C. section 360bbb-3(b)(1), unless the authorization is terminated or revoked sooner.  Performed at [facility], [place] [address] [place], [place] [address]   Culture, Urine     Status: Abnormal   Collection Time: 01/23/21  1:26 PM   Specimen: Urine, Random  Result Value Ref Range Status   Specimen Description   Final    URINE, RANDOM Performed at [facility] [address] [place], [place] [address]    Special Requests   Final    NONE Performed at [facility], [place] [address], [place], [place] [address]    Culture MULTIPLE SPECIES PRESENT, SUGGEST RECOLLECTION (A)  Final   Report Status 01/25/2021 FINAL  Final         Radiology Studies: No results found.      Scheduled Meds: . Chlorhexidine Gluconate Cloth  6 each Topical Daily  . diltiazem  240 mg Oral Daily  . docusate sodium  100 mg Oral BID  . insulin aspart  0-15 Units Subcutaneous TID WC  . insulin aspart  0-5 Units Subcutaneous  QHS  . insulin glargine  20 Units Subcutaneous Daily  . labetalol  100 mg Oral BID  . polyethylene glycol  17 g Oral Daily  . [START ON 01/28/2021] rivaroxaban  20 mg Oral Q supper  . senna  1 tablet Oral BID  . sertraline  100 mg Oral Daily   Continuous Infusions:    LOS: 6 days    Time spent: 15 mins.More than 50% of that time was spent in counseling and/or coordination of care.      Shelly Coss, MD [facility] P5/04/2021, 11:48 AM

## 2021-01-28 LAB — CBC WITH DIFFERENTIAL/PLATELET
Abs Immature Granulocytes: 0.31 10*3/uL — ABNORMAL HIGH (ref 0.00–0.07)
Basophils Absolute: 0 10*3/uL (ref 0.0–0.1)
Basophils Relative: 0 %
Eosinophils Absolute: 0.1 10*3/uL (ref 0.0–0.5)
Eosinophils Relative: 1 %
HCT: 24 % — ABNORMAL LOW (ref 36.0–46.0)
Hemoglobin: 7.7 g/dL — ABNORMAL LOW (ref 12.0–15.0)
Immature Granulocytes: 2 %
Lymphocytes Relative: 7 %
Lymphs Abs: 0.9 10*3/uL (ref 0.7–4.0)
MCH: 28.6 pg (ref 26.0–34.0)
MCHC: 32.1 g/dL (ref 30.0–36.0)
MCV: 89.2 fL (ref 80.0–100.0)
Monocytes Absolute: 1.5 10*3/uL — ABNORMAL HIGH (ref 0.1–1.0)
Monocytes Relative: 12 %
Neutro Abs: 9.9 10*3/uL — ABNORMAL HIGH (ref 1.7–7.7)
Neutrophils Relative %: 78 %
Platelets: 285 10*3/uL (ref 150–400)
RBC: 2.69 MIL/uL — ABNORMAL LOW (ref 3.87–5.11)
RDW: 13.7 % (ref 11.5–15.5)
WBC: 12.8 10*3/uL — ABNORMAL HIGH (ref 4.0–10.5)
nRBC: 0.2 % (ref 0.0–0.2)

## 2021-01-28 LAB — CULTURE, BLOOD (ROUTINE X 2)
Culture: NO GROWTH
Culture: NO GROWTH
Special Requests: ADEQUATE

## 2021-01-28 LAB — GLUCOSE, CAPILLARY
Glucose-Capillary: 228 mg/dL — ABNORMAL HIGH (ref 70–99)
Glucose-Capillary: 254 mg/dL — ABNORMAL HIGH (ref 70–99)
Glucose-Capillary: 238 mg/dL — ABNORMAL HIGH (ref 70–99)
Glucose-Capillary: 274 mg/dL — ABNORMAL HIGH (ref 70–99)

## 2021-01-28 LAB — RESP PANEL BY RT-PCR (FLU A&B, COVID) ARPGX2
Influenza A by PCR: NEGATIVE
Influenza B by PCR: NEGATIVE
SARS Coronavirus 2 by RT PCR: NEGATIVE

## 2021-01-28 NOTE — Progress Notes (Signed)
Physical Therapy Treatment Patient Details Name: Lori Rodriguez MRN: [id number] DOB: [birth year] Today's Date: 01/28/2021    History of Present Illness Pt is 86 yo female admitted s/p fall with R nondisplaced fx of prosthetic hip.  She initially went to [facility] and transferred to [facility] due to need for complext surgery.  Pt reports initial fall on 01/19/21.  Pt had revision of R THA on 01/24/21.  She has PMH of Atrial fibrillation, chronic kidney disease stage III, diabetes, hypertension, hyperlipidemia, history of breast cancer on the left    PT Comments    Pt pleasant and cooperative.  Reviewed precautions with patient and assisted with performing sit to stands.  Pt requiring at least max +2 assist to perform bed mobility and standing.  Continue to recommend SNF upon d/c.   Follow Up Recommendations  SNF     Equipment Recommendations  Wheelchair cushion (measurements PT);Wheelchair (measurements PT);Hospital bed    Recommendations for Other Services       Precautions / Restrictions Precautions Precautions: Fall;Posterior Hip Precaution Booklet Issued: No Precaution Comments: Educated on precautions, has NPWT to incision Required Braces or Orthoses: Other Brace Other Brace: Hip Abduction brace at all times except for hygiene. Restrictions Weight Bearing Restrictions: Yes RLE Weight Bearing: Partial weight bearing RLE Partial Weight Bearing Percentage or Pounds: 30%    Mobility  Bed Mobility Overal bed mobility: Needs Assistance Bed Mobility: Supine to Sit;Sit to Supine     Supine to sit: +2 for physical assistance;Max assist Sit to supine: +2 for physical assistance;Max assist   General bed mobility comments: assist for upper and lower body, pt with weak upper extremities and requiring increased assist, utilized bed pad to assist with positioning    Transfers Overall transfer level: Needs assistance Equipment used: None Transfers: Sit to/from  Stand Sit to Stand: +2 physical assistance;+2 safety/equipment;From elevated surface;Max assist         General transfer comment: mutlimodal cues for technique and precautions, pt with difficulty placing most weight through L LE and arms, performed x4 for strengthening and technique, pt also with posterior COG and feet forward so unable to shift/pivot to recliner  Ambulation/Gait                 Stairs             Wheelchair Mobility    Modified Rankin (Stroke Patients Only)       Balance Overall balance assessment: Needs assistance Sitting-balance support: Bilateral upper extremity supported Sitting balance-Leahy Scale: Poor   Postural control: Posterior lean   Standing balance-Leahy Scale: Zero Standing balance comment: requiring external assistance                            Cognition Arousal/Alertness: Awake/alert Behavior During Therapy: WFL for tasks assessed/performed Overall Cognitive Status: Within Functional Limits for tasks assessed                                        Exercises General Exercises - Lower Extremity Ankle Circles/Pumps: AROM;10 reps;Seated;Both Long Arc Quad: AROM;Both;10 reps;Seated    General Comments        Pertinent Vitals/Pain Pain Assessment: Faces Faces Pain Scale: Hurts even more Pain Location: R hip and thigh Pain Descriptors / Indicators: Grimacing;Guarding;Aching;Sore Pain Intervention(s): Repositioned;Monitored during session    Home Living  Prior Function            PT Goals (current goals can now be found in the care plan section) Progress towards PT goals: Progressing toward goals    Frequency    Min 3X/week      PT Plan Current plan remains appropriate    Co-evaluation              AM-PAC PT "6 Clicks" Mobility   Outcome Measure  Help needed turning from your back to your side while in a flat bed without using bedrails?:  Total Help needed moving from lying on your back to sitting on the side of a flat bed without using bedrails?: Total Help needed moving to and from a bed to a chair (including a wheelchair)?: Total Help needed standing up from a chair using your arms (e.g., wheelchair or bedside chair)?: Total Help needed to walk in hospital room?: Total Help needed climbing 3-5 steps with a railing? : Total 6 Click Score: 6    End of Session Equipment Utilized During Treatment: Gait belt;Other (comment) (hip abductor brace) Activity Tolerance: Patient tolerated treatment well Patient left: in bed;with call bell/phone within reach;with nursing/sitter in room Nurse Communication: Mobility status PT Visit Diagnosis: Other abnormalities of gait and mobility (R26.89);Muscle weakness (generalized) (M62.81);History of falling (Z91.81)     Time: 9038-3338 PT Time Calculation (min) (ACUTE ONLY): 19 min  Charges:  $Therapeutic Activity: 8-22 mins                    Jannette Spanner PT, DPT Acute Rehabilitation Services Pager: [phone] Office: [phone]  Jadaya Sommerfield,KATHrine E 01/28/2021, 10:54 AM

## 2021-01-28 NOTE — Progress Notes (Signed)
PROGRESS NOTE    Lori Rodriguez  PYP:[id number] DOB: 01-25-1935 DOA: 01/21/2021 PCP: Penelope Coop, FNP   Chief Complain: Transfer from [facility]  Brief Narrative: Patient is a 86 year old female with history of paroxysmal A. fib on anticoagulation with Xarelto, CKD status 3A, diabetes type 2, hypertension, hyperlipidemia, history of breast cancer, diastolic dysfunction who was sent from [facility] for the management of right nondisplaced fracture of her prosthetic hip as per the report she had a mechanical fall when she slipped over a rug.  Orthopedics did ORIF on 01/24/21.  Hospital course remarkable for A. fib with RVR, intermittent fever, now stable.  PT/OT recommended skilled services on discharge.  Waiting for bed.  Patient is medically stable for discharge to skilled nursing facility as soon as bed is available.  Assessment & Plan:   Principal Problem:   Femur fracture ([place]) Active Problems:   Mixed hyperlipidemia   Long term (current) use of anticoagulants   Atrial fibrillation (HCC)   Chronic kidney disease   Diabetes mellitus without complication (HCC)   Pressure injury of skin   Right femoral periprosthetic fracture: Transferred here from [facility] for orthopedic management.  Continue pain management, supportive care.  Status post ORIF on 01/24/2021. PT/OT consulted.  Recommendation was SNF on discharge.  She also has right abductor brace was needs to be worn all the time.  A. fib with RVR: Takes labetalol for rate control, on Xarelto for anticoagulation.  She was on A. fib with RVR after admission here.  Started heparin drip for anticoagulation,now stopped.  Xarelto  restarted. Also had to be started on Cardizem drip for A. fib with RVR.  Monitor on telemetry.  Heart rate is well controlled today.  Started on oral Cardizem  Diabetes type 2: Monitor blood sugars.  Continue sliding scale insulin and Lantus.  Recent hemoglobin A1c of  7.1.  Hyperlipidemia: Continue statin  CKD stage IIIa: Currently kidney function at baseline  Coronary artery disease: Denies any chest pain.  Currently stable.  Echocardiogram showed EF of 55 to 60%, indeterminate left ventricular diastolic parameter, mildly reduced right ventricular systolic function, moderately elevated pulmonary artery pressure .follows with Dr Bettina Gavia  History of breast cancer: We recommend oncology follow-up as an outpatient  Hypertension: Currently blood pressure stable.    Leukocytosis/intermittent fever: Mild leukocytosis,improving.  She has spiked mild grade fever intermittently,now afebrile  .  Urine was not suggestive  of UTI.  Blood  culture,NGTD.  Urine culture showed multiple species patient has history of UTI in the past and she was having some dysuria.  Treated with 3 days course of  ceftriaxone .  Chest x-ray did not show any pneumonia.  History of depression: On Zoloft         DVT prophylaxis:xarelto Code Status: Full Family Communication: Granddaughter at bedside on 01/25/21.  Called  daughter for update today, phone not received status is: Inpatient  Remains inpatient appropriate because:Inpatient level of care appropriate due to severity of illness   Dispo: The patient is from: Home              Anticipated d/c is to: SNF              Patient currently is not medically stable to d/c.   Difficult to place patient No    Consultants: Ortho  Procedures:None  Antimicrobials:  Anti-infectives (From admission, onward)   Start     Dose/Rate Route Frequency Ordered Stop   01/24/21 1830  ceFAZolin (ANCEF) IVPB 2g/100 mL premix        2 g 200 mL/hr over 30 Minutes Intravenous Every 6 hours 01/24/21 1723 01/25/21 0032   01/24/21 1000  ceFAZolin (ANCEF) IVPB 2g/100 mL premix        2 g 200 mL/hr over 30 Minutes Intravenous On call to O.R. 01/24/21 0905 01/24/21 1144   01/23/21 1815  cefTRIAXone (ROCEPHIN) 1 g in sodium chloride 0.9 % 100 mL IVPB         1 g 200 mL/hr over 30 Minutes Intravenous Every 24 hours 01/23/21 1726 01/25/21 1900      Subjective:  Patient seen and examined the bedside this morning.  Comfortable.  No active issues.  Waiting for skilled nursing facility bed.  Denies any complaints  Objective: Vitals:   01/27/21 1211 01/27/21 2135 01/28/21 0510 01/28/21 1034  BP: 114/69 (!) 146/62 (!) 141/83 (!) 148/57  Pulse: 78 84 91 100  Resp: 19 18 18    Temp: 98.5 F (36.9 C) 98.2 F (36.8 C) 98.1 F (36.7 C)   TempSrc: Oral Oral Oral   SpO2: 97% 94% 96%   Weight:      Height:        Intake/Output Summary (Last 24 hours) at 01/28/2021 1244 Last data filed at 01/28/2021 1100 Gross per 24 hour  Intake 240 ml  Output 1500 ml  Net -1260 ml   Filed Weights   01/21/21 2000 01/21/21 2235  Weight: 81.2 kg 82.3 kg    Examination:   General exam: Overall comfortable, not in distress, pleasant elderly female HEENT: PERRL Respiratory system:  no wheezes or crackles  Cardiovascular system: Irregularly irregular rhythm Gastrointestinal system: Abdomen is nondistended, soft and nontender. Central nervous system: Alert and oriented Extremities: No edema, no clubbing ,no cyanosis, left abductor brace, wound VAC Skin: No rashes, no ulcers,no icterus   Data Reviewed: I have personally reviewed following labs and imaging studies  CBC: Recent Labs  Lab 01/23/21 0421 01/24/21 0344 01/25/21 0309 01/26/21 0404 01/27/21 0357 01/28/21 0325  WBC 11.6* 12.5* 14.7* 13.8* 13.6* 12.8*  NEUTROABS 8.5*  --   --  10.8* 11.0* 9.9*  HGB 12.4 11.1* 9.0* 8.5* 8.1* 7.7*  HCT 37.7 34.7* 27.5* 26.5* 24.8* 24.0*  MCV 88.7 91.1 90.5 89.2 89.2 89.2  PLT 146* 149* 156 165 233 245   Basic Metabolic Panel: Recent Labs  Lab 01/21/21 2058 01/22/21 0328 01/24/21 0344 01/25/21 0309 01/26/21 0404  NA  --  135 133* 133* 133*  K  --  4.0 4.0 4.1 4.1  CL  --  101 100 102 101  CO2  --  24 22 17* 21*  GLUCOSE  --  178* 198* 215*  180*  BUN  --  17 23 27* 36*  CREATININE 0.74 0.62 0.72 0.56 1.03*  CALCIUM  --  9.1 8.7* 8.7* 8.8*   GFR: Estimated Creatinine Clearance: 45.7 mL/min (A) (by C-G formula based on SCr of 1.03 mg/dL (H)). Liver Function Tests: Recent Labs  Lab 01/22/21 0328  AST 16  ALT 13  ALKPHOS 30*  BILITOT 0.8  PROT 6.9  ALBUMIN 3.8   No results for input(s): LIPASE, AMYLASE in the last 168 hours. No results for input(s): AMMONIA in the last 168 hours. Coagulation Profile: No results for input(s): INR, PROTIME in the last 168 hours. Cardiac Enzymes: No results for input(s): CKTOTAL, CKMB, CKMBINDEX, TROPONINI in the last 168 hours. BNP (last 3 results) Recent Labs    05/25/20 1521  PROBNP 587   HbA1C: No results for input(s): HGBA1C in the last 72 hours. CBG: Recent Labs  Lab 01/27/21 1120 01/27/21 1614 01/27/21 2218 01/28/21 0723 01/28/21 1117  GLUCAP 237* 285* 243* 228* 254*   Lipid Profile: No results for input(s): CHOL, HDL, LDLCALC, TRIG, CHOLHDL, LDLDIRECT in the last 72 hours. Thyroid Function Tests: No results for input(s): TSH, T4TOTAL, FREET4, T3FREE, THYROIDAB in the last 72 hours. Anemia Panel: No results for input(s): VITAMINB12, FOLATE, FERRITIN, TIBC, IRON, RETICCTPCT in the last 72 hours. Sepsis Labs: No results for input(s): PROCALCITON, LATICACIDVEN in the last 168 hours.  Recent Results (from the past 240 hour(s))  Surgical PCR screen     Status: None   Collection Time: 01/21/21 10:54 PM   Specimen: Nasal Mucosa; Nasal Swab  Result Value Ref Range Status   MRSA, PCR NEGATIVE NEGATIVE Final   Staphylococcus aureus NEGATIVE NEGATIVE Final    Comment: (NOTE) The Xpert SA Assay (FDA approved for NASAL specimens in patients 62 years of age and older), is one component of a comprehensive surveillance program. It is not intended to diagnose infection nor to guide or monitor treatment. Performed at [facility], [place] [address], [place], [place] [address]   Culture, blood (routine x 2)     Status: None   Collection Time: 01/23/21  8:44 AM   Specimen: BLOOD  Result Value Ref Range Status   Specimen Description   Final    BLOOD LEFT ANTECUBITAL Performed at [facility] [address], [place], [place] [address]    Special Requests   Final    BOTTLES DRAWN AEROBIC AND ANAEROBIC Blood Culture results may not be optimal due to an excessive volume of blood received in culture bottles Performed at [facility] [address] [place], [place] [address]    Culture   Final    NO GROWTH 5 DAYS Performed at [facility], [place] [address], [place], [place] [address]    Report Status 01/28/2021 FINAL  Final  Culture, blood (routine x 2)     Status: None   Collection Time: 01/23/21  8:49 AM   Specimen: BLOOD  Result Value Ref Range Status   Specimen Description   Final    BLOOD BLOOD RIGHT HAND Performed at [place] [address] [place], [place] [address]    Special Requests   Final    BOTTLES DRAWN AEROBIC AND ANAEROBIC Blood Culture adequate volume Performed at [place] [address] [place], [place] [address]    Culture   Final    NO GROWTH 5 DAYS Performed at [facility], [place] [address], [place],  [address]    Report Status 01/28/2021 FINAL  Final  SARS Coronavirus 2 by RT PCR (hospital order, performed in [facility] hospital lab) Nasopharyngeal     Status: None   Collection Time: 01/23/21  1:26 PM   Specimen: Nasopharyngeal  Result Value Ref Range Status   SARS Coronavirus 2 NEGATIVE NEGATIVE Final    Comment: (NOTE) SARS-CoV-2 target nucleic acids are NOT DETECTED.  The SARS-CoV-2 RNA is generally detectable in upper and lower respiratory specimens during the acute phase of infection. The lowest concentration of SARS-CoV-2 viral copies this assay can detect is 250 copies / mL. A negative  result does not preclude SARS-CoV-2 infection and should not be used as the sole basis for treatment or other patient management decisions.  A  negative result may occur with improper specimen collection / handling, submission of specimen other than nasopharyngeal swab, presence of viral mutation(s) within the areas targeted by this assay, and inadequate number of viral copies (<250 copies / mL). A negative result must be combined with clinical observations, patient history, and epidemiological information.  Fact Sheet for Patients:   StrictlyIdeas.no  Fact Sheet for Healthcare Providers: BankingDealers.co.za  This test is not yet approved or  cleared by the [place] FDA and has been authorized for detection and/or diagnosis of SARS-CoV-2 by FDA under an Emergency Use Authorization (EUA).  This EUA will remain in effect (meaning this test can be used) for the duration of the COVID-19 declaration under Section 564(b)(1) of the Act, 21 U.S.C. section 360bbb-3(b)(1), unless the authorization is terminated or revoked sooner.  Performed at [facility], [place] [address], [place], [place] [address]   Culture, Urine     Status: Abnormal   Collection Time: 01/23/21  1:26 PM   Specimen: Urine, Random  Result Value Ref Range Status   Specimen Description   Final    URINE, RANDOM Performed at [facility] [address], [place], [place] [address]    Special Requests   Final    NONE Performed at [facility], [place] [address], [place], [place] [address]    Culture MULTIPLE SPECIES PRESENT, SUGGEST RECOLLECTION (A)  Final   Report Status 01/25/2021 FINAL  Final  Resp Panel by RT-PCR (Flu A&B, Covid) Nasopharyngeal Swab     Status: None   Collection Time: 01/28/21 10:54 AM   Specimen: Nasopharyngeal Swab; Nasopharyngeal(NP) swabs in vial transport medium  Result Value Ref Range Status    SARS Coronavirus 2 by RT PCR NEGATIVE NEGATIVE Final    Comment: (NOTE) SARS-CoV-2 target nucleic acids are NOT DETECTED.  The SARS-CoV-2 RNA is generally detectable in upper respiratory specimens during the acute phase of infection. The lowest concentration of SARS-CoV-2 viral copies this assay can detect is 138 copies/mL. A negative result does not preclude SARS-Cov-2 infection and should not be used as the sole basis for treatment or other patient management decisions. A negative result may occur with  improper specimen collection/handling, submission of specimen other than nasopharyngeal swab, presence of viral mutation(s) within the areas targeted by this assay, and inadequate number of viral copies(<138 copies/mL). A negative result must be combined with clinical observations, patient history, and epidemiological information. The expected result is Negative.  Fact Sheet for Patients:  EntrepreneurPulse.com.au  Fact Sheet for Healthcare Providers:  IncredibleEmployment.be  This test is no t yet approved or cleared by the [place] FDA and  has been authorized for detection and/or diagnosis of SARS-CoV-2 by FDA under an Emergency Use Authorization (EUA). This EUA will remain  in effect (meaning this test can be used) for the duration of the COVID-19 declaration under Section 564(b)(1) of the Act, 21 U.S.C.section 360bbb-3(b)(1), unless the authorization is terminated  or revoked sooner.       Influenza A by PCR NEGATIVE NEGATIVE Final   Influenza B by PCR NEGATIVE NEGATIVE Final    Comment: (NOTE) The Xpert Xpress SARS-CoV-2/FLU/RSV plus assay is intended as an aid in the diagnosis of influenza from Nasopharyngeal swab specimens and should not be used as a sole basis for treatment. Nasal washings and aspirates are unacceptable for Xpert Xpress SARS-CoV-2/FLU/RSV testing.  Fact Sheet for  Patients: EntrepreneurPulse.com.au  Fact Sheet for Healthcare Providers: IncredibleEmployment.be  This test is not yet  approved or cleared by the [place] and has been authorized for detection and/or diagnosis of SARS-CoV-2 by FDA under an Emergency Use Authorization (EUA). This EUA will remain in effect (meaning this test can be used) for the duration of the COVID-19 declaration under Section 564(b)(1) of the Act, 21 U.S.C. section 360bbb-3(b)(1), unless the authorization is terminated or revoked.  Performed at [facility], [place] [address] [place], [place] [address]          Radiology Studies: No results found.      Scheduled Meds: . Chlorhexidine Gluconate Cloth  6 each Topical Daily  . diltiazem  240 mg Oral Daily  . docusate sodium  100 mg Oral BID  . insulin aspart  0-15 Units Subcutaneous TID WC  . insulin aspart  0-5 Units Subcutaneous QHS  . insulin glargine  20 Units Subcutaneous Daily  . labetalol  100 mg Oral BID  . polyethylene glycol  17 g Oral Daily  . rivaroxaban  20 mg Oral Q supper  . senna  1 tablet Oral BID  . sertraline  100 mg Oral Daily   Continuous Infusions:    LOS: 7 days    Time spent: 15 mins.More than 50% of that time was spent in counseling and/or coordination of care.      Shelly Coss, MD [facility] P5/05/2021, 12:44 PM

## 2021-01-28 NOTE — TOC Progression Note (Addendum)
Transition of Care [facility]) - Progression Note    Patient Details  Name: Lori Rodriguez MRN: [id number] Date of Birth: 04-25-1935  Transition [facility]) CM/SW Contact  Lori Rodriguez, [place] Phone Number: 01/28/2021, 3:56 PM  Clinical Narrative:     CSW spoke to patient and asked about choice of facility.  Patient requested to speak to daughter, CSW attempted to contact daughter, and had to leave a message.  CSW to try again at a later time.  CSW informed patient that [facility], is not able to provide a bed.  4:45pm  CSW received phone call from patient's daughter Lori Rodriguez.  CSW discussed SNF placement, and also bed offers.  Patient's daughter requested that bed offers be emailed to her at [email or web address].  CSW emailed bed offers and asked her to call or email back CSW with decision so CSW can update patient's insurance company.   Expected Discharge Plan: [place] Barriers to Discharge: Continued Medical Work up  Expected Discharge Plan and Services Expected Discharge Plan: [place]   Discharge Planning Services: CM Consult Post Acute Care Choice: [place] Living arrangements for the past 2 months: Single Family Home                                       Social Determinants of Health (SDOH) Interventions    Readmission Risk Interventions No flowsheet data found.

## 2021-01-28 NOTE — Care Management Important Message (Signed)
Medicare IM printed remotely for Social Work team to give to the patient. 

## 2021-01-28 NOTE — Progress Notes (Signed)
Called patient's daughter Lori Rodriguez for update but went to the voice mail.

## 2021-01-29 ENCOUNTER — Encounter (HOSPITAL_COMMUNITY): Payer: Self-pay | Admitting: Orthopedic Surgery

## 2021-01-29 DIAGNOSIS — F32A Depression, unspecified: Secondary | ICD-10-CM | POA: Diagnosis not present

## 2021-01-29 DIAGNOSIS — E785 Hyperlipidemia, unspecified: Secondary | ICD-10-CM | POA: Diagnosis not present

## 2021-01-29 DIAGNOSIS — Z853 Personal history of malignant neoplasm of breast: Secondary | ICD-10-CM | POA: Diagnosis not present

## 2021-01-29 DIAGNOSIS — R141 Gas pain: Secondary | ICD-10-CM | POA: Diagnosis not present

## 2021-01-29 DIAGNOSIS — R82998 Other abnormal findings in urine: Secondary | ICD-10-CM | POA: Diagnosis not present

## 2021-01-29 DIAGNOSIS — E119 Type 2 diabetes mellitus without complications: Secondary | ICD-10-CM | POA: Diagnosis not present

## 2021-01-29 DIAGNOSIS — M810 Age-related osteoporosis without current pathological fracture: Secondary | ICD-10-CM | POA: Diagnosis not present

## 2021-01-29 DIAGNOSIS — R42 Dizziness and giddiness: Secondary | ICD-10-CM | POA: Diagnosis not present

## 2021-01-29 DIAGNOSIS — Z471 Aftercare following joint replacement surgery: Secondary | ICD-10-CM | POA: Diagnosis not present

## 2021-01-29 DIAGNOSIS — G4700 Insomnia, unspecified: Secondary | ICD-10-CM | POA: Diagnosis not present

## 2021-01-29 DIAGNOSIS — S728X1A Other fracture of right femur, initial encounter for closed fracture: Secondary | ICD-10-CM | POA: Diagnosis not present

## 2021-01-29 DIAGNOSIS — E782 Mixed hyperlipidemia: Secondary | ICD-10-CM | POA: Diagnosis not present

## 2021-01-29 DIAGNOSIS — I4891 Unspecified atrial fibrillation: Secondary | ICD-10-CM | POA: Diagnosis not present

## 2021-01-29 DIAGNOSIS — R0602 Shortness of breath: Secondary | ICD-10-CM | POA: Diagnosis not present

## 2021-01-29 DIAGNOSIS — R5381 Other malaise: Secondary | ICD-10-CM | POA: Diagnosis not present

## 2021-01-29 DIAGNOSIS — R110 Nausea: Secondary | ICD-10-CM | POA: Diagnosis not present

## 2021-01-29 DIAGNOSIS — G629 Polyneuropathy, unspecified: Secondary | ICD-10-CM | POA: Diagnosis not present

## 2021-01-29 DIAGNOSIS — M9701XA Periprosthetic fracture around internal prosthetic right hip joint, initial encounter: Secondary | ICD-10-CM | POA: Diagnosis not present

## 2021-01-29 DIAGNOSIS — F5102 Adjustment insomnia: Secondary | ICD-10-CM | POA: Diagnosis not present

## 2021-01-29 DIAGNOSIS — M2550 Pain in unspecified joint: Secondary | ICD-10-CM | POA: Diagnosis not present

## 2021-01-29 DIAGNOSIS — M9701XD Periprosthetic fracture around internal prosthetic right hip joint, subsequent encounter: Secondary | ICD-10-CM | POA: Diagnosis not present

## 2021-01-29 DIAGNOSIS — N390 Urinary tract infection, site not specified: Secondary | ICD-10-CM | POA: Diagnosis not present

## 2021-01-29 DIAGNOSIS — R21 Rash and other nonspecific skin eruption: Secondary | ICD-10-CM | POA: Diagnosis not present

## 2021-01-29 DIAGNOSIS — Z96641 Presence of right artificial hip joint: Secondary | ICD-10-CM | POA: Diagnosis not present

## 2021-01-29 DIAGNOSIS — M109 Gout, unspecified: Secondary | ICD-10-CM | POA: Diagnosis not present

## 2021-01-29 DIAGNOSIS — F4323 Adjustment disorder with mixed anxiety and depressed mood: Secondary | ICD-10-CM | POA: Diagnosis not present

## 2021-01-29 DIAGNOSIS — I2510 Atherosclerotic heart disease of native coronary artery without angina pectoris: Secondary | ICD-10-CM | POA: Diagnosis not present

## 2021-01-29 DIAGNOSIS — I10 Essential (primary) hypertension: Secondary | ICD-10-CM | POA: Diagnosis not present

## 2021-01-29 DIAGNOSIS — N1831 Chronic kidney disease, stage 3a: Secondary | ICD-10-CM | POA: Diagnosis not present

## 2021-01-29 DIAGNOSIS — Z7901 Long term (current) use of anticoagulants: Secondary | ICD-10-CM | POA: Diagnosis not present

## 2021-01-29 DIAGNOSIS — Z7401 Bed confinement status: Secondary | ICD-10-CM | POA: Diagnosis not present

## 2021-01-29 LAB — GLUCOSE, CAPILLARY
Glucose-Capillary: 296 mg/dL — ABNORMAL HIGH (ref 70–99)
Glucose-Capillary: 292 mg/dL — ABNORMAL HIGH (ref 70–99)
Glucose-Capillary: 277 mg/dL — ABNORMAL HIGH (ref 70–99)

## 2021-01-29 LAB — HEMOGLOBIN AND HEMATOCRIT, BLOOD
HCT: 26.3 % — ABNORMAL LOW (ref 36.0–46.0)
Hemoglobin: 8.3 g/dL — ABNORMAL LOW (ref 12.0–15.0)

## 2021-01-29 MED ORDER — LABETALOL HCL 200 MG PO TABS
100.0000 mg | ORAL_TABLET | Freq: Two times a day (BID) | ORAL | Status: DC
Start: 2021-01-29 — End: 2021-06-05

## 2021-01-29 MED ORDER — DILTIAZEM HCL ER COATED BEADS 360 MG PO CP24: 360.0000 mg | ORAL_CAPSULE | Freq: Every day | ORAL | Status: DC

## 2021-01-29 MED ORDER — DILTIAZEM HCL ER COATED BEADS 180 MG PO CP24
360.0000 mg | ORAL_CAPSULE | Freq: Every day | ORAL | Status: DC
  Administered 2021-01-29: 360 mg via ORAL
  Filled 2021-01-29: qty 2

## 2021-01-29 MED ORDER — POLYETHYLENE GLYCOL 3350 17 G PO PACK: 17.0000 g | PACK | Freq: Every day | ORAL | 0 refills | Status: DC

## 2021-01-29 MED ORDER — INSULIN GLARGINE 100 UNIT/ML ~~LOC~~ SOLN: 25.0000 [IU] | Freq: Every day | SUBCUTANEOUS | Status: DC

## 2021-01-29 NOTE — Progress Notes (Signed)
Patient now on Prevena portable VAC. Report given to Algis Greenhouse at [facility]. CSW informed and will call [facility] for transportation.

## 2021-01-29 NOTE — Progress Notes (Signed)
Patient incisional vacuum should be converted to Prevena portable vacuum at discharge. Lori Rodriguez 1:55 PM 01/29/21 [id number]

## 2021-01-29 NOTE — NC FL2 (Signed)
[place] LEVEL OF CARE SCREENING TOOL     IDENTIFICATION  Patient Name: Lori Rodriguez Birthdate: [birth year] Sex: female Admission Date (Current Location): 01/21/2021  [facility] and [place] Number:  Herbalist and Address:  [facility],  [place] [address] [place]      Provider Number: [id number]  Attending Physician Name and Address:  Shelly Coss, MD  Relative Name and Phone Number:  Navia Lindahl) [phone]    Current Level of Care: Hospital Recommended Level of Care: [place] Prior Approval Number:    Date Approved/Denied:   PASRR Number: [id number]  Discharge Plan: SNF    Current Diagnoses: Patient Active Problem List   Diagnosis Date Noted  . Pressure injury of skin 01/27/2021  . Femur fracture ([place]) 01/21/2021  . Femur fracture ([place]) 01/21/2021  . Anemia   . Anginal pain ([place])   . Atrial fibrillation ([place])   . Cancer ([place])   . CHF (congestive heart failure) ([place])   . Chronic back pain   . Chronic kidney disease   . Diabetes mellitus without complication ([place])   . DJD (degenerative joint disease)   . Dysrhythmia   . Hypertension   . Osteoporosis   . Right renal mass   . Vitamin B12 deficiency   . Vitamin D deficiency   . Orthostatic hypotension 12/01/2018  . Secondary pulmonary arterial hypertension ([place]) 11/03/2018  . Estrogen receptor positive neoplasm 03/10/2016  . Cancer of central portion of left female breast ([place]) 03/10/2016  . Type 2 diabetes mellitus ([place]) 02/29/2016  . Mixed hyperlipidemia 02/29/2016  . Long term (current) use of anticoagulants 02/29/2016  . Hypertensive heart disease 02/28/2016  . Persistent atrial fibrillation ([place]) 02/28/2016  . Encounter for preprocedural cardiovascular examination 02/28/2016    Orientation RESPIRATION BLADDER Height & Weight     Self,Time,Situation,Place  Normal Indwelling catheter Weight: 181 lb 7 oz (82.3  kg) Height:  5\' 7"  (170.2 cm)  BEHAVIORAL SYMPTOMS/MOOD NEUROLOGICAL BOWEL NUTRITION STATUS      Continent Diet (CHO MOD)  AMBULATORY STATUS COMMUNICATION OF NEEDS Skin   Limited Assist Verbally Surgical wounds,Wound Vac,PU Stage and Appropriate Care (If Prevena becomes dislodged leave wound vac off and cover in dry sterile dressing.)   PU Stage 2 Dressing:  (PRN dressing change)                   Personal Care Assistance Level of Assistance  Bathing,Feeding,Dressing Bathing Assistance: Limited assistance Feeding assistance: Limited assistance Dressing Assistance: Limited assistance     Functional Limitations Info  Sight,Speech,Hearing Sight Info: Impaired (eyeglasses) Hearing Info: Adequate Speech Info: Impaired (Dentures-top/bottom)    SPECIAL CARE FACTORS FREQUENCY  PT (By licensed PT),OT (By licensed OT)     PT Frequency: Minimum 5x a week (5x week) OT Frequency: Minimum 5x a week (5x week)            Contractures Contractures Info: Not present    Additional Factors Info  Code Status,Allergies,Psychotropic Code Status Info: Full Code (Full) Allergies Info: Lisinopril   Percocet Valium Prednisone Psychotropic Info: sertraline (ZOLOFT) tablet 100 mg (Lisinopril, Percocet (Oxycodone-acetaminophen), Valium (Diazepam), Prednisone)         Current Medications (01/29/2021):  This is the current hospital active medication list Current Facility-Administered Medications  Medication Dose Route Frequency Provider Last Rate Last Admin  . acetaminophen (TYLENOL) tablet 650 mg  650 mg Oral Q6H PRN Rod Can, MD   650 mg at 01/27/21  2237   Or  . acetaminophen (TYLENOL) suppository 650 mg  650 mg Rectal Q6H PRN Swinteck, Aaron Edelman, MD      . acetaminophen (TYLENOL) tablet 325-650 mg  325-650 mg Oral Q6H PRN Swinteck, Aaron Edelman, MD      . Chlorhexidine Gluconate Cloth 2 % PADS 6 each  6 each Topical Daily Shelly Coss, MD   6 each at 01/26/21 1000  . diltiazem (CARDIZEM CD)  24 hr capsule 360 mg  360 mg Oral Daily Shelly Coss, MD   360 mg at 01/29/21 1006  . docusate sodium (COLACE) capsule 100 mg  100 mg Oral BID Rod Can, MD   100 mg at 01/29/21 1006  . HYDROcodone-acetaminophen (NORCO) 7.5-325 MG per tablet 1-2 tablet  1-2 tablet Oral Q4H PRN Rod Can, MD   1 tablet at 01/29/21 1527  . HYDROcodone-acetaminophen (NORCO/VICODIN) 5-325 MG per tablet 1-2 tablet  1-2 tablet Oral Q4H PRN Rod Can, MD   1 tablet at 01/29/21 0738  . insulin aspart (novoLOG) injection 0-15 Units  0-15 Units Subcutaneous TID WC Rod Can, MD   8 Units at 01/29/21 1700  . insulin aspart (novoLOG) injection 0-5 Units  0-5 Units Subcutaneous QHS Rod Can, MD   3 Units at 01/28/21 2221  . [START ON 01/30/2021] insulin glargine (LANTUS) injection 25 Units  25 Units Subcutaneous Daily Adhikari, Amrit, MD      . labetalol (NORMODYNE) tablet 100 mg  100 mg Oral BID Rod Can, MD   100 mg at 01/29/21 1006  . menthol-cetylpyridinium (CEPACOL) lozenge 3 mg  1 lozenge Oral PRN Swinteck, Aaron Edelman, MD       Or  . phenol (CHLORASEPTIC) mouth spray 1 spray  1 spray Mouth/Throat PRN Swinteck, Aaron Edelman, MD      . metoCLOPramide (REGLAN) tablet 5-10 mg  5-10 mg Oral Q8H PRN Swinteck, Aaron Edelman, MD       Or  . metoCLOPramide (REGLAN) injection 5-10 mg  5-10 mg Intravenous Q8H PRN Swinteck, Aaron Edelman, MD      . metoprolol tartrate (LOPRESSOR) injection 5 mg  5 mg Intravenous Q6H PRN Rod Can, MD   5 mg at 01/23/21 1700  . morphine 4 MG/ML injection 0.52-1 mg  0.52-1 mg Intravenous Q2H PRN Rod Can, MD   1 mg at 01/25/21 0751  . ondansetron (ZOFRAN) tablet 4 mg  4 mg Oral Q6H PRN Swinteck, Aaron Edelman, MD       Or  . ondansetron (ZOFRAN) injection 4 mg  4 mg Intravenous Q6H PRN Rod Can, MD   4 mg at 01/25/21 1426  . polyethylene glycol (MIRALAX / GLYCOLAX) packet 17 g  17 g Oral Daily Shelly Coss, MD   17 g at 01/29/21 1005  . rivaroxaban (XARELTO) tablet 20 mg   20 mg Oral Q supper Rod Can, MD   20 mg at 01/29/21 1701  . senna (SENOKOT) tablet 8.6 mg  1 tablet Oral BID Rod Can, MD   8.6 mg at 01/29/21 1006  . sertraline (ZOLOFT) tablet 100 mg  100 mg Oral Daily Rod Can, MD   100 mg at 01/29/21 1006     Discharge Medications: Please see discharge summary for a list of discharge medications.  Relevant Imaging Results:  Relevant Lab Results:   Additional Information SSN [id number]  Schedule appointment for follow up outpatient in one week for Tuesday Feb 05, 2021 to have wound vac removed at Dr. Rod Can, [address]  [address]  [place] [address]  [phone]  Ross Ludwig, LCSW

## 2021-01-29 NOTE — Progress Notes (Signed)
Called the ortho in reference to patient wound VAC, and was told by the PA- Drue Dun that patient is suppose to be on the [place] dressing-VAC for 7days and F/U with MD outpatient, informed her that the patient his on a regular hospital VAC. She stated she will inform the PA that will F/U on that. I spoke to Lowella Dandy who is making arrangement to get the [facility]. Tommy from [facility] called and will be bring it up and he will F/U by 3:15. CSW- Randall Hiss updated

## 2021-01-29 NOTE — Progress Notes (Signed)
Inpatient Diabetes Program Recommendations  AACE/ADA: New Consensus Statement on Inpatient Glycemic Control (2015)  Target Ranges:  Prepandial:   less than 140 mg/dL      Peak postprandial:   less than 180 mg/dL (1-2 hours)      Critically ill patients:  140 - 180 mg/dL   Lab Results  Component Value Date   GLUCAP 292 (H) 01/29/2021   HGBA1C 7.1 (H) 01/21/2021    Review of Glycemic Control Results for Lori Rodriguez, Lori Rodriguez (MRN [id number]) as of 01/29/2021 12:04  Ref. Range 01/28/2021 11:17 01/28/2021 16:21 01/28/2021 21:23 01/29/2021 07:40 01/29/2021 11:56  Glucose-Capillary Latest Ref Range: 70 - 99 mg/dL 254 (H) 238 (H) 274 (H) 296 (H) 292 (H)    Inpatient Diabetes Program Recommendations:     Please consider increasing Lantus to 25 units QD.    Will continue to follow while inpatient.  Thank you, Reche Dixon, RN, BSN Diabetes Coordinator Inpatient Diabetes Program [phone] (team pager from 8a-5p)

## 2021-01-29 NOTE — Discharge Summary (Addendum)
Physician Discharge Summary  Lori Rodriguez [id number] DOB: 09-25-1934 DOA: 01/21/2021  PCP: Penelope Coop, FNP  Admit date: 01/21/2021 Discharge date: 01/29/2021  Admitted From: Home Disposition:  Home  Discharge Condition:Stable CODE STATUS:FULL Diet recommendation: Heart Healthy  Brief/Interim Summary:  Patient is a 86 year old female with history of paroxysmal A. fib on anticoagulation with Xarelto, CKD status 3A, diabetes type 2, hypertension, hyperlipidemia, history of breast cancer, diastolic dysfunction who was sent from [facility] for the management of right nondisplaced fracture of her prosthetic hip as per the report she had a mechanical fall when she slipped over a rug.  Orthopedics did ORIF on 01/24/21.  Hospital course remarkable for A. fib with RVR, intermittent fever, now stable.  PT/OT recommended skilled nursing facility on discharge.  Patient is medically stable for discharge as soon as bed is available.  Following problems were addressed during her hospitalization:   Right femoral periprosthetic fracture: Transferred here from [facility] for orthopedic management.    Status post ORIF on 01/24/2021. PT/OT consulted.  Recommendation was SNF on discharge.  She also has right abductor brace . She will follow-up with orthopedics as an outpatient. She has Prevena portable vacuum at discharge, the vacuum shd remain  as it is and she shd follow-up with orthopedics as an outpatient in 1 week.  A. fib with RVR: Takes labetalol for rate control, on Xarelto for anticoagulation.  She was on A. fib with RVR after admission here.  Started heparin drip for anticoagulation,now stopped.  Xarelto  restarted. Also had to be started on Cardizem drip for A. fib with RVR.  Monitor on telemetry.  Heart rate is well controlled today.  Continue  oral Cardizem  Diabetes type 2: Monitor blood sugars.   Recent hemoglobin A1c of 7.1.  Resume home regimen on  discharge.  Hyperlipidemia: Continue statin  CKD stage IIIa: Currently kidney function at baseline  Coronary artery disease: Denies any chest pain.  Currently stable.  Echocardiogram showed EF of 55 to 60%, indeterminate left ventricular diastolic parameter, mildly reduced right ventricular systolic function, moderately elevated pulmonary artery pressure .follows with Dr Bettina Gavia  History of breast cancer:   We recommend oncology follow-up as an outpatient  Hypertension: Currently blood pressure stable.    Leukocytosis/intermittent fever: Had mild  leukocytosis,improved.  She has spiked mild grade fever intermittently,now afebrile  .    Blood  culture,NGTD.  Urine culture showed multiple species patient has history of UTI in the past and she was having some dysuria.  Treated with 3 days course of  ceftriaxone .  Chest x-ray did not show any pneumonia.  Confusion: She was intermittently confused during this hospitalization.  Initially thought to be from UTI, urine culture showed multiple species, she also finished  3 days of abx.  Due to her advanced age, I suspect she might have some underlying dementia.  Most of the time, she remains alert and awake, obeys commands and is not agitated.  History of depression: On Zoloft       Discharge Diagnoses:  Principal Problem:   Femur fracture ([place]) Active Problems:   Mixed hyperlipidemia   Long term (current) use of anticoagulants   Atrial fibrillation (HCC)   Chronic kidney disease   Diabetes mellitus without complication (HCC)   Pressure injury of skin    Discharge Instructions  Discharge Instructions    Diet - low sodium heart healthy   Complete by: As directed    Discharge instructions   Complete by:  As directed    1)Please take prescribed medications as instructed 2)Follow up with orthopedics as an outpatient in 2 weeks. Number  and number of the provider has been attached.   Increase activity slowly   Complete by: As  directed    No wound care   Complete by: As directed      Allergies as of 01/29/2021      Reactions   Lisinopril Cough   Percocet [oxycodone-acetaminophen] Other (See Comments)   Hallucinations   Valium [diazepam] Other (See Comments)   Confusion   Prednisone Anxiety, Other (See Comments)   Dizziness, weakness      Medication List    STOP taking these medications   acetaminophen 650 MG CR tablet Commonly known as: TYLENOL Replaced by: acetaminophen 325 MG tablet   traMADol 50 MG tablet Commonly known as: ULTRAM     TAKE these medications   acetaminophen 325 MG tablet Commonly known as: TYLENOL Take 1-2 tablets (325-650 mg total) by mouth every 6 (six) hours as needed for mild pain (pain score 1-3 or temp > 100.5). Replaces: acetaminophen 650 MG CR tablet   albuterol 108 (90 Base) MCG/ACT inhaler Commonly known as: VENTOLIN HFA Inhale 2 puffs into the lungs 4 (four) times daily as needed for wheezing or shortness of breath.   Calcium-Vitamin D3 600-125 MG-UNIT Tabs Take 1 capsule by mouth daily.   diltiazem 360 MG 24 hr capsule Commonly known as: CARDIZEM CD Take 1 capsule (360 mg total) by mouth daily. Start taking on: Jan 30, 2021   ferrous sulfate 325 (65 FE) MG tablet Take 325 mg by mouth every other day.   fluticasone furoate-vilanterol 100-25 MCG/INH Aepb Commonly known as: BREO ELLIPTA Place 1 puff into the nose daily.   furosemide 20 MG tablet Commonly known as: LASIX Take 20 mg by mouth daily. Can take second tablet in the evening if weight increases more than 3 lbs   glipiZIDE 5 MG 24 hr tablet Commonly known as: GLUCOTROL XL Take 5 mg by mouth 2 (two) times daily.   HYDROcodone-acetaminophen 7.5-325 MG tablet Commonly known as: NORCO Take 1-2 tablets by mouth every 4 (four) hours as needed for up to 7 days for severe pain (pain score 7-10).   labetalol 200 MG tablet Commonly known as: NORMODYNE Take 0.5 tablets (100 mg total) by mouth 2  (two) times daily.   metFORMIN 1000 MG tablet Commonly known as: GLUCOPHAGE Take 1,000 mg by mouth 2 (two) times daily with a meal.   polyethylene glycol 17 g packet Commonly known as: MIRALAX / GLYCOLAX Take 17 g by mouth daily. Start taking on: Jan 30, 2021   rosuvastatin 40 MG tablet Commonly known as: CRESTOR Take 40 mg by mouth daily.   sertraline 100 MG tablet Commonly known as: ZOLOFT Take 100 mg by mouth daily.   tolterodine 4 MG 24 hr capsule Commonly known as: DETROL LA Take 4 mg by mouth every morning.   Vitamin B-12 5000 MCG Tbdp Take 5,000 mcg by mouth daily. Take 2 tablets daily   Vitamin D (Ergocalciferol) 1.25 MG (50000 UNIT) Caps capsule Commonly known as: DRISDOL Take 50,000 Units by mouth once a week.   Xarelto 20 MG Tabs tablet Generic drug: rivaroxaban TAKE ONE TABLET BY MOUTH AT BEDTIME What changed:   how much to take  when to take this       Follow-up Information    Swinteck, Aaron Edelman, MD. Schedule an appointment as soon as possible for a  visit in 2 week(s).   Specialty: Orthopedic Surgery Contact information: [address] [place] [address] [id number]              Allergies  Allergen Reactions  . Lisinopril Cough  . Percocet [Oxycodone-Acetaminophen] Other (See Comments)    Hallucinations  . Valium [Diazepam] Other (See Comments)    Confusion  . Prednisone Anxiety and Other (See Comments)     Dizziness, weakness     Consultations:  Orthopedics   Procedures/Studies: DG CHEST PORT 1 VIEW  Result Date: 01/23/2021 CLINICAL DATA:  Fever EXAM: PORTABLE CHEST 1 VIEW COMPARISON:  01/19/2021 FINDINGS: Lungs are clear. Chin overlies the lung apices, however, no definite pneumothorax identified. No pleural effusion. Mild cardiomegaly is stable. Hilar enlargement is in keeping with pulmonary arterial enlargement centrally, better appreciated on a CT examination of 02/21/2020 in keeping with changes of pulmonary  arterial hypertension. Pulmonary vascularity is otherwise normal. No acute bone abnormality. IMPRESSION: Stable cardiomegaly and central pulmonary arterial enlargement. Electronically Signed   By: Fidela Salisbury MD   On: 01/23/2021 20:33   DG C-Arm 1-60 Min-No Report  Result Date: 01/24/2021 Fluoroscopy was utilized by the requesting physician.  No radiographic interpretation.   ECHOCARDIOGRAM COMPLETE  Result Date: 01/23/2021    ECHOCARDIOGRAM REPORT   Patient Name:   Blaine MARIE ROSE [facility] Date of Exam: 01/23/2021 Medical Rec #:  [id number]                Height:       67.0 in Accession #:    [id number]               Weight:       181.4 lb Date of Birth:  26-Dec-1934                BSA:          1.940 m Patient Age:    23 years                 BP:           152/73 mmHg Patient Gender: F                        HR:           88 bpm. Exam Location:  Inpatient Procedure: 2D Echo, Cardiac Doppler and Color Doppler Indications:    Pre-op cardiac evaluation  History:        Patient has prior history of Echocardiogram examinations, most                 recent 05/14/2020. CHF, Pulmonary HTN, Arrythmias:Atrial                 Fibrillation; Risk Factors:Diabetes, Hypertension and                 Dyslipidemia.  Sonographer:    Luisa Hart RDCS Referring Phys: [place]  1. Left ventricular ejection fraction, by estimation, is 55 to 60%. The left ventricle has normal function. The left ventricle has no regional wall motion abnormalities. Left ventricular diastolic parameters are indeterminate.  2. Right ventricular systolic function is mildly reduced. The right ventricular size is mildly enlarged. There is moderately elevated pulmonary artery systolic pressure.  3. Left atrial size was moderately dilated.  4. Right atrial size was moderately dilated.  5. The mitral valve is degenerative. Trivial mitral valve regurgitation. No evidence of mitral  stenosis.  6. The aortic valve was not well visualized.  There is mild calcification of the aortic valve. Aortic valve regurgitation is not visualized. Mild aortic valve sclerosis is present, with no evidence of aortic valve stenosis.  7. The inferior vena cava is normal in size with greater than 50% respiratory variability, suggesting right atrial pressure of 3 mmHg. FINDINGS  Left Ventricle: Left ventricular ejection fraction, by estimation, is 55 to 60%. The left ventricle has normal function. The left ventricle has no regional wall motion abnormalities. The left ventricular internal cavity size was normal in size. There is  no left ventricular hypertrophy. Left ventricular diastolic parameters are indeterminate. Right Ventricle: The right ventricular size is mildly enlarged. Right vetricular wall thickness was not assessed. Right ventricular systolic function is mildly reduced. There is moderately elevated pulmonary artery systolic pressure. The tricuspid regurgitant velocity is 3.31 m/s, and with an assumed right atrial pressure of 3 mmHg, the estimated right ventricular systolic pressure is [id number]. Left Atrium: Left atrial size was moderately dilated. Right Atrium: Right atrial size was moderately dilated. Pericardium: There is no evidence of pericardial effusion. Mitral Valve: The mitral valve is degenerative in appearance. There is mild thickening of the mitral valve leaflet(s). There is mild calcification of the mitral valve leaflet(s). Mild mitral annular calcification. Trivial mitral valve regurgitation. No evidence of mitral valve stenosis. Tricuspid Valve: The tricuspid valve is normal in structure. Tricuspid valve regurgitation is mild . No evidence of tricuspid stenosis. Aortic Valve: The aortic valve was not well visualized. There is mild calcification of the aortic valve. Aortic valve regurgitation is not visualized. Mild aortic valve sclerosis is present, with no evidence of aortic valve stenosis. Aortic valve mean gradient measures 3.5 mmHg. Aortic  valve peak gradient measures 7.1 mmHg. Aortic valve area, by VTI measures 2.44 cm. Pulmonic Valve: The pulmonic valve was normal in structure. Pulmonic valve regurgitation is not visualized. No evidence of pulmonic stenosis. Aorta: The aortic root is normal in size and structure. Venous: The inferior vena cava is normal in size with greater than 50% respiratory variability, suggesting right atrial pressure of 3 mmHg. IAS/Shunts: No atrial level shunt detected by color flow Doppler.  LEFT VENTRICLE PLAX 2D LVIDd:         5.30 cm     Diastology LVIDs:         3.70 cm     LV e' medial:    5.00 cm/s LV PW:         1.20 cm     LV E/e' medial:  31.4 LV IVS:        0.90 cm     LV e' lateral:   6.09 cm/s LVOT diam:     1.90 cm     LV E/e' lateral: 25.8 LV SV:         51 LV SV Index:   26 LVOT Area:     2.84 cm  LV Volumes (MOD) LV vol d, MOD A2C: 39.6 ml LV vol d, MOD A4C: 54.8 ml LV vol s, MOD A2C: 18.5 ml LV vol s, MOD A4C: 20.4 ml LV SV MOD A2C:     21.1 ml LV SV MOD A4C:     54.8 ml LV SV MOD BP:      27.9 ml RIGHT VENTRICLE RV S prime:     8.92 cm/s TAPSE (M-mode): 1.2 cm LEFT ATRIUM             Index  RIGHT ATRIUM           Index LA diam:        4.60 cm 2.37 cm/m  RA Area:     24.00 cm LA Vol (A2C):   47.3 ml 24.38 ml/m RA Volume:   67.20 ml  34.63 ml/m LA Vol (A4C):   50.7 ml 26.13 ml/m LA Biplane Vol: 50.4 ml 25.98 ml/m  AORTIC VALVE                   PULMONIC VALVE AV Area (Vmax):    2.29 cm    PV Vmax:       1.03 m/s AV Area (Vmean):   2.34 cm    PV Vmean:      79.000 cm/s AV Area (VTI):     2.44 cm    PV VTI:        0.189 m AV Vmax:           133.50 cm/s PV Peak grad:  4.2 mmHg AV Vmean:          92.100 cm/s PV Mean grad:  3.0 mmHg AV VTI:            0.209 m AV Peak Grad:      7.1 mmHg AV Mean Grad:      3.5 mmHg LVOT Vmax:         108.00 cm/s LVOT Vmean:        75.900 cm/s LVOT VTI:          0.180 m LVOT/AV VTI ratio: 0.86  AORTA Ao Root diam: 3.30 cm Ao Asc diam:  3.00 cm MITRAL VALVE                 TRICUSPID VALVE MV Area (PHT): 5.02 cm     TR Peak grad:   43.8 mmHg MV Decel Time: 151 msec     TR Vmax:        331.00 cm/s MR Peak grad: 79.9 mmHg MR Vmax:      447.00 cm/s   SHUNTS MV E velocity: 157.00 cm/s  Systemic VTI:  0.18 m                             Systemic Diam: 1.90 cm Jenkins Rouge MD Electronically signed by Jenkins Rouge MD Signature Date/Time: 01/23/2021/2:20:48 PM    Final    DG HIP PORT UNILAT WITH PELVIS 1V RIGHT  Result Date: 01/24/2021 CLINICAL DATA:  Right hip arthroplasty revision EXAM: DG HIP (WITH OR WITHOUT PELVIS) 1V PORT RIGHT COMPARISON:  01/19/2021 FINDINGS: Frontal view of the pelvis as well as a frontal view of the right hip are obtained. There has been revision of the femoral component of the right hip arthroplasty, with long stem component and cerclage wires now traversing the proximal femoral subtrochanteric fracture seen previously. Alignment is near anatomic. Postsurgical changes are seen in the soft tissues. IMPRESSION: 1. Revision of the right hip arthroplasty as above, with femoral component now traversing the subtrochanteric femoral fracture seen previously. Near anatomic alignment. Electronically Signed   By: Randa Ngo M.D.   On: 01/24/2021 16:06   DG HIP OPERATIVE UNILAT W OR W/O PELVIS RIGHT  Result Date: 01/24/2021 CLINICAL DATA:  Total right hip revision. EXAM: OPERATIVE right HIP (WITH PELVIS IF PERFORMED)  VIEWS TECHNIQUE: Fluoroscopic spot image(s) were submitted for interpretation post-operatively. COMPARISON:  Jan 22, 2021. FINDINGS: A single C-arm fluoroscopic images less  intraoperatively and submitted for post operative interpretation. This image demonstrates hip arthroplasty with cerclage wires. The superior most and inferior most aspect of the hardware is not imaged. Alignment at the hip is difficult to evaluate on this single image. Please see the performing provider's procedural report for further detail. IMPRESSION: Intraoperative fluoroscopy,  as detailed above. Electronically Signed   By: Margaretha Sheffield MD   On: 01/24/2021 14:35   DG FEMUR PORT, MIN 2 VIEWS RIGHT  Result Date: 01/22/2021 CLINICAL DATA:  Fall. EXAM: RIGHT FEMUR PORTABLE 2 VIEW COMPARISON:  01/19/2021. FINDINGS: ORIF right hip. Known right subtrochanteric hip fracture stable. No new bony abnormality is identified. Distal femur is intact. Degenerative changes right knee. Tiny joint effusion. Peripheral vascular calcification. IMPRESSION: ORIF right hip. Known right subtrochanteric hip fractures stable. No new bony abnormality identified. 2.  Degenerative changes right knee. 3.  Tiny knee joint effusion. 4.  Peripheral vascular disease. Electronically Signed   By: Marcello Moores  Register   On: 01/22/2021 13:59      Subjective:  Patient seen and examined at bedside this morning.  Hemodynamically stable for discharge today.  Discharge Exam: Vitals:   01/29/21 0400 01/29/21 1006  BP: (!) 148/72 (!) 148/74  Pulse:    Resp: 15   Temp: 98.1 F (36.7 C)   SpO2:     Vitals:   01/28/21 1300 01/28/21 2222 01/29/21 0400 01/29/21 1006  BP: (!) 130/56 (!) 155/98 (!) 148/72 (!) 148/74  Pulse: 83 100    Resp: (!) 21 20 15    Temp: 97.9 F (36.6 C) 98.3 F (36.8 C) 98.1 F (36.7 C)   TempSrc: Oral Oral Oral   SpO2: 96% 95%    Weight:      Height:        General: Pt is alert, awake, not in acute distress Cardiovascular: Irregularly irregular rhythm,, S1/S2 +, no rubs, no gallops Respiratory: CTA bilaterally, no wheezing, no rhonchi Abdominal: Soft, NT, ND, bowel sounds + Extremities: no edema, no cyanosis, surgical wound on the right hip    The results of significant diagnostics from this hospitalization (including imaging, microbiology, ancillary and laboratory) are listed below for reference.     Microbiology: Recent Results (from the past 240 hour(s))  Surgical PCR screen     Status: None   Collection Time: 01/21/21 10:54 PM   Specimen: Nasal Mucosa; Nasal  Swab  Result Value Ref Range Status   MRSA, PCR NEGATIVE NEGATIVE Final   Staphylococcus aureus NEGATIVE NEGATIVE Final    Comment: (NOTE) The Xpert SA Assay (FDA approved for NASAL specimens in patients 63 years of age and older), is one component of a comprehensive surveillance program. It is not intended to diagnose infection nor to guide or monitor treatment. Performed at [facility], [place] [address] [place], [place] [address]   Culture, blood (routine x 2)     Status: None   Collection Time: 01/23/21  8:44 AM   Specimen: BLOOD  Result Value Ref Range Status   Specimen Description   Final    BLOOD LEFT ANTECUBITAL Performed at [place] [address], [place], [place] [address]    Special Requests   Final    BOTTLES DRAWN AEROBIC AND ANAEROBIC Blood Culture results may not be optimal due to an excessive volume of blood received in culture bottles Performed at [place] [address] [place], [place] [address]    Culture   Final    NO  GROWTH 5 DAYS Performed at [facility], [place] [address] [place], [place] [address]    Report Status 01/28/2021 FINAL  Final  Culture, blood (routine x 2)     Status: None   Collection Time: 01/23/21  8:49 AM   Specimen: BLOOD  Result Value Ref Range Status   Specimen Description   Final    BLOOD BLOOD RIGHT HAND Performed at [place] [address] [place], [place] [address]    Special Requests   Final    BOTTLES DRAWN AEROBIC AND ANAEROBIC Blood Culture adequate volume Performed at [place] [address] [place], [place] [address]    Culture   Final    NO GROWTH 5 DAYS Performed at [facility], [place] [address], [place], [place] [address]    Report Status 01/28/2021 FINAL  Final  SARS Coronavirus 2 by RT PCR (hospital order, performed in [facility] hospital lab) Nasopharyngeal     Status: None    Collection Time: 01/23/21  1:26 PM   Specimen: Nasopharyngeal  Result Value Ref Range Status   SARS Coronavirus 2 NEGATIVE NEGATIVE Final    Comment: (NOTE) SARS-CoV-2 target nucleic acids are NOT DETECTED.  The SARS-CoV-2 RNA is generally detectable in upper and lower respiratory specimens during the acute phase of infection. The lowest concentration of SARS-CoV-2 viral copies this assay can detect is 250 copies / mL. A negative result does not preclude SARS-CoV-2 infection and should not be used as the sole basis for treatment or other patient management decisions.  A negative result may occur with improper specimen collection / handling, submission of specimen other than nasopharyngeal swab, presence of viral mutation(s) within the areas targeted by this assay, and inadequate number of viral copies (<250 copies / mL). A negative result must be combined with clinical observations, patient history, and epidemiological information.  Fact Sheet for Patients:   StrictlyIdeas.no  Fact Sheet for Healthcare Providers: BankingDealers.co.za  This test is not yet approved or  cleared by the [place] FDA and has been authorized for detection and/or diagnosis of SARS-CoV-2 by FDA under an Emergency Use Authorization (EUA).  This EUA will remain in effect (meaning this test can be used) for the duration of the COVID-19 declaration under Section 564(b)(1) of the Act, 21 U.S.C. section 360bbb-3(b)(1), unless the authorization is terminated or revoked sooner.  Performed at [facility], [place] [address] [place], [place] [address]   Culture, Urine     Status: Abnormal   Collection Time: 01/23/21  1:26 PM   Specimen: Urine, Random  Result Value Ref Range Status   Specimen Description   Final    URINE, RANDOM Performed at [place] [address], [place], [place] [address]    Special Requests    Final    NONE Performed at [facility], [place] [address] [place], [place] [address]    Culture MULTIPLE SPECIES PRESENT, SUGGEST RECOLLECTION (A)  Final   Report Status 01/25/2021 FINAL  Final  Resp Panel by RT-PCR (Flu A&B, Covid) Nasopharyngeal Swab     Status: None   Collection Time: 01/28/21 10:54 AM   Specimen: Nasopharyngeal Swab; Nasopharyngeal(NP) swabs in vial transport medium  Result Value Ref Range Status   SARS Coronavirus 2 by RT PCR NEGATIVE NEGATIVE Final    Comment: (NOTE) SARS-CoV-2 target nucleic acids are NOT DETECTED.  The SARS-CoV-2 RNA is generally detectable in upper respiratory specimens during  the acute phase of infection. The lowest concentration of SARS-CoV-2 viral copies this assay can detect is 138 copies/mL. A negative result does not preclude SARS-Cov-2 infection and should not be used as the sole basis for treatment or other patient management decisions. A negative result may occur with  improper specimen collection/handling, submission of specimen other than nasopharyngeal swab, presence of viral mutation(s) within the areas targeted by this assay, and inadequate number of viral copies(<138 copies/mL). A negative result must be combined with clinical observations, patient history, and epidemiological information. The expected result is Negative.  Fact Sheet for Patients:  EntrepreneurPulse.com.au  Fact Sheet for Healthcare Providers:  IncredibleEmployment.be  This test is no t yet approved or cleared by the [place] FDA and  has been authorized for detection and/or diagnosis of SARS-CoV-2 by FDA under an Emergency Use Authorization (EUA). This EUA will remain  in effect (meaning this test can be used) for the duration of the COVID-19 declaration under Section 564(b)(1) of the Act, 21 U.S.C.section 360bbb-3(b)(1), unless the authorization is terminated  or revoked sooner.        Influenza A by PCR NEGATIVE NEGATIVE Final   Influenza B by PCR NEGATIVE NEGATIVE Final    Comment: (NOTE) The Xpert Xpress SARS-CoV-2/FLU/RSV plus assay is intended as an aid in the diagnosis of influenza from Nasopharyngeal swab specimens and should not be used as a sole basis for treatment. Nasal washings and aspirates are unacceptable for Xpert Xpress SARS-CoV-2/FLU/RSV testing.  Fact Sheet for Patients: EntrepreneurPulse.com.au  Fact Sheet for Healthcare Providers: IncredibleEmployment.be  This test is not yet approved or cleared by the [place] FDA and has been authorized for detection and/or diagnosis of SARS-CoV-2 by FDA under an Emergency Use Authorization (EUA). This EUA will remain in effect (meaning this test can be used) for the duration of the COVID-19 declaration under Section 564(b)(1) of the Act, 21 U.S.C. section 360bbb-3(b)(1), unless the authorization is terminated or revoked.  Performed at [facility], [place] [address] [place], [place] [address]      Labs: BNP (last 3 results) No results for input(s): BNP in the last 8760 hours. Basic Metabolic Panel: Recent Labs  Lab 01/24/21 0344 01/25/21 0309 01/26/21 0404  NA 133* 133* 133*  K 4.0 4.1 4.1  CL 100 102 101  CO2 22 17* 21*  GLUCOSE 198* 215* 180*  BUN 23 27* 36*  CREATININE 0.72 0.56 1.03*  CALCIUM 8.7* 8.7* 8.8*   Liver Function Tests: No results for input(s): AST, ALT, ALKPHOS, BILITOT, PROT, ALBUMIN in the last 168 hours. No results for input(s): LIPASE, AMYLASE in the last 168 hours. No results for input(s): AMMONIA in the last 168 hours. CBC: Recent Labs  Lab 01/23/21 0421 01/24/21 0344 01/25/21 0309 01/26/21 0404 01/27/21 0357 01/28/21 0325 01/29/21 0355  WBC 11.6* 12.5* 14.7* 13.8* 13.6* 12.8*  --   NEUTROABS 8.5*  --   --  10.8* 11.0* 9.9*  --   HGB 12.4 11.1* 9.0* 8.5* 8.1* 7.7* 8.3*  HCT 37.7 34.7* 27.5* 26.5*  24.8* 24.0* 26.3*  MCV 88.7 91.1 90.5 89.2 89.2 89.2  --   PLT 146* 149* 156 165 233 285  --    Cardiac Enzymes: No results for input(s): CKTOTAL, CKMB, CKMBINDEX, TROPONINI in the last 168 hours. BNP: Invalid input(s): POCBNP CBG: Recent Labs  Lab 01/28/21 1117 01/28/21 1621 01/28/21 2123 01/29/21 0740 01/29/21 1156  GLUCAP 254* 238* 274* 296* 292*   D-Dimer No results for input(s): DDIMER  in the last 72 hours. Hgb A1c No results for input(s): HGBA1C in the last 72 hours. Lipid Profile No results for input(s): CHOL, HDL, LDLCALC, TRIG, CHOLHDL, LDLDIRECT in the last 72 hours. Thyroid function studies No results for input(s): TSH, T4TOTAL, T3FREE, THYROIDAB in the last 72 hours.  Invalid input(s): FREET3 Anemia work up No results for input(s): VITAMINB12, FOLATE, FERRITIN, TIBC, IRON, RETICCTPCT in the last 72 hours. Urinalysis    Component Value Date/Time   COLORURINE YELLOW 01/23/2021 1326   APPEARANCEUR CLEAR 01/23/2021 1326   LABSPEC 1.021 01/23/2021 1326   PHURINE 5.0 01/23/2021 1326   GLUCOSEU >=500 (A) 01/23/2021 1326   HGBUR NEGATIVE 01/23/2021 1326   BILIRUBINUR NEGATIVE 01/23/2021 1326   KETONESUR 5 (A) 01/23/2021 1326   PROTEINUR 30 (A) 01/23/2021 1326   NITRITE NEGATIVE 01/23/2021 1326   LEUKOCYTESUR NEGATIVE 01/23/2021 1326   Sepsis Labs Invalid input(s): PROCALCITONIN,  WBC,  LACTICIDVEN Microbiology Recent Results (from the past 240 hour(s))  Surgical PCR screen     Status: None   Collection Time: 01/21/21 10:54 PM   Specimen: Nasal Mucosa; Nasal Swab  Result Value Ref Range Status   MRSA, PCR NEGATIVE NEGATIVE Final   Staphylococcus aureus NEGATIVE NEGATIVE Final    Comment: (NOTE) The Xpert SA Assay (FDA approved for NASAL specimens in patients 81 years of age and older), is one component of a comprehensive surveillance program. It is not intended to diagnose infection nor to guide or monitor treatment. Performed at [facility], [place] [address], [place], [place] [address]   Culture, blood (routine x 2)     Status: None   Collection Time: 01/23/21  8:44 AM   Specimen: BLOOD  Result Value Ref Range Status   Specimen Description   Final    BLOOD LEFT ANTECUBITAL Performed at [facility] [address], [place], [place] [address]    Special Requests   Final    BOTTLES DRAWN AEROBIC AND ANAEROBIC Blood Culture results may not be optimal due to an excessive volume of blood received in culture bottles Performed at [facility] [address], [place], [place] [address]    Culture   Final    NO GROWTH 5 DAYS Performed at [facility], [place] [address], [place], [place] [address]    Report Status 01/28/2021 FINAL  Final  Culture, blood (routine x 2)     Status: None   Collection Time: 01/23/21  8:49 AM   Specimen: BLOOD  Result Value Ref Range Status   Specimen Description   Final    BLOOD BLOOD RIGHT HAND Performed at [facility] [address], [place], [place] [address]    Special Requests   Final    BOTTLES DRAWN AEROBIC AND ANAEROBIC Blood Culture adequate volume Performed at [facility] [address], [place], [place] [address]    Culture   Final    NO GROWTH 5 DAYS Performed at [facility], [place] [address] [place], [place] [address]    Report Status 01/28/2021 FINAL  Final  SARS Coronavirus 2 by RT PCR (hospital order, performed in [facility] hospital lab) Nasopharyngeal     Status: None   Collection Time: 01/23/21  1:26 PM   Specimen: Nasopharyngeal  Result Value Ref Range Status   SARS Coronavirus 2 NEGATIVE NEGATIVE Final    Comment: (NOTE) SARS-CoV-2 target nucleic acids are NOT DETECTED.  The SARS-CoV-2 RNA is generally  detectable in upper and lower respiratory specimens during the acute phase of infection. The lowest concentration of SARS-CoV-2 viral copies this assay can detect is  250 copies / mL. A negative result does not preclude SARS-CoV-2 infection and should not be used as the sole basis for treatment or other patient management decisions.  A negative result may occur with improper specimen collection / handling, submission of specimen other than nasopharyngeal swab, presence of viral mutation(s) within the areas targeted by this assay, and inadequate number of viral copies (<250 copies / mL). A negative result must be combined with clinical observations, patient history, and epidemiological information.  Fact Sheet for Patients:   StrictlyIdeas.no  Fact Sheet for Healthcare Providers: BankingDealers.co.za  This test is not yet approved or  cleared by the [place] FDA and has been authorized for detection and/or diagnosis of SARS-CoV-2 by FDA under an Emergency Use Authorization (EUA).  This EUA will remain in effect (meaning this test can be used) for the duration of the COVID-19 declaration under Section 564(b)(1) of the Act, 21 U.S.C. section 360bbb-3(b)(1), unless the authorization is terminated or revoked sooner.  Performed at [facility], [place] [address] [place], [place] [address]   Culture, Urine     Status: Abnormal   Collection Time: 01/23/21  1:26 PM   Specimen: Urine, Random  Result Value Ref Range Status   Specimen Description   Final    URINE, RANDOM Performed at [facility] [address], [place], [place] [address]    Special Requests   Final    NONE Performed at [facility], [place] [address] [place], [place] [address]    Culture MULTIPLE SPECIES PRESENT, SUGGEST RECOLLECTION (A)  Final   Report Status 01/25/2021 FINAL  Final  Resp Panel by RT-PCR (Flu A&B, Covid) Nasopharyngeal Swab     Status: None   Collection Time: 01/28/21 10:54 AM   Specimen: Nasopharyngeal Swab; Nasopharyngeal(NP) swabs in vial transport medium   Result Value Ref Range Status   SARS Coronavirus 2 by RT PCR NEGATIVE NEGATIVE Final    Comment: (NOTE) SARS-CoV-2 target nucleic acids are NOT DETECTED.  The SARS-CoV-2 RNA is generally detectable in upper respiratory specimens during the acute phase of infection. The lowest concentration of SARS-CoV-2 viral copies this assay can detect is 138 copies/mL. A negative result does not preclude SARS-Cov-2 infection and should not be used as the sole basis for treatment or other patient management decisions. A negative result may occur with  improper specimen collection/handling, submission of specimen other than nasopharyngeal swab, presence of viral mutation(s) within the areas targeted by this assay, and inadequate number of viral copies(<138 copies/mL). A negative result must be combined with clinical observations, patient history, and epidemiological information. The expected result is Negative.  Fact Sheet for Patients:  EntrepreneurPulse.com.au  Fact Sheet for Healthcare Providers:  IncredibleEmployment.be  This test is no t yet approved or cleared by the [place] FDA and  has been authorized for detection and/or diagnosis of SARS-CoV-2 by FDA under an Emergency Use Authorization (EUA). This EUA will remain  in effect (meaning this test can be used) for the duration of the COVID-19 declaration under Section 564(b)(1) of the Act, 21 U.S.C.section 360bbb-3(b)(1), unless the authorization is terminated  or revoked sooner.       Influenza A by PCR NEGATIVE NEGATIVE Final   Influenza B by PCR NEGATIVE NEGATIVE Final    Comment: (NOTE) The Xpert Xpress SARS-CoV-2/FLU/RSV plus assay  is intended as an aid in the diagnosis of influenza from Nasopharyngeal swab specimens and should not be used as a sole basis for treatment. Nasal washings and aspirates are unacceptable for Xpert Xpress SARS-CoV-2/FLU/RSV testing.  Fact Sheet for  Patients: EntrepreneurPulse.com.au  Fact Sheet for Healthcare Providers: IncredibleEmployment.be  This test is not yet approved or cleared by the [place] FDA and has been authorized for detection and/or diagnosis of SARS-CoV-2 by FDA under an Emergency Use Authorization (EUA). This EUA will remain in effect (meaning this test can be used) for the duration of the COVID-19 declaration under Section 564(b)(1) of the Act, 21 U.S.C. section 360bbb-3(b)(1), unless the authorization is terminated or revoked.  Performed at [facility], [place] [address], [place], [place] [address]     Please note: You were cared for by a hospitalist during your hospital stay. Once you are discharged, your primary care physician will handle any further medical issues. Please note that NO REFILLS for any discharge medications will be authorized once you are discharged, as it is imperative that you return to your primary care physician (or establish a relationship with a primary care physician if you do not have one) for your post hospital discharge needs so that they can reassess your need for medications and monitor your lab values.    Time coordinating discharge: 40 minutes  SIGNED:   Shelly Coss, MD  [facility] 01/29/2021, 12:31 PM Pager [phone]  If 7PM-7AM, please contact night-coverage [email or web address] Password [email or web address]

## 2021-01-29 NOTE — TOC Transition Note (Addendum)
Transition of Care [facility]) - CM/SW Discharge Note   Patient Details  Name: Lori [place] MRN: [id number] Date of Birth: 1934-11-23  Transition of [facility] CM/SW Contact:  Ross Ludwig, LCSW Phone Number: 01/29/2021, 1:14 PM   Clinical Narrative:     CSW received phone call from [place] at [facility] they do not have any beds available.  CSW updated patient's daughter, and she asked to check on [facility], and [facility] for SNF placement.  Neither facility had beds available.  Patient's daughter agreed to [facility].  They have a bed available for today.  CSW updated patient's daughter, and physician.  Patient will be discharging today.  Patient to be d/c'ed today to [facility] room 131B.  Patient and family agreeable to plans will transport via ems RN to call report to [phone].  CSW was informed that patient's daughter would like to be called once EMS arrives.      Final next level of care: Skilled Nursing Facility Barriers to Discharge: Barriers Resolved   Patient Goals and CMS Choice Patient states their goals for this hospitalization and ongoing recovery are:: To go to SNF for short term rehab, then return back home. CMS Medicare.gov Compare Post Acute Care list provided to:: Patient Represenative (must comment) Choice offered to / list presented to : Adult Children  Discharge Placement   Existing PASRR number confirmed : 01/25/21          Patient chooses bed at: [facility] Patient to be transferred to facility by: [facility] Name of family member notified: Daughter Wells Guiles Patient and family notified of of transfer: 01/29/21  Discharge Plan and Services   Discharge Planning Services: CM Consult Post Acute Care Choice: [place]                               Social Determinants of Health (SDOH) Interventions     Readmission Risk Interventions No flowsheet data found.

## 2021-01-29 NOTE — Plan of Care (Signed)
  Problem: Clinical Measurements: Goal: Cardiovascular complication will be avoided Outcome: Adequate for Discharge   

## 2021-02-04 DIAGNOSIS — M9701XA Periprosthetic fracture around internal prosthetic right hip joint, initial encounter: Secondary | ICD-10-CM | POA: Diagnosis not present

## 2021-02-04 DIAGNOSIS — R5381 Other malaise: Secondary | ICD-10-CM | POA: Diagnosis not present

## 2021-02-06 LAB — GLUCOSE, CAPILLARY: Glucose-Capillary: 92 mg/dL (ref 70–99)

## 2021-02-07 DIAGNOSIS — R110 Nausea: Secondary | ICD-10-CM | POA: Diagnosis not present

## 2021-02-07 DIAGNOSIS — R42 Dizziness and giddiness: Secondary | ICD-10-CM | POA: Diagnosis not present

## 2021-02-07 DIAGNOSIS — M109 Gout, unspecified: Secondary | ICD-10-CM | POA: Diagnosis not present

## 2021-02-07 DIAGNOSIS — R82998 Other abnormal findings in urine: Secondary | ICD-10-CM | POA: Diagnosis not present

## 2021-02-08 DIAGNOSIS — Z471 Aftercare following joint replacement surgery: Secondary | ICD-10-CM | POA: Diagnosis not present

## 2021-02-08 DIAGNOSIS — Z96641 Presence of right artificial hip joint: Secondary | ICD-10-CM | POA: Diagnosis not present

## 2021-02-12 DIAGNOSIS — N390 Urinary tract infection, site not specified: Secondary | ICD-10-CM | POA: Diagnosis not present

## 2021-02-12 DIAGNOSIS — R141 Gas pain: Secondary | ICD-10-CM | POA: Diagnosis not present

## 2021-02-12 DIAGNOSIS — R42 Dizziness and giddiness: Secondary | ICD-10-CM | POA: Diagnosis not present

## 2021-02-20 DIAGNOSIS — G629 Polyneuropathy, unspecified: Secondary | ICD-10-CM | POA: Diagnosis not present

## 2021-02-20 DIAGNOSIS — R42 Dizziness and giddiness: Secondary | ICD-10-CM | POA: Diagnosis not present

## 2021-02-20 DIAGNOSIS — R0602 Shortness of breath: Secondary | ICD-10-CM | POA: Diagnosis not present

## 2021-02-21 DIAGNOSIS — R141 Gas pain: Secondary | ICD-10-CM | POA: Diagnosis not present

## 2021-02-27 DIAGNOSIS — R21 Rash and other nonspecific skin eruption: Secondary | ICD-10-CM | POA: Diagnosis not present

## 2021-03-07 DIAGNOSIS — G629 Polyneuropathy, unspecified: Secondary | ICD-10-CM | POA: Diagnosis not present

## 2021-03-07 DIAGNOSIS — I4891 Unspecified atrial fibrillation: Secondary | ICD-10-CM | POA: Diagnosis not present

## 2021-03-07 DIAGNOSIS — M9701XA Periprosthetic fracture around internal prosthetic right hip joint, initial encounter: Secondary | ICD-10-CM | POA: Diagnosis not present

## 2021-03-07 DIAGNOSIS — G4700 Insomnia, unspecified: Secondary | ICD-10-CM | POA: Diagnosis not present

## 2021-03-07 DIAGNOSIS — I10 Essential (primary) hypertension: Secondary | ICD-10-CM | POA: Diagnosis not present

## 2021-03-12 DIAGNOSIS — Z471 Aftercare following joint replacement surgery: Secondary | ICD-10-CM | POA: Diagnosis not present

## 2021-03-12 DIAGNOSIS — Z96641 Presence of right artificial hip joint: Secondary | ICD-10-CM | POA: Diagnosis not present

## 2021-03-13 DIAGNOSIS — M9701XA Periprosthetic fracture around internal prosthetic right hip joint, initial encounter: Secondary | ICD-10-CM | POA: Diagnosis not present

## 2021-03-13 DIAGNOSIS — R21 Rash and other nonspecific skin eruption: Secondary | ICD-10-CM | POA: Diagnosis not present

## 2021-03-13 DIAGNOSIS — R5381 Other malaise: Secondary | ICD-10-CM | POA: Diagnosis not present

## 2021-03-19 DIAGNOSIS — F4323 Adjustment disorder with mixed anxiety and depressed mood: Secondary | ICD-10-CM | POA: Diagnosis not present

## 2021-03-19 DIAGNOSIS — F5102 Adjustment insomnia: Secondary | ICD-10-CM | POA: Diagnosis not present

## 2021-03-20 DIAGNOSIS — G629 Polyneuropathy, unspecified: Secondary | ICD-10-CM | POA: Diagnosis not present

## 2021-03-20 DIAGNOSIS — I10 Essential (primary) hypertension: Secondary | ICD-10-CM | POA: Diagnosis not present

## 2021-03-20 DIAGNOSIS — R110 Nausea: Secondary | ICD-10-CM | POA: Diagnosis not present

## 2021-03-20 DIAGNOSIS — I4891 Unspecified atrial fibrillation: Secondary | ICD-10-CM | POA: Diagnosis not present

## 2021-03-20 DIAGNOSIS — E785 Hyperlipidemia, unspecified: Secondary | ICD-10-CM | POA: Diagnosis not present

## 2021-03-20 DIAGNOSIS — R42 Dizziness and giddiness: Secondary | ICD-10-CM | POA: Diagnosis not present

## 2021-03-20 DIAGNOSIS — E119 Type 2 diabetes mellitus without complications: Secondary | ICD-10-CM | POA: Diagnosis not present

## 2021-03-20 DIAGNOSIS — F32A Depression, unspecified: Secondary | ICD-10-CM | POA: Diagnosis not present

## 2021-03-21 DIAGNOSIS — R8290 Unspecified abnormal findings in urine: Secondary | ICD-10-CM | POA: Diagnosis not present

## 2021-03-21 DIAGNOSIS — R32 Unspecified urinary incontinence: Secondary | ICD-10-CM | POA: Diagnosis not present

## 2021-03-21 DIAGNOSIS — Z6828 Body mass index (BMI) 28.0-28.9, adult: Secondary | ICD-10-CM | POA: Diagnosis not present

## 2021-03-21 DIAGNOSIS — I110 Hypertensive heart disease with heart failure: Secondary | ICD-10-CM | POA: Diagnosis not present

## 2021-03-21 DIAGNOSIS — J449 Chronic obstructive pulmonary disease, unspecified: Secondary | ICD-10-CM | POA: Diagnosis not present

## 2021-03-21 DIAGNOSIS — R110 Nausea: Secondary | ICD-10-CM | POA: Diagnosis not present

## 2021-03-21 DIAGNOSIS — M79672 Pain in left foot: Secondary | ICD-10-CM | POA: Diagnosis not present

## 2021-03-21 DIAGNOSIS — M1990 Unspecified osteoarthritis, unspecified site: Secondary | ICD-10-CM | POA: Diagnosis not present

## 2021-03-21 DIAGNOSIS — E785 Hyperlipidemia, unspecified: Secondary | ICD-10-CM | POA: Diagnosis not present

## 2021-03-21 DIAGNOSIS — Z7409 Other reduced mobility: Secondary | ICD-10-CM | POA: Diagnosis not present

## 2021-03-21 DIAGNOSIS — E118 Type 2 diabetes mellitus with unspecified complications: Secondary | ICD-10-CM | POA: Diagnosis not present

## 2021-03-21 DIAGNOSIS — R351 Nocturia: Secondary | ICD-10-CM | POA: Diagnosis not present

## 2021-03-21 DIAGNOSIS — E559 Vitamin D deficiency, unspecified: Secondary | ICD-10-CM | POA: Diagnosis not present

## 2021-03-21 DIAGNOSIS — Z87440 Personal history of urinary (tract) infections: Secondary | ICD-10-CM | POA: Diagnosis not present

## 2021-03-21 DIAGNOSIS — D6869 Other thrombophilia: Secondary | ICD-10-CM | POA: Diagnosis not present

## 2021-03-21 DIAGNOSIS — M79671 Pain in right foot: Secondary | ICD-10-CM | POA: Diagnosis not present

## 2021-03-21 DIAGNOSIS — N390 Urinary tract infection, site not specified: Secondary | ICD-10-CM | POA: Diagnosis not present

## 2021-03-21 DIAGNOSIS — I509 Heart failure, unspecified: Secondary | ICD-10-CM | POA: Diagnosis not present

## 2021-03-21 DIAGNOSIS — I10 Essential (primary) hypertension: Secondary | ICD-10-CM | POA: Diagnosis not present

## 2021-03-21 DIAGNOSIS — Z79899 Other long term (current) drug therapy: Secondary | ICD-10-CM | POA: Diagnosis not present

## 2021-03-21 DIAGNOSIS — D649 Anemia, unspecified: Secondary | ICD-10-CM | POA: Diagnosis not present

## 2021-03-21 DIAGNOSIS — E1142 Type 2 diabetes mellitus with diabetic polyneuropathy: Secondary | ICD-10-CM | POA: Diagnosis not present

## 2021-03-21 DIAGNOSIS — N3000 Acute cystitis without hematuria: Secondary | ICD-10-CM | POA: Diagnosis not present

## 2021-03-21 DIAGNOSIS — R269 Unspecified abnormalities of gait and mobility: Secondary | ICD-10-CM | POA: Diagnosis not present

## 2021-03-21 DIAGNOSIS — E538 Deficiency of other specified B group vitamins: Secondary | ICD-10-CM | POA: Diagnosis not present

## 2021-03-21 DIAGNOSIS — F324 Major depressive disorder, single episode, in partial remission: Secondary | ICD-10-CM | POA: Diagnosis not present

## 2021-03-21 DIAGNOSIS — N3281 Overactive bladder: Secondary | ICD-10-CM | POA: Diagnosis not present

## 2021-03-21 DIAGNOSIS — R8271 Bacteriuria: Secondary | ICD-10-CM | POA: Diagnosis not present

## 2021-03-21 DIAGNOSIS — G8929 Other chronic pain: Secondary | ICD-10-CM | POA: Diagnosis not present

## 2021-03-21 DIAGNOSIS — N3020 Other chronic cystitis without hematuria: Secondary | ICD-10-CM | POA: Diagnosis not present

## 2021-03-21 DIAGNOSIS — I4891 Unspecified atrial fibrillation: Secondary | ICD-10-CM | POA: Diagnosis not present

## 2021-03-21 DIAGNOSIS — E1165 Type 2 diabetes mellitus with hyperglycemia: Secondary | ICD-10-CM | POA: Diagnosis not present

## 2021-03-21 DIAGNOSIS — K5900 Constipation, unspecified: Secondary | ICD-10-CM | POA: Diagnosis not present

## 2021-03-24 DIAGNOSIS — R600 Localized edema: Secondary | ICD-10-CM | POA: Diagnosis not present

## 2021-03-24 DIAGNOSIS — R110 Nausea: Secondary | ICD-10-CM | POA: Diagnosis not present

## 2021-03-24 DIAGNOSIS — E041 Nontoxic single thyroid nodule: Secondary | ICD-10-CM | POA: Diagnosis not present

## 2021-03-24 DIAGNOSIS — R42 Dizziness and giddiness: Secondary | ICD-10-CM | POA: Diagnosis not present

## 2021-03-24 DIAGNOSIS — E042 Nontoxic multinodular goiter: Secondary | ICD-10-CM | POA: Diagnosis not present

## 2021-03-24 DIAGNOSIS — J9811 Atelectasis: Secondary | ICD-10-CM | POA: Diagnosis not present

## 2021-03-24 DIAGNOSIS — R0600 Dyspnea, unspecified: Secondary | ICD-10-CM | POA: Diagnosis not present

## 2021-03-24 DIAGNOSIS — M7989 Other specified soft tissue disorders: Secondary | ICD-10-CM | POA: Diagnosis not present

## 2021-03-24 DIAGNOSIS — I517 Cardiomegaly: Secondary | ICD-10-CM | POA: Diagnosis not present

## 2021-03-24 DIAGNOSIS — R0609 Other forms of dyspnea: Secondary | ICD-10-CM | POA: Diagnosis not present

## 2021-03-24 DIAGNOSIS — R0602 Shortness of breath: Secondary | ICD-10-CM | POA: Diagnosis not present

## 2021-03-25 DIAGNOSIS — Z87828 Personal history of other (healed) physical injury and trauma: Secondary | ICD-10-CM | POA: Diagnosis not present

## 2021-03-25 DIAGNOSIS — E669 Obesity, unspecified: Secondary | ICD-10-CM | POA: Diagnosis not present

## 2021-03-25 DIAGNOSIS — R42 Dizziness and giddiness: Secondary | ICD-10-CM | POA: Diagnosis not present

## 2021-03-25 DIAGNOSIS — R918 Other nonspecific abnormal finding of lung field: Secondary | ICD-10-CM | POA: Diagnosis not present

## 2021-03-25 DIAGNOSIS — E1169 Type 2 diabetes mellitus with other specified complication: Secondary | ICD-10-CM | POA: Diagnosis not present

## 2021-03-25 DIAGNOSIS — I10 Essential (primary) hypertension: Secondary | ICD-10-CM | POA: Diagnosis not present

## 2021-03-25 DIAGNOSIS — Z7901 Long term (current) use of anticoagulants: Secondary | ICD-10-CM | POA: Diagnosis not present

## 2021-03-25 DIAGNOSIS — S72144A Nondisplaced intertrochanteric fracture of right femur, initial encounter for closed fracture: Secondary | ICD-10-CM | POA: Diagnosis not present

## 2021-03-25 DIAGNOSIS — S73004A Unspecified dislocation of right hip, initial encounter: Secondary | ICD-10-CM | POA: Diagnosis not present

## 2021-03-25 DIAGNOSIS — T84020A Dislocation of internal right hip prosthesis, initial encounter: Secondary | ICD-10-CM | POA: Diagnosis not present

## 2021-03-25 DIAGNOSIS — M25559 Pain in unspecified hip: Secondary | ICD-10-CM | POA: Diagnosis not present

## 2021-03-25 DIAGNOSIS — Z96641 Presence of right artificial hip joint: Secondary | ICD-10-CM | POA: Diagnosis not present

## 2021-03-25 DIAGNOSIS — I110 Hypertensive heart disease with heart failure: Secondary | ICD-10-CM | POA: Diagnosis not present

## 2021-03-25 DIAGNOSIS — I5033 Acute on chronic diastolic (congestive) heart failure: Secondary | ICD-10-CM | POA: Diagnosis not present

## 2021-03-25 DIAGNOSIS — Z853 Personal history of malignant neoplasm of breast: Secondary | ICD-10-CM | POA: Diagnosis not present

## 2021-03-25 DIAGNOSIS — I482 Chronic atrial fibrillation, unspecified: Secondary | ICD-10-CM | POA: Diagnosis not present

## 2021-03-25 DIAGNOSIS — M1711 Unilateral primary osteoarthritis, right knee: Secondary | ICD-10-CM | POA: Diagnosis not present

## 2021-03-25 DIAGNOSIS — N390 Urinary tract infection, site not specified: Secondary | ICD-10-CM | POA: Diagnosis not present

## 2021-03-25 DIAGNOSIS — T84020D Dislocation of internal right hip prosthesis, subsequent encounter: Secondary | ICD-10-CM | POA: Diagnosis not present

## 2021-03-25 DIAGNOSIS — Z9889 Other specified postprocedural states: Secondary | ICD-10-CM | POA: Diagnosis not present

## 2021-03-25 DIAGNOSIS — Z96642 Presence of left artificial hip joint: Secondary | ICD-10-CM | POA: Diagnosis not present

## 2021-03-26 DIAGNOSIS — N390 Urinary tract infection, site not specified: Secondary | ICD-10-CM | POA: Diagnosis not present

## 2021-03-26 DIAGNOSIS — Z853 Personal history of malignant neoplasm of breast: Secondary | ICD-10-CM | POA: Diagnosis not present

## 2021-03-26 DIAGNOSIS — T84020A Dislocation of internal right hip prosthesis, initial encounter: Secondary | ICD-10-CM | POA: Diagnosis not present

## 2021-03-27 DIAGNOSIS — T84020D Dislocation of internal right hip prosthesis, subsequent encounter: Secondary | ICD-10-CM | POA: Diagnosis not present

## 2021-03-27 DIAGNOSIS — F32A Depression, unspecified: Secondary | ICD-10-CM | POA: Diagnosis not present

## 2021-03-27 DIAGNOSIS — I110 Hypertensive heart disease with heart failure: Secondary | ICD-10-CM | POA: Diagnosis not present

## 2021-03-27 DIAGNOSIS — I5033 Acute on chronic diastolic (congestive) heart failure: Secondary | ICD-10-CM | POA: Diagnosis not present

## 2021-03-27 DIAGNOSIS — N390 Urinary tract infection, site not specified: Secondary | ICD-10-CM | POA: Diagnosis not present

## 2021-03-27 DIAGNOSIS — D649 Anemia, unspecified: Secondary | ICD-10-CM | POA: Diagnosis not present

## 2021-03-27 DIAGNOSIS — F419 Anxiety disorder, unspecified: Secondary | ICD-10-CM | POA: Diagnosis not present

## 2021-03-27 DIAGNOSIS — E119 Type 2 diabetes mellitus without complications: Secondary | ICD-10-CM | POA: Diagnosis not present

## 2021-03-27 DIAGNOSIS — I482 Chronic atrial fibrillation, unspecified: Secondary | ICD-10-CM | POA: Diagnosis not present

## 2021-03-31 DIAGNOSIS — D649 Anemia, unspecified: Secondary | ICD-10-CM | POA: Diagnosis not present

## 2021-03-31 DIAGNOSIS — I110 Hypertensive heart disease with heart failure: Secondary | ICD-10-CM | POA: Diagnosis not present

## 2021-03-31 DIAGNOSIS — F419 Anxiety disorder, unspecified: Secondary | ICD-10-CM | POA: Diagnosis not present

## 2021-03-31 DIAGNOSIS — I5033 Acute on chronic diastolic (congestive) heart failure: Secondary | ICD-10-CM | POA: Diagnosis not present

## 2021-03-31 DIAGNOSIS — I482 Chronic atrial fibrillation, unspecified: Secondary | ICD-10-CM | POA: Diagnosis not present

## 2021-03-31 DIAGNOSIS — F32A Depression, unspecified: Secondary | ICD-10-CM | POA: Diagnosis not present

## 2021-03-31 DIAGNOSIS — T84020D Dislocation of internal right hip prosthesis, subsequent encounter: Secondary | ICD-10-CM | POA: Diagnosis not present

## 2021-03-31 DIAGNOSIS — E119 Type 2 diabetes mellitus without complications: Secondary | ICD-10-CM | POA: Diagnosis not present

## 2021-03-31 DIAGNOSIS — N390 Urinary tract infection, site not specified: Secondary | ICD-10-CM | POA: Diagnosis not present

## 2021-04-01 DIAGNOSIS — Z96641 Presence of right artificial hip joint: Secondary | ICD-10-CM | POA: Diagnosis not present

## 2021-04-01 DIAGNOSIS — Z471 Aftercare following joint replacement surgery: Secondary | ICD-10-CM | POA: Diagnosis not present

## 2021-04-03 DIAGNOSIS — T84020D Dislocation of internal right hip prosthesis, subsequent encounter: Secondary | ICD-10-CM | POA: Diagnosis not present

## 2021-04-03 DIAGNOSIS — E119 Type 2 diabetes mellitus without complications: Secondary | ICD-10-CM | POA: Diagnosis not present

## 2021-04-03 DIAGNOSIS — F419 Anxiety disorder, unspecified: Secondary | ICD-10-CM | POA: Diagnosis not present

## 2021-04-03 DIAGNOSIS — I110 Hypertensive heart disease with heart failure: Secondary | ICD-10-CM | POA: Diagnosis not present

## 2021-04-03 DIAGNOSIS — F32A Depression, unspecified: Secondary | ICD-10-CM | POA: Diagnosis not present

## 2021-04-03 DIAGNOSIS — N390 Urinary tract infection, site not specified: Secondary | ICD-10-CM | POA: Diagnosis not present

## 2021-04-03 DIAGNOSIS — I482 Chronic atrial fibrillation, unspecified: Secondary | ICD-10-CM | POA: Diagnosis not present

## 2021-04-03 DIAGNOSIS — D649 Anemia, unspecified: Secondary | ICD-10-CM | POA: Diagnosis not present

## 2021-04-03 DIAGNOSIS — I5033 Acute on chronic diastolic (congestive) heart failure: Secondary | ICD-10-CM | POA: Diagnosis not present

## 2021-04-04 DIAGNOSIS — F419 Anxiety disorder, unspecified: Secondary | ICD-10-CM | POA: Diagnosis not present

## 2021-04-04 DIAGNOSIS — F32A Depression, unspecified: Secondary | ICD-10-CM | POA: Diagnosis not present

## 2021-04-04 DIAGNOSIS — I5033 Acute on chronic diastolic (congestive) heart failure: Secondary | ICD-10-CM | POA: Diagnosis not present

## 2021-04-04 DIAGNOSIS — N390 Urinary tract infection, site not specified: Secondary | ICD-10-CM | POA: Diagnosis not present

## 2021-04-04 DIAGNOSIS — D649 Anemia, unspecified: Secondary | ICD-10-CM | POA: Diagnosis not present

## 2021-04-04 DIAGNOSIS — I110 Hypertensive heart disease with heart failure: Secondary | ICD-10-CM | POA: Diagnosis not present

## 2021-04-04 DIAGNOSIS — I482 Chronic atrial fibrillation, unspecified: Secondary | ICD-10-CM | POA: Diagnosis not present

## 2021-04-04 DIAGNOSIS — E119 Type 2 diabetes mellitus without complications: Secondary | ICD-10-CM | POA: Diagnosis not present

## 2021-04-04 DIAGNOSIS — T84020D Dislocation of internal right hip prosthesis, subsequent encounter: Secondary | ICD-10-CM | POA: Diagnosis not present

## 2021-04-08 DIAGNOSIS — J449 Chronic obstructive pulmonary disease, unspecified: Secondary | ICD-10-CM | POA: Diagnosis not present

## 2021-04-08 DIAGNOSIS — Z87440 Personal history of urinary (tract) infections: Secondary | ICD-10-CM | POA: Diagnosis not present

## 2021-04-08 DIAGNOSIS — I50810 Right heart failure, unspecified: Secondary | ICD-10-CM | POA: Diagnosis not present

## 2021-04-08 DIAGNOSIS — M1990 Unspecified osteoarthritis, unspecified site: Secondary | ICD-10-CM | POA: Diagnosis not present

## 2021-04-08 DIAGNOSIS — I471 Supraventricular tachycardia: Secondary | ICD-10-CM | POA: Diagnosis not present

## 2021-04-08 DIAGNOSIS — Z79899 Other long term (current) drug therapy: Secondary | ICD-10-CM | POA: Diagnosis not present

## 2021-04-08 DIAGNOSIS — E538 Deficiency of other specified B group vitamins: Secondary | ICD-10-CM | POA: Diagnosis not present

## 2021-04-08 DIAGNOSIS — D649 Anemia, unspecified: Secondary | ICD-10-CM | POA: Diagnosis not present

## 2021-04-08 DIAGNOSIS — N390 Urinary tract infection, site not specified: Secondary | ICD-10-CM | POA: Diagnosis not present

## 2021-04-08 DIAGNOSIS — E559 Vitamin D deficiency, unspecified: Secondary | ICD-10-CM | POA: Diagnosis not present

## 2021-04-09 DIAGNOSIS — D649 Anemia, unspecified: Secondary | ICD-10-CM | POA: Diagnosis not present

## 2021-04-09 DIAGNOSIS — I5033 Acute on chronic diastolic (congestive) heart failure: Secondary | ICD-10-CM | POA: Diagnosis not present

## 2021-04-09 DIAGNOSIS — I482 Chronic atrial fibrillation, unspecified: Secondary | ICD-10-CM | POA: Diagnosis not present

## 2021-04-09 DIAGNOSIS — T84020D Dislocation of internal right hip prosthesis, subsequent encounter: Secondary | ICD-10-CM | POA: Diagnosis not present

## 2021-04-09 DIAGNOSIS — I110 Hypertensive heart disease with heart failure: Secondary | ICD-10-CM | POA: Diagnosis not present

## 2021-04-09 DIAGNOSIS — F32A Depression, unspecified: Secondary | ICD-10-CM | POA: Diagnosis not present

## 2021-04-09 DIAGNOSIS — F419 Anxiety disorder, unspecified: Secondary | ICD-10-CM | POA: Diagnosis not present

## 2021-04-09 DIAGNOSIS — E119 Type 2 diabetes mellitus without complications: Secondary | ICD-10-CM | POA: Diagnosis not present

## 2021-04-09 DIAGNOSIS — N390 Urinary tract infection, site not specified: Secondary | ICD-10-CM | POA: Diagnosis not present

## 2021-04-11 DIAGNOSIS — I482 Chronic atrial fibrillation, unspecified: Secondary | ICD-10-CM | POA: Diagnosis not present

## 2021-04-11 DIAGNOSIS — D649 Anemia, unspecified: Secondary | ICD-10-CM | POA: Diagnosis not present

## 2021-04-11 DIAGNOSIS — F32A Depression, unspecified: Secondary | ICD-10-CM | POA: Diagnosis not present

## 2021-04-11 DIAGNOSIS — N390 Urinary tract infection, site not specified: Secondary | ICD-10-CM | POA: Diagnosis not present

## 2021-04-11 DIAGNOSIS — I110 Hypertensive heart disease with heart failure: Secondary | ICD-10-CM | POA: Diagnosis not present

## 2021-04-11 DIAGNOSIS — I5033 Acute on chronic diastolic (congestive) heart failure: Secondary | ICD-10-CM | POA: Diagnosis not present

## 2021-04-11 DIAGNOSIS — E119 Type 2 diabetes mellitus without complications: Secondary | ICD-10-CM | POA: Diagnosis not present

## 2021-04-11 DIAGNOSIS — F419 Anxiety disorder, unspecified: Secondary | ICD-10-CM | POA: Diagnosis not present

## 2021-04-11 DIAGNOSIS — T84020D Dislocation of internal right hip prosthesis, subsequent encounter: Secondary | ICD-10-CM | POA: Diagnosis not present

## 2021-04-15 DIAGNOSIS — F419 Anxiety disorder, unspecified: Secondary | ICD-10-CM | POA: Diagnosis not present

## 2021-04-15 DIAGNOSIS — D649 Anemia, unspecified: Secondary | ICD-10-CM | POA: Diagnosis not present

## 2021-04-15 DIAGNOSIS — T84020D Dislocation of internal right hip prosthesis, subsequent encounter: Secondary | ICD-10-CM | POA: Diagnosis not present

## 2021-04-15 DIAGNOSIS — I5033 Acute on chronic diastolic (congestive) heart failure: Secondary | ICD-10-CM | POA: Diagnosis not present

## 2021-04-15 DIAGNOSIS — I482 Chronic atrial fibrillation, unspecified: Secondary | ICD-10-CM | POA: Diagnosis not present

## 2021-04-15 DIAGNOSIS — F32A Depression, unspecified: Secondary | ICD-10-CM | POA: Diagnosis not present

## 2021-04-15 DIAGNOSIS — I110 Hypertensive heart disease with heart failure: Secondary | ICD-10-CM | POA: Diagnosis not present

## 2021-04-15 DIAGNOSIS — E119 Type 2 diabetes mellitus without complications: Secondary | ICD-10-CM | POA: Diagnosis not present

## 2021-04-15 DIAGNOSIS — N390 Urinary tract infection, site not specified: Secondary | ICD-10-CM | POA: Diagnosis not present

## 2021-04-16 DIAGNOSIS — F32A Depression, unspecified: Secondary | ICD-10-CM | POA: Diagnosis not present

## 2021-04-16 DIAGNOSIS — N390 Urinary tract infection, site not specified: Secondary | ICD-10-CM | POA: Diagnosis not present

## 2021-04-16 DIAGNOSIS — I482 Chronic atrial fibrillation, unspecified: Secondary | ICD-10-CM | POA: Diagnosis not present

## 2021-04-16 DIAGNOSIS — I5033 Acute on chronic diastolic (congestive) heart failure: Secondary | ICD-10-CM | POA: Diagnosis not present

## 2021-04-16 DIAGNOSIS — I110 Hypertensive heart disease with heart failure: Secondary | ICD-10-CM | POA: Diagnosis not present

## 2021-04-16 DIAGNOSIS — E119 Type 2 diabetes mellitus without complications: Secondary | ICD-10-CM | POA: Diagnosis not present

## 2021-04-16 DIAGNOSIS — F419 Anxiety disorder, unspecified: Secondary | ICD-10-CM | POA: Diagnosis not present

## 2021-04-16 DIAGNOSIS — D649 Anemia, unspecified: Secondary | ICD-10-CM | POA: Diagnosis not present

## 2021-04-16 DIAGNOSIS — T84020D Dislocation of internal right hip prosthesis, subsequent encounter: Secondary | ICD-10-CM | POA: Diagnosis not present

## 2021-04-17 DIAGNOSIS — D649 Anemia, unspecified: Secondary | ICD-10-CM | POA: Diagnosis not present

## 2021-04-17 DIAGNOSIS — F32A Depression, unspecified: Secondary | ICD-10-CM | POA: Diagnosis not present

## 2021-04-17 DIAGNOSIS — E119 Type 2 diabetes mellitus without complications: Secondary | ICD-10-CM | POA: Diagnosis not present

## 2021-04-17 DIAGNOSIS — I5033 Acute on chronic diastolic (congestive) heart failure: Secondary | ICD-10-CM | POA: Diagnosis not present

## 2021-04-17 DIAGNOSIS — N390 Urinary tract infection, site not specified: Secondary | ICD-10-CM | POA: Diagnosis not present

## 2021-04-17 DIAGNOSIS — T84020D Dislocation of internal right hip prosthesis, subsequent encounter: Secondary | ICD-10-CM | POA: Diagnosis not present

## 2021-04-17 DIAGNOSIS — I110 Hypertensive heart disease with heart failure: Secondary | ICD-10-CM | POA: Diagnosis not present

## 2021-04-17 DIAGNOSIS — F419 Anxiety disorder, unspecified: Secondary | ICD-10-CM | POA: Diagnosis not present

## 2021-04-17 DIAGNOSIS — I482 Chronic atrial fibrillation, unspecified: Secondary | ICD-10-CM | POA: Diagnosis not present

## 2021-04-18 DIAGNOSIS — M9701XD Periprosthetic fracture around internal prosthetic right hip joint, subsequent encounter: Secondary | ICD-10-CM | POA: Diagnosis not present

## 2021-04-22 DIAGNOSIS — T84020D Dislocation of internal right hip prosthesis, subsequent encounter: Secondary | ICD-10-CM | POA: Diagnosis not present

## 2021-04-22 DIAGNOSIS — I5033 Acute on chronic diastolic (congestive) heart failure: Secondary | ICD-10-CM | POA: Diagnosis not present

## 2021-04-22 DIAGNOSIS — F32A Depression, unspecified: Secondary | ICD-10-CM | POA: Diagnosis not present

## 2021-04-22 DIAGNOSIS — I110 Hypertensive heart disease with heart failure: Secondary | ICD-10-CM | POA: Diagnosis not present

## 2021-04-22 DIAGNOSIS — D649 Anemia, unspecified: Secondary | ICD-10-CM | POA: Diagnosis not present

## 2021-04-22 DIAGNOSIS — F419 Anxiety disorder, unspecified: Secondary | ICD-10-CM | POA: Diagnosis not present

## 2021-04-22 DIAGNOSIS — I482 Chronic atrial fibrillation, unspecified: Secondary | ICD-10-CM | POA: Diagnosis not present

## 2021-04-22 DIAGNOSIS — N390 Urinary tract infection, site not specified: Secondary | ICD-10-CM | POA: Diagnosis not present

## 2021-04-22 DIAGNOSIS — E119 Type 2 diabetes mellitus without complications: Secondary | ICD-10-CM | POA: Diagnosis not present

## 2021-04-23 DIAGNOSIS — N390 Urinary tract infection, site not specified: Secondary | ICD-10-CM | POA: Diagnosis not present

## 2021-04-23 DIAGNOSIS — I10 Essential (primary) hypertension: Secondary | ICD-10-CM | POA: Diagnosis not present

## 2021-04-23 DIAGNOSIS — F32A Depression, unspecified: Secondary | ICD-10-CM | POA: Diagnosis not present

## 2021-04-23 DIAGNOSIS — R8290 Unspecified abnormal findings in urine: Secondary | ICD-10-CM | POA: Diagnosis not present

## 2021-04-23 DIAGNOSIS — I4891 Unspecified atrial fibrillation: Secondary | ICD-10-CM | POA: Diagnosis not present

## 2021-04-23 DIAGNOSIS — R252 Cramp and spasm: Secondary | ICD-10-CM | POA: Diagnosis not present

## 2021-04-23 DIAGNOSIS — E119 Type 2 diabetes mellitus without complications: Secondary | ICD-10-CM | POA: Diagnosis not present

## 2021-04-23 DIAGNOSIS — I482 Chronic atrial fibrillation, unspecified: Secondary | ICD-10-CM | POA: Diagnosis not present

## 2021-04-23 DIAGNOSIS — I110 Hypertensive heart disease with heart failure: Secondary | ICD-10-CM | POA: Diagnosis not present

## 2021-04-23 DIAGNOSIS — F419 Anxiety disorder, unspecified: Secondary | ICD-10-CM | POA: Diagnosis not present

## 2021-04-23 DIAGNOSIS — I5033 Acute on chronic diastolic (congestive) heart failure: Secondary | ICD-10-CM | POA: Diagnosis not present

## 2021-04-23 DIAGNOSIS — Z6826 Body mass index (BMI) 26.0-26.9, adult: Secondary | ICD-10-CM | POA: Diagnosis not present

## 2021-04-23 DIAGNOSIS — I50810 Right heart failure, unspecified: Secondary | ICD-10-CM | POA: Diagnosis not present

## 2021-04-23 DIAGNOSIS — T84020D Dislocation of internal right hip prosthesis, subsequent encounter: Secondary | ICD-10-CM | POA: Diagnosis not present

## 2021-04-23 DIAGNOSIS — D649 Anemia, unspecified: Secondary | ICD-10-CM | POA: Diagnosis not present

## 2021-04-24 DIAGNOSIS — I110 Hypertensive heart disease with heart failure: Secondary | ICD-10-CM | POA: Diagnosis not present

## 2021-04-24 DIAGNOSIS — F419 Anxiety disorder, unspecified: Secondary | ICD-10-CM | POA: Diagnosis not present

## 2021-04-24 DIAGNOSIS — F32A Depression, unspecified: Secondary | ICD-10-CM | POA: Diagnosis not present

## 2021-04-24 DIAGNOSIS — D649 Anemia, unspecified: Secondary | ICD-10-CM | POA: Diagnosis not present

## 2021-04-24 DIAGNOSIS — T84020D Dislocation of internal right hip prosthesis, subsequent encounter: Secondary | ICD-10-CM | POA: Diagnosis not present

## 2021-04-24 DIAGNOSIS — E119 Type 2 diabetes mellitus without complications: Secondary | ICD-10-CM | POA: Diagnosis not present

## 2021-04-24 DIAGNOSIS — N390 Urinary tract infection, site not specified: Secondary | ICD-10-CM | POA: Diagnosis not present

## 2021-04-24 DIAGNOSIS — I482 Chronic atrial fibrillation, unspecified: Secondary | ICD-10-CM | POA: Diagnosis not present

## 2021-04-24 DIAGNOSIS — I5033 Acute on chronic diastolic (congestive) heart failure: Secondary | ICD-10-CM | POA: Diagnosis not present

## 2021-04-26 DIAGNOSIS — I482 Chronic atrial fibrillation, unspecified: Secondary | ICD-10-CM | POA: Diagnosis not present

## 2021-04-26 DIAGNOSIS — T84020D Dislocation of internal right hip prosthesis, subsequent encounter: Secondary | ICD-10-CM | POA: Diagnosis not present

## 2021-04-26 DIAGNOSIS — I110 Hypertensive heart disease with heart failure: Secondary | ICD-10-CM | POA: Diagnosis not present

## 2021-04-26 DIAGNOSIS — I5033 Acute on chronic diastolic (congestive) heart failure: Secondary | ICD-10-CM | POA: Diagnosis not present

## 2021-04-26 DIAGNOSIS — E119 Type 2 diabetes mellitus without complications: Secondary | ICD-10-CM | POA: Diagnosis not present

## 2021-04-26 DIAGNOSIS — D649 Anemia, unspecified: Secondary | ICD-10-CM | POA: Diagnosis not present

## 2021-04-26 DIAGNOSIS — N390 Urinary tract infection, site not specified: Secondary | ICD-10-CM | POA: Diagnosis not present

## 2021-04-26 DIAGNOSIS — F419 Anxiety disorder, unspecified: Secondary | ICD-10-CM | POA: Diagnosis not present

## 2021-04-26 DIAGNOSIS — F32A Depression, unspecified: Secondary | ICD-10-CM | POA: Diagnosis not present

## 2021-04-29 DIAGNOSIS — I110 Hypertensive heart disease with heart failure: Secondary | ICD-10-CM | POA: Diagnosis not present

## 2021-04-29 DIAGNOSIS — N390 Urinary tract infection, site not specified: Secondary | ICD-10-CM | POA: Diagnosis not present

## 2021-04-29 DIAGNOSIS — D649 Anemia, unspecified: Secondary | ICD-10-CM | POA: Diagnosis not present

## 2021-04-29 DIAGNOSIS — F419 Anxiety disorder, unspecified: Secondary | ICD-10-CM | POA: Diagnosis not present

## 2021-04-29 DIAGNOSIS — I5033 Acute on chronic diastolic (congestive) heart failure: Secondary | ICD-10-CM | POA: Diagnosis not present

## 2021-04-29 DIAGNOSIS — T84020D Dislocation of internal right hip prosthesis, subsequent encounter: Secondary | ICD-10-CM | POA: Diagnosis not present

## 2021-04-29 DIAGNOSIS — F32A Depression, unspecified: Secondary | ICD-10-CM | POA: Diagnosis not present

## 2021-04-29 DIAGNOSIS — E119 Type 2 diabetes mellitus without complications: Secondary | ICD-10-CM | POA: Diagnosis not present

## 2021-04-29 DIAGNOSIS — I482 Chronic atrial fibrillation, unspecified: Secondary | ICD-10-CM | POA: Diagnosis not present

## 2021-05-01 DIAGNOSIS — I5033 Acute on chronic diastolic (congestive) heart failure: Secondary | ICD-10-CM | POA: Diagnosis not present

## 2021-05-01 DIAGNOSIS — E119 Type 2 diabetes mellitus without complications: Secondary | ICD-10-CM | POA: Diagnosis not present

## 2021-05-01 DIAGNOSIS — N390 Urinary tract infection, site not specified: Secondary | ICD-10-CM | POA: Diagnosis not present

## 2021-05-01 DIAGNOSIS — I482 Chronic atrial fibrillation, unspecified: Secondary | ICD-10-CM | POA: Diagnosis not present

## 2021-05-01 DIAGNOSIS — F32A Depression, unspecified: Secondary | ICD-10-CM | POA: Diagnosis not present

## 2021-05-01 DIAGNOSIS — D649 Anemia, unspecified: Secondary | ICD-10-CM | POA: Diagnosis not present

## 2021-05-01 DIAGNOSIS — T84020D Dislocation of internal right hip prosthesis, subsequent encounter: Secondary | ICD-10-CM | POA: Diagnosis not present

## 2021-05-01 DIAGNOSIS — I110 Hypertensive heart disease with heart failure: Secondary | ICD-10-CM | POA: Diagnosis not present

## 2021-05-01 DIAGNOSIS — F419 Anxiety disorder, unspecified: Secondary | ICD-10-CM | POA: Diagnosis not present

## 2021-05-03 DIAGNOSIS — T84020D Dislocation of internal right hip prosthesis, subsequent encounter: Secondary | ICD-10-CM | POA: Diagnosis not present

## 2021-05-03 DIAGNOSIS — I110 Hypertensive heart disease with heart failure: Secondary | ICD-10-CM | POA: Diagnosis not present

## 2021-05-03 DIAGNOSIS — I5033 Acute on chronic diastolic (congestive) heart failure: Secondary | ICD-10-CM | POA: Diagnosis not present

## 2021-05-03 DIAGNOSIS — F419 Anxiety disorder, unspecified: Secondary | ICD-10-CM | POA: Diagnosis not present

## 2021-05-03 DIAGNOSIS — D649 Anemia, unspecified: Secondary | ICD-10-CM | POA: Diagnosis not present

## 2021-05-03 DIAGNOSIS — I482 Chronic atrial fibrillation, unspecified: Secondary | ICD-10-CM | POA: Diagnosis not present

## 2021-05-03 DIAGNOSIS — N390 Urinary tract infection, site not specified: Secondary | ICD-10-CM | POA: Diagnosis not present

## 2021-05-03 DIAGNOSIS — F32A Depression, unspecified: Secondary | ICD-10-CM | POA: Diagnosis not present

## 2021-05-03 DIAGNOSIS — E119 Type 2 diabetes mellitus without complications: Secondary | ICD-10-CM | POA: Diagnosis not present

## 2021-05-06 DIAGNOSIS — I5033 Acute on chronic diastolic (congestive) heart failure: Secondary | ICD-10-CM | POA: Diagnosis not present

## 2021-05-06 DIAGNOSIS — I110 Hypertensive heart disease with heart failure: Secondary | ICD-10-CM | POA: Diagnosis not present

## 2021-05-06 DIAGNOSIS — E119 Type 2 diabetes mellitus without complications: Secondary | ICD-10-CM | POA: Diagnosis not present

## 2021-05-06 DIAGNOSIS — D649 Anemia, unspecified: Secondary | ICD-10-CM | POA: Diagnosis not present

## 2021-05-06 DIAGNOSIS — F32A Depression, unspecified: Secondary | ICD-10-CM | POA: Diagnosis not present

## 2021-05-06 DIAGNOSIS — T84020D Dislocation of internal right hip prosthesis, subsequent encounter: Secondary | ICD-10-CM | POA: Diagnosis not present

## 2021-05-06 DIAGNOSIS — N390 Urinary tract infection, site not specified: Secondary | ICD-10-CM | POA: Diagnosis not present

## 2021-05-06 DIAGNOSIS — F419 Anxiety disorder, unspecified: Secondary | ICD-10-CM | POA: Diagnosis not present

## 2021-05-06 DIAGNOSIS — I482 Chronic atrial fibrillation, unspecified: Secondary | ICD-10-CM | POA: Diagnosis not present

## 2021-05-08 DIAGNOSIS — I5033 Acute on chronic diastolic (congestive) heart failure: Secondary | ICD-10-CM | POA: Diagnosis not present

## 2021-05-08 DIAGNOSIS — E119 Type 2 diabetes mellitus without complications: Secondary | ICD-10-CM | POA: Diagnosis not present

## 2021-05-08 DIAGNOSIS — F32A Depression, unspecified: Secondary | ICD-10-CM | POA: Diagnosis not present

## 2021-05-08 DIAGNOSIS — T84020D Dislocation of internal right hip prosthesis, subsequent encounter: Secondary | ICD-10-CM | POA: Diagnosis not present

## 2021-05-08 DIAGNOSIS — F419 Anxiety disorder, unspecified: Secondary | ICD-10-CM | POA: Diagnosis not present

## 2021-05-08 DIAGNOSIS — I110 Hypertensive heart disease with heart failure: Secondary | ICD-10-CM | POA: Diagnosis not present

## 2021-05-08 DIAGNOSIS — I482 Chronic atrial fibrillation, unspecified: Secondary | ICD-10-CM | POA: Diagnosis not present

## 2021-05-08 DIAGNOSIS — D649 Anemia, unspecified: Secondary | ICD-10-CM | POA: Diagnosis not present

## 2021-05-08 DIAGNOSIS — N390 Urinary tract infection, site not specified: Secondary | ICD-10-CM | POA: Diagnosis not present

## 2021-05-10 DIAGNOSIS — Z1231 Encounter for screening mammogram for malignant neoplasm of breast: Secondary | ICD-10-CM | POA: Diagnosis not present

## 2021-05-13 DIAGNOSIS — F419 Anxiety disorder, unspecified: Secondary | ICD-10-CM | POA: Diagnosis not present

## 2021-05-13 DIAGNOSIS — D649 Anemia, unspecified: Secondary | ICD-10-CM | POA: Diagnosis not present

## 2021-05-13 DIAGNOSIS — I482 Chronic atrial fibrillation, unspecified: Secondary | ICD-10-CM | POA: Diagnosis not present

## 2021-05-13 DIAGNOSIS — I5033 Acute on chronic diastolic (congestive) heart failure: Secondary | ICD-10-CM | POA: Diagnosis not present

## 2021-05-13 DIAGNOSIS — N390 Urinary tract infection, site not specified: Secondary | ICD-10-CM | POA: Diagnosis not present

## 2021-05-13 DIAGNOSIS — E119 Type 2 diabetes mellitus without complications: Secondary | ICD-10-CM | POA: Diagnosis not present

## 2021-05-13 DIAGNOSIS — T84020D Dislocation of internal right hip prosthesis, subsequent encounter: Secondary | ICD-10-CM | POA: Diagnosis not present

## 2021-05-13 DIAGNOSIS — I110 Hypertensive heart disease with heart failure: Secondary | ICD-10-CM | POA: Diagnosis not present

## 2021-05-13 DIAGNOSIS — F32A Depression, unspecified: Secondary | ICD-10-CM | POA: Diagnosis not present

## 2021-05-19 DIAGNOSIS — M9701XD Periprosthetic fracture around internal prosthetic right hip joint, subsequent encounter: Secondary | ICD-10-CM | POA: Diagnosis not present

## 2021-05-21 DIAGNOSIS — I482 Chronic atrial fibrillation, unspecified: Secondary | ICD-10-CM | POA: Diagnosis not present

## 2021-05-21 DIAGNOSIS — T84020D Dislocation of internal right hip prosthesis, subsequent encounter: Secondary | ICD-10-CM | POA: Diagnosis not present

## 2021-05-21 DIAGNOSIS — F32A Depression, unspecified: Secondary | ICD-10-CM | POA: Diagnosis not present

## 2021-05-21 DIAGNOSIS — I5033 Acute on chronic diastolic (congestive) heart failure: Secondary | ICD-10-CM | POA: Diagnosis not present

## 2021-05-21 DIAGNOSIS — E119 Type 2 diabetes mellitus without complications: Secondary | ICD-10-CM | POA: Diagnosis not present

## 2021-05-21 DIAGNOSIS — I110 Hypertensive heart disease with heart failure: Secondary | ICD-10-CM | POA: Diagnosis not present

## 2021-05-21 DIAGNOSIS — N390 Urinary tract infection, site not specified: Secondary | ICD-10-CM | POA: Diagnosis not present

## 2021-05-21 DIAGNOSIS — D649 Anemia, unspecified: Secondary | ICD-10-CM | POA: Diagnosis not present

## 2021-05-21 DIAGNOSIS — F419 Anxiety disorder, unspecified: Secondary | ICD-10-CM | POA: Diagnosis not present

## 2021-06-05 ENCOUNTER — Ambulatory Visit (INDEPENDENT_AMBULATORY_CARE_PROVIDER_SITE_OTHER): Payer: Medicare PPO | Admitting: Cardiology

## 2021-06-05 ENCOUNTER — Other Ambulatory Visit: Payer: Self-pay

## 2021-06-05 ENCOUNTER — Encounter: Payer: Self-pay | Admitting: Cardiology

## 2021-06-05 VITALS — BP 106/50 | HR 74 | Ht 67.0 in | Wt 172.6 lb

## 2021-06-05 DIAGNOSIS — I110 Hypertensive heart disease with heart failure: Secondary | ICD-10-CM

## 2021-06-05 DIAGNOSIS — I4819 Other persistent atrial fibrillation: Secondary | ICD-10-CM | POA: Diagnosis not present

## 2021-06-05 DIAGNOSIS — Z7901 Long term (current) use of anticoagulants: Secondary | ICD-10-CM

## 2021-06-05 DIAGNOSIS — I5032 Chronic diastolic (congestive) heart failure: Secondary | ICD-10-CM

## 2021-06-05 DIAGNOSIS — I951 Orthostatic hypotension: Secondary | ICD-10-CM

## 2021-06-05 MED ORDER — LABETALOL HCL 100 MG PO TABS: 50.0000 mg | ORAL_TABLET | Freq: Two times a day (BID) | ORAL | 3 refills | Status: DC

## 2021-06-05 NOTE — Patient Instructions (Addendum)
Medication Instructions:  Your physician has recommended you make the following change in your medication:  DECREASE: Labetalol 50 mg per 0.5  tablet by mouth twice daily.  *If you need a refill on your cardiac medications before your next appointment, please call your pharmacy*   Lab Work: None If you have labs (blood work) drawn today and your tests are completely normal, you will receive your results only by: [place] (if you have MyChart) OR A paper copy in the mail If you have any lab test that is abnormal or we need to change your treatment, we will call you to review the results.   Testing/Procedures: None   Follow-Up: At [facility], you and your health needs are our priority.  As part of our continuing mission to provide you with exceptional heart care, we have created designated Provider Care Teams.  These Care Teams include your primary Cardiologist (physician) and Advanced Practice Providers (APPs -  Physician Assistants and Nurse Practitioners) who all work together to provide you with the care you need, when you need it.  We recommend signing up for the patient portal called "MyChart".  Sign up information is provided on this After Visit Summary.  MyChart is used to connect with patients for Virtual Visits (Telemedicine).  Patients are able to view lab/test results, encounter notes, upcoming appointments, etc.  Non-urgent messages can be sent to your provider as well.   To learn more about what you can do with MyChart, go to [email or web address].    Your next appointment:   4 month(s)  The format for your next appointment:   In Person  Provider:   Shirlee More, MD   Other Instructions  Please check your blood pressure in a sitting and standing position daily about an hour after taking your medications. Contact us if your blood pressure is remaining under 110 on the top even after decreasing your labetalol for several weeks.

## 2021-06-05 NOTE — Progress Notes (Signed)
Cardiology Office Note:    Date:  06/05/2021   ID:  Lori Rodriguez, DOB [birth year], MRN [id number]  PCP:  Earlyne Iba, NP  Cardiologist:  Shirlee More, MD    Referring MD: Penelope Coop, FNP    ASSESSMENT:    1. Orthostatic hypotension   2. Persistent atrial fibrillation ([place])   3. Long term (current) use of anticoagulants   4. Hypertensive heart disease with chronic diastolic congestive heart failure ([place])    PLAN:    In order of problems listed above:  She continues to have symptomatic hypotension I brought her nurse in the office stood her up and her blood pressure was 90/50 lightheaded.  I suspect this is what caused the falls and loss of consciousness in her kitchen. Reduce your labetalol again by 50%.  Daughter will check her blood pressures standing at home and contact me if it remains less than 110 and in that case we will stop labetalol and place her just on metoprolol for heart rate control I told her the only blood pressure we can pay attention clinically is standing and unfortunately she will need permissive hypertension sitting and supine Stable rate controlled Continue anticoagulant Heart failure is compensated continue current loop diuretic plan as detailed above and standing blood pressure remains less than 110 stop labetalol and switch to metoprolol   Next appointment: 3 months   Medication Adjustments/Labs and Tests Ordered: Current medicines are reviewed at length with the patient today.  Concerns regarding medicines are outlined above.  No orders of the defined types were placed in this encounter.  Meds ordered this encounter  Medications   labetalol (NORMODYNE) 100 MG tablet    Sig: Take 0.5 tablets (50 mg total) by mouth 2 (two) times daily.    Dispense:  90 tablet    Refill:  3   Follow-up for hypotension she has had several recent falls at home   History of Present Illness:    Lori Rodriguez is a 86 y.o. female with a hx  of permanent atrial fibrillation chronic anticoagulation hypertensive heart disease with heart failure and orthostatic hypotension last seen 12/06/2020.  Medications were adjusted reducing the dose of labetalol and her heart failure was compensated at that time. She had open reduction internal fixation with right femoral peri prosthesis hip fracture 01/24/2021.  There is a notation she had a rapid heart rate perioperatively.  She required parenteral and then oral Cardizem for rate control and resumed her oral anticoagulation EKG was performed as mentation [facility] independently reviewed by me her course was complicated by intermittent confusion throughout hospitalization.  Compliance with diet, lifestyle and medications: Yes  After decrease labetalol it was increased to his previous dose 200 mg/day She is having orthostatic lightheadedness Has had 2 additional falls in the kitchen after going to the bathroom at night she tells me this is if she just falls asleep and finds her self on the ground and has to be assisted standing up. No palpitation shortness of breath edema chest pain. She has had no bleeding from her anticoagulant She had been placed on diltiazem sustained-release in the hospital and appropriately decided to stop taking it on her own  Echocardiogram 01/23/2021 shows normal left ventricular size wall thickness ejection fraction 55 to 60%.  Right ventricle is mildly enlarged with mild hypokinesia and moderate elevated pulmonary artery systolic pressure.  Left atrium moderately dilated.  1. Left ventricular ejection fraction, by estimation, is 55 to  60%. The  left ventricle has normal function. The left ventricle has no regional  wall motion abnormalities. Left ventricular diastolic parameters are  indeterminate.   2. Right ventricular systolic function is mildly reduced. The right  ventricular size is mildly enlarged. There is moderately elevated  pulmonary artery systolic  pressure.   3. Left atrial size was moderately dilated.   4. Right atrial size was moderately dilated.   5. The mitral valve is degenerative. Trivial mitral valve regurgitation.  No evidence of mitral stenosis.   6. The aortic valve was not well visualized. There is mild calcification  of the aortic valve. Aortic valve regurgitation is not visualized. Mild  aortic valve sclerosis is present, with no evidence of aortic valve  stenosis.   7. The inferior vena cava is normal in size with greater than 50%  respiratory variability, suggesting right atrial pressure of 3 mmHg.   At the [facility] health 04/14/2021 with right hip dislocation and was described as acute exacerbation of heart failure.  Hemoglobin was 10.4 creatinine 0.5 sodium 135 potassium 4.0 she did not have a proBNP level performed chest x-ray showed mild bilateral interstitial opacity and EKG showed atrial fibrillation incomplete right bundle branch  Along with correction she also had a CT scan of the chest in the emergency room which showed no findings of pulmonary embolism scarring and atelectasis and a mosaic attenuation pattern in the upper lobes with no specific diagnostic entity Past Medical History:  Diagnosis Date   Anemia    Anginal pain (HCC)    Atrial fibrillation ([place])    Cancer ([place])    Left Breast Cancer   Cancer of central portion of left female breast ([place]) 03/10/2016   CHF (congestive heart failure) (HCC)    Chronic back pain    Chronic kidney disease    Right Renal Mass   Diabetes mellitus without complication (HCC)    DJD (degenerative joint disease)    Dysrhythmia    Atrial fibrillation   Encounter for preprocedural cardiovascular examination 02/28/2016   Formatting of this note might be different from the original. Overview:  Remote normal coronary arteriography and echo 2015 with normal EF% Formatting of this note might be different from the original. Remote normal coronary arteriography and echo 2015 with  normal EF%   Estrogen receptor positive neoplasm 03/10/2016   Hypertension    Hypertensive heart disease 02/28/2016   Long term (current) use of anticoagulants 02/29/2016   Overview:  Overview:  apixaban Overview:  apixaban  Formatting of this note might be different from the original. Overview:  apixaban Formatting of this note might be different from the original. apixaban   Mixed hyperlipidemia 02/29/2016   Orthostatic hypotension 12/01/2018   Osteoporosis    Persistent atrial fibrillation ([place]) 02/28/2016   Overview:  Overview:  CHADS2 vasc score= 4  Overview:  CHADS2 vasc score= 4 Overview:  CHADS2 vasc score= 4  Formatting of this note might be different from the original. Overview:  CHADS2 vasc score= 4  Overview:  CHADS2 vasc score= 4 Formatting of this note might be different from the original. CHADS2 vasc score= 4   Right renal mass    Secondary pulmonary arterial hypertension ([place]) 11/03/2018   Type 2 diabetes mellitus ([place]) 02/29/2016   Vitamin B12 deficiency    Vitamin D deficiency     Past Surgical History:  Procedure Laterality Date   BREAST LUMPECTOMY Left    CARDIAC CATHETERIZATION     CATARACT EXTRACTION, BILATERAL  CHOLECYSTECTOMY     IR RADIOLOGIST EVAL & MGMT  09/29/2018   IR RADIOLOGIST EVAL & MGMT  05/12/2019   IR RADIOLOGIST EVAL & MGMT  05/22/2020   TOTAL HIP ARTHROPLASTY Bilateral    TOTAL HIP REVISION Right 01/24/2021   Procedure: TOTAL HIP REVISION;  Surgeon: Rod Can, MD;  Location: [facility] ORS;  Service: Orthopedics;  Laterality: Right;    Current Medications: Current Meds  Medication Sig   acetaminophen (TYLENOL) 325 MG tablet Take 1-2 tablets (325-650 mg total) by mouth every 6 (six) hours as needed for mild pain (pain score 1-3 or temp > 100.5).   albuterol (PROVENTIL HFA;VENTOLIN HFA) 108 (90 Base) MCG/ACT inhaler Inhale 2 puffs into the lungs 4 (four) times daily as needed for wheezing or shortness of breath.   Calcium Carbonate-Vitamin D (CALCIUM-VITAMIN D3)  600-125 MG-UNIT TABS Take 1 capsule by mouth daily.   Cyanocobalamin (VITAMIN B-12) 5000 MCG TBDP Take 5,000 mcg by mouth daily. Take 2 tablets daily   ferrous sulfate 325 (65 FE) MG tablet Take 325 mg by mouth every other day.    fluticasone furoate-vilanterol (BREO ELLIPTA) 100-25 MCG/INH AEPB Place 1 puff into the nose daily.   furosemide (LASIX) 20 MG tablet Take 20 mg by mouth daily. Can take second tablet in the evening if weight increases more than 3 lbs   glipiZIDE (GLUCOTROL XL) 5 MG 24 hr tablet Take 5 mg by mouth daily.   labetalol (NORMODYNE) 100 MG tablet Take 0.5 tablets (50 mg total) by mouth 2 (two) times daily.   loperamide (IMODIUM A-D) 2 MG tablet Take 2 mg by mouth 4 (four) times daily as needed for diarrhea or loose stools.   metFORMIN (GLUCOPHAGE) 1000 MG tablet Take 1,000 mg by mouth 2 (two) times daily with a meal.   ondansetron (ZOFRAN) 4 MG tablet Take 4 mg by mouth every 8 (eight) hours as needed for nausea or vomiting.   rOPINIRole (REQUIP) 0.25 MG tablet Take 0.25 mg by mouth at bedtime.   rosuvastatin (CRESTOR) 40 MG tablet Take 40 mg by mouth daily.   sertraline (ZOLOFT) 100 MG tablet Take 100 mg by mouth daily.    tolterodine (DETROL LA) 4 MG 24 hr capsule Take 4 mg by mouth every morning.   traMADol (ULTRAM) 50 MG tablet Take by mouth every 6 (six) hours as needed for moderate pain or severe pain.   Vitamin D, Ergocalciferol, (DRISDOL) 1.25 MG (50000 UT) CAPS capsule Take 50,000 Units by mouth once a week.   XARELTO 20 MG TABS tablet TAKE ONE TABLET BY MOUTH AT BEDTIME (Patient taking differently: Take 20 mg by mouth daily with supper.)   [DISCONTINUED] labetalol (NORMODYNE) 200 MG tablet Take 0.5 tablets (100 mg total) by mouth 2 (two) times daily. (Patient taking differently: Take 200 mg by mouth 2 (two) times daily.)     Allergies:   Lisinopril, Percocet [oxycodone-acetaminophen], Valium [diazepam], and Prednisone   Social History   Socioeconomic History    Marital status: Widowed    Spouse name: Not on file   Number of children: Not on file   Years of education: Not on file   Highest education level: Not on file  Occupational History   Not on file  Tobacco Use   Smoking status: Never   Smokeless tobacco: Never  Vaping Use   Vaping Use: Never used  Substance and Sexual Activity   Alcohol use: Never   Drug use: Never   Sexual activity: Not on file  Other Topics Concern   Not on file  Social History Narrative   Not on file   Social Determinants of Health   Financial Resource Strain: Not on file  Food Insecurity: Not on file  Transportation Needs: Not on file  Physical Activity: Not on file  Stress: Not on file  Social Connections: Not on file     Family History: The patient's  family history includes Diabetes in her brother; Heart attack in her brother and father; Myasthenia gravis in her brother. ROS:   Please see the history of present illness.    All other systems reviewed and are negative.  EKGs/Labs/Other Studies Reviewed:    The following studies were reviewed today:   Recent Labs: 01/22/2021: ALT 13 01/26/2021: BUN 36; Creatinine, Ser 1.03; Potassium 4.1; Sodium 133 01/28/2021: Platelets 285 01/29/2021: Hemoglobin 8.3  Recent Lipid Panel No results found for: CHOL, TRIG, HDL, CHOLHDL, VLDL, LDLCALC, LDLDIRECT  Physical Exam:    VS:  BP (!) 106/50 (BP Location: Left Arm, Patient Position: Sitting, Cuff Size: Normal)   Pulse 74   Ht '5\' 7"'$  (1.702 m)   Wt 172 lb 9.6 oz (78.3 kg)   SpO2 97%   BMI 27.03 kg/m     Wt Readings from Last 3 Encounters:  06/05/21 172 lb 9.6 oz (78.3 kg)  01/21/21 181 lb 7 oz (82.3 kg)  12/06/20 179 lb 12.8 oz (81.6 kg)     GEN: Frail elderly woman well nourished, well developed in no acute distress HEENT: Normal NECK: No JVD; No carotid bruits LYMPHATICS: No lymphadenopathy CARDIAC: Irregular rhythm controlled managed  no murmurs, rubs, gallops RESPIRATORY:  Clear to  auscultation without rales, wheezing or rhonchi  ABDOMEN: Soft, non-tender, non-distended MUSCULOSKELETAL:  No edema; No deformity  SKIN: Warm and dry NEUROLOGIC:  Alert and oriented x 3 PSYCHIATRIC:  Normal affect    Signed, Shirlee More, MD  06/05/2021 12:41 PM    [place]

## 2021-06-06 ENCOUNTER — Telehealth: Payer: Self-pay | Admitting: Cardiology

## 2021-06-06 NOTE — Telephone Encounter (Signed)
FYI--Patient wanted to inform Dr. Bettina Gavia that she got news that she tested positive for COVID last night after her appointment.

## 2021-07-01 ENCOUNTER — Ambulatory Visit: Payer: Medicare PPO | Admitting: Emergency Medicine

## 2021-07-01 ENCOUNTER — Other Ambulatory Visit: Payer: Self-pay

## 2021-07-01 ENCOUNTER — Telehealth: Payer: Self-pay | Admitting: Cardiology

## 2021-07-01 VITALS — BP 122/76 | HR 84 | Ht 67.0 in | Wt 175.2 lb

## 2021-07-01 DIAGNOSIS — R0609 Other forms of dyspnea: Secondary | ICD-10-CM

## 2021-07-01 DIAGNOSIS — M7989 Other specified soft tissue disorders: Secondary | ICD-10-CM

## 2021-07-01 DIAGNOSIS — D649 Anemia, unspecified: Secondary | ICD-10-CM

## 2021-07-01 MED ORDER — TORSEMIDE 20 MG PO TABS: 20.0000 mg | ORAL_TABLET | Freq: Two times a day (BID) | ORAL | 1 refills | Status: DC

## 2021-07-01 NOTE — Progress Notes (Signed)
Reason for visit: Leg swelling, SOB  Name of MD requesting visit: Dr. Bettina Gavia  H&P: CHF history, and anemia history   ROS related to problem: Bilateral leg swelling, increased shortness of breath, weight gain, and diuretic  not helping.   Assessment and plan per MD: 2-3+ bilateral leg swelling. Stop lasix start torsemide 20 mg twice daily, labs today, follow up one week.

## 2021-07-01 NOTE — Telephone Encounter (Signed)
Called patient. She reports for the past 4 days she has had increased leg swelling in both legs. She also has worsening shortness of breath. She has gained 4 pounds in 4 days.She has pain in both legs but this is normal for her she said she always has pain in her legs. She did travel for 3 hours in a car last Tuesday. She takes lasix 20 mg daily and has taken 40 mg for the past 4 days but it is not helping. She would like a recommendation on what to do next since things are not improving. Will check with Dr. Bettina Gavia.

## 2021-07-01 NOTE — Telephone Encounter (Signed)
Pt c/o swelling: STAT is pt has developed SOB within 24 hours  If swelling, where is the swelling located? Both legs and both feet  How much weight have you gained and in what time span? 4 pounds in about 3-4 days  Have you gained 3 pounds in a day or 5 pounds in a week? no  Do you have a log of your daily weights (if so, list)? no  Are you currently taking a fluid pill? yes  Are you currently SOB? Not right now. She has been experiencing some SOB but she's been using her breathing treatment and it seems to be helping  Have you traveled recently? Traveled in a car last Monday or Tuesday for about 3 hours. Stopping every now and then.

## 2021-07-01 NOTE — Patient Instructions (Signed)
Medication Instructions:  Your physician has recommended you make the following change in your medication:  STOP: Lasix   START: Torsemide 20 mg twice daily  *If you need a refill on your cardiac medications before your next appointment, please call your pharmacy*   Lab Work: Your physician recommends that you return for lab work today: BMP, PRO BNP, CBC   If you have labs (blood work) drawn today and your tests are completely normal, you will receive your results only by: MyChart Message (if you have MyChart) OR A paper copy in the mail If you have any lab test that is abnormal or we need to change your treatment, we will call you to review the results.   Testing/Procedures: None   Follow-Up: At [facility], you and your health needs are our priority.  As part of our continuing mission to provide you with exceptional heart care, we have created designated Provider Care Teams.  These Care Teams include your primary Cardiologist (physician) and Advanced Practice Providers (APPs -  Physician Assistants and Nurse Practitioners) who all work together to provide you with the care you need, when you need it.  We recommend signing up for the patient portal called "MyChart".  Sign up information is provided on this After Visit Summary.  MyChart is used to connect with patients for Virtual Visits (Telemedicine).  Patients are able to view lab/test results, encounter notes, upcoming appointments, etc.  Non-urgent messages can be sent to your provider as well.   To learn more about what you can do with MyChart, go to [email or web address].    Your next appointment:   1 week(s)  The format for your next appointment:   In Person  Provider:   Shirlee More, MD   Other Instructions  Torsemide Oral Tablets What is this medication? TORSEMIDE (TORE se mide) is a diuretic. It helps you make more urine and lose salt and water from your body. It treats swelling from heart, kidney, or liver  disease. It also treats high blood pressure. This medicine may be used for other purposes; ask your health care provider or pharmacist if you have questions. COMMON BRAND NAME(S): Demadex, SOAANZ What should I tell my care team before I take this medication? They need to know if you have any of these conditions: high or low levels of electrolytes, like magnesium, potassium, and sodium, in your blood diabetes gout kidney disease liver disease an unusual or allergic reaction to torsemide, povidone, other medicines, foods, dyes, or preservatives pregnant or trying to get pregnant breast-feeding How should I use this medication? Take this drug by mouth with water. Take it as directed on the prescription label at the same time every day. Keep taking it unless your health care provider tells you to stop. Talk to your health care provider about the use of this drug in children. Special care may be needed. Overdosage: If you think you have taken too much of this medicine contact a poison control center or emergency room at once. NOTE: This medicine is only for you. Do not share this medicine with others. What if I miss a dose? If you miss a dose, take it as soon as you can. If it is almost time for your next dose, take only that dose. Do not take double or extra doses. What may interact with this medication? alcohol aspirin and aspirin-like medicines celecoxib certain medicines for blood pressure, heart disease, irregular heartbeat certain medicines for cholesterol like cholestyramine certain medicines for diabetes cisplatin  cyclosporine ephedra ginseng lithium medicines for infection like acyclovir, adefovir, amphotericin B, bacitracin, cidofovir, foscarnet, ganciclovir, gentamicin, pentamidine, vancomycin medicines that relax muscles for surgery NSAIDs, medicines for pain and inflammation, like ibuprofen or naproxen other diuretics pamidronate probenecid rifampin steroid medicines  like prednisone or cortisone warfarin zoledronic acid This list may not describe all possible interactions. Give your health care provider a list of all the medicines, herbs, non-prescription drugs, or dietary supplements you use. Also tell them if you smoke, drink alcohol, or use illegal drugs. Some items may interact with your medicine. What should I watch for while using this medication? Visit your health care provider for regular checks on your progress. Tell your health care provider if your symptoms do not start to get better or if they get worse. Check your blood pressure regularly. Ask your health care provider what your blood pressure should be. Also, find out when you should contact him or her. You may need blood work done while you are taking this drug. Do not treat yourself for coughs, colds, or pain while using this drug without asking your health care provider for advice. Some drugs may increase your blood pressure. This drug may increase blood sugar. Ask your health care provider if changes in diet or drugs are needed if you have diabetes. You may need to be on a special diet while you are taking this drug. Ask your health care provider. Also, find out how many glasses of fluids you need to drink each day. Check with your health care provider if you have severe diarrhea, nausea, and vomiting, or if you sweat a lot. The loss of too much body fluid may make it dangerous for you to take this drug. You may get drowsy or dizzy. Do not drive, use machinery, or do anything that needs mental alertness until you know how this drug affects you. Do not stand or sit up quickly, especially if you are an older patient. This reduces the risk of dizzy or fainting spells. Alcohol may interfere with the effects of this drug. Avoid alcoholic drinks. What side effects may I notice from receiving this medication? Side effects that you should report to your doctor or health care professional as soon as  possible: allergic reactions (skin rash, itching or hives; swelling of the face, lips, or tongue) decreased hearing, ringing in the ears electrolyte imbalance (increased thirst; loss of appetite; severe diarrhea; unusual sweating; vomiting) kidney injury (trouble passing urine or change in the amount of urine) low potassium (trouble breathing, chest pain; dizziness; fast, irregular heartbeat; feeling faints or lightheaded, falls; muscle cramps or pain) Side effects that usually do not require medical attention (report to your doctor or health care professional if they continue or are bothersome): passing large amounts of urine stomach pain This list may not describe all possible side effects. Call your doctor for medical advice about side effects. You may report side effects to FDA at 1-800-FDA-1088. Where should I keep my medication? Keep out of the reach of children and pets. Store at room temperature between 15 and 30 degrees C (59 and 86 degrees F). Do not freeze. Throw away any unused drug after the expiration date. NOTE: This sheet is a summary. It may not cover all possible information. If you have questions about this medicine, talk to your doctor, pharmacist, or health care provider.  2022 Elsevier/Gold Standard (2019-05-26 19:52:40)

## 2021-07-01 NOTE — Telephone Encounter (Signed)
Called patient and arrange her to  come in for nurse visit.

## 2021-07-02 ENCOUNTER — Telehealth: Payer: Self-pay

## 2021-07-02 LAB — CBC
Hematocrit: 37.3 % (ref 34.0–46.6)
Hemoglobin: 11.4 g/dL (ref 11.1–15.9)
MCH: 25.4 pg — ABNORMAL LOW (ref 26.6–33.0)
MCHC: 30.6 g/dL — ABNORMAL LOW (ref 31.5–35.7)
MCV: 83 fL (ref 79–97)
Platelets: 255 10*3/uL (ref 150–450)
RBC: 4.48 x10E6/uL (ref 3.77–5.28)
RDW: 15.8 % — ABNORMAL HIGH (ref 11.7–15.4)
WBC: 9.6 10*3/uL (ref 3.4–10.8)

## 2021-07-02 LAB — BASIC METABOLIC PANEL
BUN/Creatinine Ratio: 23 (ref 12–28)
BUN: 15 mg/dL (ref 8–27)
CO2: 25 mmol/L (ref 20–29)
Calcium: 9.7 mg/dL (ref 8.7–10.3)
Chloride: 101 mmol/L (ref 96–106)
Creatinine, Ser: 0.66 mg/dL (ref 0.57–1.00)
Glucose: 58 mg/dL — ABNORMAL LOW (ref 70–99)
Potassium: 4.1 mmol/L (ref 3.5–5.2)
Sodium: 141 mmol/L (ref 134–144)
eGFR: 86 mL/min/{1.73_m2} (ref >59)

## 2021-07-02 LAB — PRO B NATRIURETIC PEPTIDE: NT-Pro BNP: 969 pg/mL — ABNORMAL HIGH (ref 0–738)

## 2021-07-02 NOTE — Telephone Encounter (Signed)
Spoke with patient regarding results and recommendation.  Patient verbalizes understanding and is agreeable to plan of care. Advised patient to call back with any issues or concerns.  

## 2021-07-02 NOTE — Telephone Encounter (Signed)
-----   Message from Richardo Priest, MD sent at 07/02/2021  7:51 AM EDT ----- Normal or stable result  Her hemoglobin is now normal  We increased her diuretics yesterday

## 2021-07-09 NOTE — Progress Notes (Signed)
Cardiology Office Note:    Date:  07/10/2021   ID:  Lori Rodriguez, DOB 1935/02/28, MRN [id number]  PCP:  Earlyne Iba, NP  Cardiologist:  Shirlee More, MD    Referring MD: Earlyne Iba, NP    ASSESSMENT:    1. Hypertensive heart disease with chronic diastolic congestive heart failure (HCC)   2. Persistent atrial fibrillation ([place])   3. Long term (current) use of anticoagulants   4. Orthostatic hypotension    PLAN:    In order of problems listed above:  She is improved I think we avoided a hospitalization by intensifying her diuretics last week.  Today she looks euvolemic we will reduce her torsemide to once daily she will take a second tablet if her weight is up 174 pounds or greater and will continue to trend her blood pressure and recheck before she leaves the office today with her previous orthostatic hypotension.  Recheck renal function proBNP today Stable rate controlled continue beta-blocker anticoagulant Reduce her diuretic continue to follow blood pressure closely   Next appointment: 3 months   Medication Adjustments/Labs and Tests Ordered: Current medicines are reviewed at length with the patient today.  Concerns regarding medicines are outlined above.  No orders of the defined types were placed in this encounter.  No orders of the defined types were placed in this encounter.   Chief Complaint  Patient presents with   Follow-up     History of Present Illness:    Lori Rodriguez is a 86 y.o. female with a hx of permanent atrial fibrillation chronic anticoagulation hypertensive heart disease with heart failure anemia and orthostatic hypotension last seen by me in the office from 06/05/2021.  She was seen on a nurse visit last week with complaints of shortness of breath and edema and was placed on torsemide 20 mg twice daily.  Her proBNP level was significantly elevated from baseline and her hemoglobin that previously was severely reduced was  normal 11.9. Component Ref Range & Units 9 d ago 1 yr ago  NT-Pro BNP 0 - 738 pg/mL 969 High   587 CM    Compliance with diet, lifestyle and medications: Yes  Her weight is down to 67 pounds last week edema is resolved she is not short of breath she had a fall yesterday but she had a misstep.  She has not had orthostatic lightheadedness.  Her blood pressure runs 120 140/70.  Echocardiogram 01/23/2021 shows normal left ventricular size wall thickness ejection fraction 55 to 60%.  Right ventricle is mildly enlarged with mild hypokinesia and moderate elevated pulmonary artery systolic pressure.  Left atrium moderately dilated.   1. Left ventricular ejection fraction, by estimation, is 55 to 60%. The  left ventricle has normal function. The left ventricle has no regional  wall motion abnormalities. Left ventricular diastolic parameters are  indeterminate.   2. Right ventricular systolic function is mildly reduced. The right  ventricular size is mildly enlarged. There is moderately elevated  pulmonary artery systolic pressure.   3. Left atrial size was moderately dilated.   4. Right atrial size was moderately dilated.   5. The mitral valve is degenerative. Trivial mitral valve regurgitation.  No evidence of mitral stenosis.   6. The aortic valve was not well visualized. There is mild calcification  of the aortic valve. Aortic valve regurgitation is not visualized. Mild  aortic valve sclerosis is present, with no evidence of aortic valve  stenosis.   7. The inferior  vena cava is normal in size with greater than 50%  respiratory variability, suggesting right atrial pressure of 3 mmHg.  Past Medical History:  Diagnosis Date   Anemia    Anginal pain (HCC)    Atrial fibrillation ([place])    Cancer (HCC)    Left Breast Cancer   Cancer of central portion of left female breast ([place]) 03/10/2016   CHF (congestive heart failure) (HCC)    Chronic back pain    Chronic kidney disease    Right Renal  Mass   Diabetes mellitus without complication (HCC)    DJD (degenerative joint disease)    Dysrhythmia    Atrial fibrillation   Encounter for preprocedural cardiovascular examination 02/28/2016   Formatting of this note might be different from the original. Overview:  Remote normal coronary arteriography and echo 2015 with normal EF% Formatting of this note might be different from the original. Remote normal coronary arteriography and echo 2015 with normal EF%   Estrogen receptor positive neoplasm 03/10/2016   Hypertension    Hypertensive heart disease 02/28/2016   Long term (current) use of anticoagulants 02/29/2016   Overview:  Overview:  apixaban Overview:  apixaban  Formatting of this note might be different from the original. Overview:  apixaban Formatting of this note might be different from the original. apixaban   Mixed hyperlipidemia 02/29/2016   Orthostatic hypotension 12/01/2018   Osteoporosis    Persistent atrial fibrillation ([place]) 02/28/2016   Overview:  Overview:  CHADS2 vasc score= 4  Overview:  CHADS2 vasc score= 4 Overview:  CHADS2 vasc score= 4  Formatting of this note might be different from the original. Overview:  CHADS2 vasc score= 4  Overview:  CHADS2 vasc score= 4 Formatting of this note might be different from the original. CHADS2 vasc score= 4   Right renal mass    Secondary pulmonary arterial hypertension ([place]) 11/03/2018   Type 2 diabetes mellitus ([place]) 02/29/2016   Vitamin B12 deficiency    Vitamin D deficiency     Past Surgical History:  Procedure Laterality Date   BREAST LUMPECTOMY Left    CARDIAC CATHETERIZATION     CATARACT EXTRACTION, BILATERAL     CHOLECYSTECTOMY     IR RADIOLOGIST EVAL & MGMT  09/29/2018   IR RADIOLOGIST EVAL & MGMT  05/12/2019   IR RADIOLOGIST EVAL & MGMT  05/22/2020   TOTAL HIP ARTHROPLASTY Bilateral    TOTAL HIP REVISION Right 01/24/2021   Procedure: TOTAL HIP REVISION;  Surgeon: Rod Can, MD;  Location: [facility];  Service: Orthopedics;   Laterality: Right;    Current Medications: Current Meds  Medication Sig   acetaminophen (TYLENOL) 325 MG tablet Take 1-2 tablets (325-650 mg total) by mouth every 6 (six) hours as needed for mild pain (pain score 1-3 or temp > 100.5).   albuterol (PROVENTIL HFA;VENTOLIN HFA) 108 (90 Base) MCG/ACT inhaler Inhale 2 puffs into the lungs 4 (four) times daily as needed for wheezing or shortness of breath.   Calcium Carbonate-Vitamin D (CALCIUM-VITAMIN D3) 600-125 MG-UNIT TABS Take 1 capsule by mouth daily.   Cyanocobalamin (VITAMIN B-12) 5000 MCG TBDP Take 5,000 mcg by mouth daily. Take 2 tablets daily   ferrous sulfate 325 (65 FE) MG tablet Take 65 mg by mouth every other day.   fluticasone furoate-vilanterol (BREO ELLIPTA) 100-25 MCG/INH AEPB Inhale 1 puff into the lungs daily.   glipiZIDE (GLUCOTROL XL) 5 MG 24 hr tablet Take 5 mg by mouth daily.   labetalol (NORMODYNE) 100 MG tablet  Take 0.5 tablets (50 mg total) by mouth 2 (two) times daily.   loperamide (IMODIUM A-D) 2 MG tablet Take 2 mg by mouth 4 (four) times daily as needed for diarrhea or loose stools.   metFORMIN (GLUCOPHAGE) 1000 MG tablet Take 1,000 mg by mouth 2 (two) times daily with a meal.   ondansetron (ZOFRAN) 4 MG tablet Take 4 mg by mouth every 8 (eight) hours as needed for nausea or vomiting.   OVER THE COUNTER MEDICATION Take 1 tablet by mouth daily. Pro-15 5 billion cfu 15 strains/   OVER THE COUNTER MEDICATION Take 2 tablets by mouth at bedtime. Stool Softner/ Unknown strength   rOPINIRole (REQUIP) 0.25 MG tablet Take 0.25 mg by mouth at bedtime.   rosuvastatin (CRESTOR) 40 MG tablet Take 40 mg by mouth daily.   sertraline (ZOLOFT) 100 MG tablet Take 100 mg by mouth daily.    tolterodine (DETROL LA) 4 MG 24 hr capsule Take 4 mg by mouth every morning.   torsemide (DEMADEX) 20 MG tablet Take 1 tablet (20 mg total) by mouth 2 (two) times daily.   traMADol (ULTRAM) 50 MG tablet Take 50 mg by mouth 2 (two) times daily.    Vitamin D, Ergocalciferol, (DRISDOL) 1.25 MG (50000 UNIT) CAPS capsule Take 50,000 Units by mouth every 7 (seven) days.   XARELTO 20 MG TABS tablet TAKE ONE TABLET BY MOUTH AT BEDTIME     Allergies:   Lisinopril, Percocet [oxycodone-acetaminophen], Valium [diazepam], and Prednisone   Social History   Socioeconomic History   Marital status: Widowed    Spouse name: Not on file   Number of children: Not on file   Years of education: Not on file   Highest education level: Not on file  Occupational History   Not on file  Tobacco Use   Smoking status: Never   Smokeless tobacco: Never  Vaping Use   Vaping Use: Never used  Substance and Sexual Activity   Alcohol use: Never   Drug use: Never   Sexual activity: Not on file  Other Topics Concern   Not on file  Social History Narrative   Not on file   Social Determinants of Health   Financial Resource Strain: Not on file  Food Insecurity: Not on file  Transportation Needs: Not on file  Physical Activity: Not on file  Stress: Not on file  Social Connections: Not on file     Family History: The patient's family history includes Diabetes in her brother; Heart attack in her brother and father; Myasthenia gravis in her brother. ROS:   Please see the history of present illness.    All other systems reviewed and are negative.  EKGs/Labs/Other Studies Reviewed:    The following studies were reviewed today:    Recent Labs: 01/22/2021: ALT 13 07/01/2021: BUN 15; Creatinine, Ser 0.66; Hemoglobin 11.4; NT-Pro BNP 969; Platelets 255; Potassium 4.1; Sodium 141  Recent Lipid Panel No results found for: CHOL, TRIG, HDL, CHOLHDL, VLDL, LDLCALC, LDLDIRECT  Physical Exam:    VS:  BP (!) 100/50 (BP Location: Right Arm, Patient Position: Sitting, Cuff Size: Normal)   Pulse 84   Ht 5\' 7"  (1.702 m)   Wt 171 lb (77.6 kg)   SpO2 95%   BMI 26.78 kg/m     Wt Readings from Last 3 Encounters:  07/10/21 171 lb (77.6 kg)  07/01/21 175 lb 3.2  oz (79.5 kg)  06/05/21 172 lb 9.6 oz (78.3 kg)     GEN: She  looks frail well nourished, well developed in no acute distress HEENT: Normal NECK: No JVD; No carotid bruits LYMPHATICS: No lymphadenopathy CARDIAC: Irregular rate and rhythm no murmurs, rubs, gallops RESPIRATORY:  Clear to auscultation without rales, wheezing or rhonchi  ABDOMEN: Soft, non-tender, non-distended MUSCULOSKELETAL:  No edema; No deformity  SKIN: Warm and dry NEUROLOGIC:  Alert and oriented x 3 PSYCHIATRIC:  Normal affect    Signed, Shirlee More, MD  07/10/2021 1:22 PM    [facility]

## 2021-07-10 ENCOUNTER — Ambulatory Visit: Payer: Medicare PPO | Admitting: Cardiology

## 2021-07-10 ENCOUNTER — Encounter: Payer: Self-pay | Admitting: Cardiology

## 2021-07-10 ENCOUNTER — Other Ambulatory Visit: Payer: Self-pay

## 2021-07-10 VITALS — BP 100/50 | HR 84 | Ht 67.0 in | Wt 171.0 lb

## 2021-07-10 DIAGNOSIS — Z7901 Long term (current) use of anticoagulants: Secondary | ICD-10-CM

## 2021-07-10 DIAGNOSIS — I951 Orthostatic hypotension: Secondary | ICD-10-CM

## 2021-07-10 DIAGNOSIS — I5032 Chronic diastolic (congestive) heart failure: Secondary | ICD-10-CM

## 2021-07-10 DIAGNOSIS — I110 Hypertensive heart disease with heart failure: Secondary | ICD-10-CM | POA: Diagnosis not present

## 2021-07-10 DIAGNOSIS — I4819 Other persistent atrial fibrillation: Secondary | ICD-10-CM | POA: Diagnosis not present

## 2021-07-10 MED ORDER — TORSEMIDE 20 MG PO TABS: ORAL_TABLET | ORAL | 1 refills | Status: DC

## 2021-07-10 NOTE — Patient Instructions (Signed)
Medication Instructions:  Your physician has recommended you make the following change in your medication:  DECREASE: Torsemide 20 mg take one tablet by mouth daily in the morning. Take an extra tablet in the evening if you weigh 174 or greater.  *If you need a refill on your cardiac medications before your next appointment, please call your pharmacy*   Lab Work: Your physician recommends that you return for lab work in: [place] If you have labs (blood work) drawn today and your tests are completely normal, you will receive your results only by: [place] (if you have [place]) OR A paper copy in the mail If you have any lab test that is abnormal or we need to change your treatment, we will call you to review the results.   Testing/Procedures: None   Follow-Up: At [facility], you and your health needs are our priority.  As part of our continuing mission to provide you with exceptional heart care, we have created designated Provider Care Teams.  These Care Teams include your primary Cardiologist (physician) and Advanced Practice Providers (APPs -  Physician Assistants and Nurse Practitioners) who all work together to provide you with the care you need, when you need it.  We recommend signing up for the patient portal called "MyChart".  Sign up information is provided on this After Visit Summary.  MyChart is used to connect with patients for Virtual Visits (Telemedicine).  Patients are able to view lab/test results, encounter notes, upcoming appointments, etc.  Non-urgent messages can be sent to your provider as well.   To learn more about what you can do with MyChart, go to [email or web address].    Your next appointment:   3 month(s)  The format for your next appointment:   In Person  Provider:   Shirlee More, MD   Other Instructions

## 2021-07-11 ENCOUNTER — Telehealth: Payer: Self-pay

## 2021-07-11 LAB — BASIC METABOLIC PANEL
BUN/Creatinine Ratio: 26 (ref 12–28)
BUN: 20 mg/dL (ref 8–27)
CO2: 22 mmol/L (ref 20–29)
Calcium: 8.8 mg/dL (ref 8.7–10.3)
Chloride: 98 mmol/L (ref 96–106)
Creatinine, Ser: 0.76 mg/dL (ref 0.57–1.00)
Glucose: 270 mg/dL — ABNORMAL HIGH (ref 70–99)
Potassium: 4.3 mmol/L (ref 3.5–5.2)
Sodium: 137 mmol/L (ref 134–144)
eGFR: 77 mL/min/{1.73_m2} (ref >59)

## 2021-07-11 LAB — PRO B NATRIURETIC PEPTIDE: NT-Pro BNP: 1295 pg/mL — ABNORMAL HIGH (ref 0–738)

## 2021-07-11 NOTE — Telephone Encounter (Signed)
-----   Message from Richardo Priest, MD sent at 07/11/2021  7:55 AM EDT ----- Stable kidney function

## 2021-07-11 NOTE — Telephone Encounter (Signed)
Spoke with patient regarding results and recommendation.  Patient verbalizes understanding and is agreeable to plan of care. Advised patient to call back with any issues or concerns.  

## 2021-10-02 DIAGNOSIS — M10072 Idiopathic gout, left ankle and foot: Secondary | ICD-10-CM | POA: Diagnosis not present

## 2021-10-02 DIAGNOSIS — E119 Type 2 diabetes mellitus without complications: Secondary | ICD-10-CM | POA: Diagnosis not present

## 2021-10-02 DIAGNOSIS — S92512K Displaced fracture of proximal phalanx of left lesser toe(s), subsequent encounter for fracture with nonunion: Secondary | ICD-10-CM | POA: Diagnosis not present

## 2021-10-02 DIAGNOSIS — M79672 Pain in left foot: Secondary | ICD-10-CM | POA: Diagnosis not present

## 2021-10-02 DIAGNOSIS — B351 Tinea unguium: Secondary | ICD-10-CM | POA: Diagnosis not present

## 2021-10-02 DIAGNOSIS — Z7984 Long term (current) use of oral hypoglycemic drugs: Secondary | ICD-10-CM | POA: Diagnosis not present

## 2021-10-09 ENCOUNTER — Ambulatory Visit: Payer: Medicare PPO | Admitting: Cardiology

## 2021-10-09 ENCOUNTER — Encounter: Payer: Self-pay | Admitting: Cardiology

## 2021-10-09 ENCOUNTER — Other Ambulatory Visit: Payer: Self-pay

## 2021-10-09 VITALS — BP 120/60 | HR 90 | Ht 67.0 in | Wt 173.2 lb

## 2021-10-09 DIAGNOSIS — Z7901 Long term (current) use of anticoagulants: Secondary | ICD-10-CM

## 2021-10-09 DIAGNOSIS — E782 Mixed hyperlipidemia: Secondary | ICD-10-CM

## 2021-10-09 DIAGNOSIS — I5032 Chronic diastolic (congestive) heart failure: Secondary | ICD-10-CM | POA: Diagnosis not present

## 2021-10-09 DIAGNOSIS — I951 Orthostatic hypotension: Secondary | ICD-10-CM | POA: Diagnosis not present

## 2021-10-09 DIAGNOSIS — I4821 Permanent atrial fibrillation: Secondary | ICD-10-CM | POA: Diagnosis not present

## 2021-10-09 DIAGNOSIS — I110 Hypertensive heart disease with heart failure: Secondary | ICD-10-CM

## 2021-10-09 DIAGNOSIS — E119 Type 2 diabetes mellitus without complications: Secondary | ICD-10-CM

## 2021-10-09 NOTE — Patient Instructions (Signed)

## 2021-10-09 NOTE — Progress Notes (Signed)
Cardiology Office Note:    Date:  10/09/2021   ID:  Lori Rodriguez, DOB 1935-03-12, MRN [id number]  PCP:  Earlyne Iba, NP  Cardiologist:  Shirlee More, MD    Referring MD: Earlyne Iba, NP    ASSESSMENT:    1. Permanent atrial fibrillation ([place])   2. Long term (current) use of anticoagulants   3. Hypertensive heart disease with chronic diastolic congestive heart failure ([place])   4. Orthostatic hypotension   5. Mixed hyperlipidemia   6. Type 2 diabetes mellitus without complication, without long-term current use of insulin (HCC)    PLAN:    In order of problems listed above:  She is doing well rate controlled atrial fibrillation with her beta-blocker and tolerates her anticoagulant without bleeding complication. Stable continue Blood pressure is in good range well-controlled and no symptomatic hypotension she has no fluid overload heart failure compensated continue her loop diuretic and beta-blocker. Stable no recurrence since we adjusted her antihypertensive medications Lipids at target her statin for hyperlipidemia Stable diabetes A1c at target   Next appointment: She is doing so well I think I can see her in the office 1 year   Medication Adjustments/Labs and Tests Ordered: Current medicines are reviewed at length with the patient today.  Concerns regarding medicines are outlined above.  No orders of the defined types were placed in this encounter.  No orders of the defined types were placed in this encounter.   Chief Complaint  Patient presents with   Follow-up   Atrial Fibrillation   Hypotension   Hypertension    History of Present Illness:    Lori Rodriguez is a 87 y.o. female with a hx of chronic atrial fibrillation with anticoagulation hypertensive heart disease with heart failure anemia and orthostatic hypotension last seen 07/10/2021.  Her echocardiogram 01/23/2021 showed normal left ventricular size wall thickness EF 55 to 60% the  right ventricle is mildly enlarged with mild hypokinesia moderate elevation of pulmonary artery systolic pressure in the left atrium was enlarged.  Compliance with diet, lifestyle and medications: Yes  She trends her blood pressure normally runs 120-130/60-70  not having symptomatic hypotension She has not had any chest pain edema shortness of breath palpitation or syncope.  Most recent labs performed 09/04/2021 shows her lipids at target LDL 80 cholesterol 147 triglycerides 99 HDL 49, A1c is good at 6.5% as is her hemoglobin 12.5 creatinine 0.77 potassium 4.3.  She is having foot pain and is on crutches seen for gout and gouty arthritis. Past Medical History:  Diagnosis Date   Anemia    Anginal pain (HCC)    Atrial fibrillation ([place])    Cancer (HCC)    Left Breast Cancer   Cancer of central portion of left female breast ([place]) 03/10/2016   CHF (congestive heart failure) (HCC)    Chronic back pain    Chronic kidney disease    Right Renal Mass   Diabetes mellitus without complication (HCC)    DJD (degenerative joint disease)    Dysrhythmia    Atrial fibrillation   Encounter for preprocedural cardiovascular examination 02/28/2016   Formatting of this note might be different from the original. Overview:  Remote normal coronary arteriography and echo 2015 with normal EF% Formatting of this note might be different from the original. Remote normal coronary arteriography and echo 2015 with normal EF%   Estrogen receptor positive neoplasm 03/10/2016   Hypertension    Hypertensive heart disease 02/28/2016   Long  term (current) use of anticoagulants 02/29/2016   Overview:  Overview:  apixaban Overview:  apixaban  Formatting of this note might be different from the original. Overview:  apixaban Formatting of this note might be different from the original. apixaban   Mixed hyperlipidemia 02/29/2016   Orthostatic hypotension 12/01/2018   Osteoporosis    Persistent atrial fibrillation ([place]) 02/28/2016    Overview:  Overview:  CHADS2 vasc score= 4  Overview:  CHADS2 vasc score= 4 Overview:  CHADS2 vasc score= 4  Formatting of this note might be different from the original. Overview:  CHADS2 vasc score= 4  Overview:  CHADS2 vasc score= 4 Formatting of this note might be different from the original. CHADS2 vasc score= 4   Right renal mass    Secondary pulmonary arterial hypertension ([place]) 11/03/2018   Type 2 diabetes mellitus ([address]) 02/29/2016   Vitamin B12 deficiency    Vitamin D deficiency     Past Surgical History:  Procedure Laterality Date   BREAST LUMPECTOMY Left    CARDIAC CATHETERIZATION     CATARACT EXTRACTION, BILATERAL     CHOLECYSTECTOMY     IR RADIOLOGIST EVAL & MGMT  09/29/2018   IR RADIOLOGIST EVAL & MGMT  05/12/2019   IR RADIOLOGIST EVAL & MGMT  05/22/2020   TOTAL HIP ARTHROPLASTY Bilateral    TOTAL HIP REVISION Right 01/24/2021   Procedure: TOTAL HIP REVISION;  Surgeon: Rod Can, MD;  Location: [facility];  Service: Orthopedics;  Laterality: Right;    Current Medications: Current Meds  Medication Sig   acetaminophen (TYLENOL) 325 MG tablet Take 1-2 tablets (325-650 mg total) by mouth every 6 (six) hours as needed for mild pain (pain score 1-3 or temp > 100.5).   albuterol (PROVENTIL HFA;VENTOLIN HFA) 108 (90 Base) MCG/ACT inhaler Inhale 2 puffs into the lungs 4 (four) times daily as needed for wheezing or shortness of breath.   Calcium Carbonate-Vitamin D (CALCIUM-VITAMIN D3) 600-125 MG-UNIT TABS Take 1 capsule by mouth daily.   colchicine 0.6 MG tablet Take 0.6 mg by mouth daily.   Cyanocobalamin (VITAMIN B-12) 5000 MCG TBDP Take 5,000 mcg by mouth daily. Take 2 tablets daily   ferrous sulfate 325 (65 FE) MG tablet Take 65 mg by mouth every other day.   fluticasone furoate-vilanterol (BREO ELLIPTA) 100-25 MCG/INH AEPB Inhale 1 puff into the lungs daily.   glipiZIDE (GLUCOTROL XL) 5 MG 24 hr tablet Take 5 mg by mouth daily.   labetalol (NORMODYNE) 100 MG tablet Take 0.5  tablets (50 mg total) by mouth 2 (two) times daily.   loperamide (IMODIUM A-D) 2 MG tablet Take 2 mg by mouth 4 (four) times daily as needed for diarrhea or loose stools.   metFORMIN (GLUCOPHAGE) 1000 MG tablet Take 1,000 mg by mouth 2 (two) times daily with a meal.   ondansetron (ZOFRAN) 4 MG tablet Take 4 mg by mouth every 8 (eight) hours as needed for nausea or vomiting.   OVER THE COUNTER MEDICATION Take 1 tablet by mouth daily. Pro-15 5 billion cfu 15 strains/   OVER THE COUNTER MEDICATION Take 2 tablets by mouth at bedtime. Stool Softner/ Unknown strength   rOPINIRole (REQUIP) 0.25 MG tablet Take 0.25 mg by mouth at bedtime.   rosuvastatin (CRESTOR) 40 MG tablet Take 40 mg by mouth daily.   sertraline (ZOLOFT) 100 MG tablet Take 100 mg by mouth daily.    tolterodine (DETROL LA) 4 MG 24 hr capsule Take 4 mg by mouth every morning.   torsemide (  DEMADEX) 20 MG tablet Take one tablet by mouth daily in the morning. Take an extra tablet in the evening if you weigh 174 or greater.   traMADol (ULTRAM) 50 MG tablet Take 50 mg by mouth 2 (two) times daily.   Vitamin D, Ergocalciferol, (DRISDOL) 1.25 MG (50000 UNIT) CAPS capsule Take 50,000 Units by mouth every 7 (seven) days.   XARELTO 20 MG TABS tablet TAKE ONE TABLET BY MOUTH AT BEDTIME     Allergies:   Lisinopril, Percocet [oxycodone-acetaminophen], Valium [diazepam], and Prednisone   Social History   Socioeconomic History   Marital status: Widowed    Spouse name: Not on file   Number of children: Not on file   Years of education: Not on file   Highest education level: Not on file  Occupational History   Not on file  Tobacco Use   Smoking status: Never    Passive exposure: Never   Smokeless tobacco: Never  Vaping Use   Vaping Use: Never used  Substance and Sexual Activity   Alcohol use: Never   Drug use: Never   Sexual activity: Not on file  Other Topics Concern   Not on file  Social History Narrative   Not on file   Social  Determinants of Health   Financial Resource Strain: Not on file  Food Insecurity: Not on file  Transportation Needs: Not on file  Physical Activity: Not on file  Stress: Not on file  Social Connections: Not on file     Family History: The patient's family history includes Diabetes in her brother; Heart attack in her brother and father; Myasthenia gravis in her brother. ROS:   Please see the history of present illness.    All other systems reviewed and are negative.  EKGs/Labs/Other Studies Reviewed:    The following studies were reviewed today:  Recent Labs: 01/22/2021: ALT 13 07/01/2021: Hemoglobin 11.4; Platelets 255 07/10/2021: BUN 20; Creatinine, Ser 0.76; NT-Pro BNP 1,295; Potassium 4.3; Sodium 137  Recent Lipid Panel No results found for: CHOL, TRIG, HDL, CHOLHDL, VLDL, LDLCALC, LDLDIRECT  Physical Exam:    VS:  BP 120/60 (BP Location: Left Arm)    Pulse 90    Ht 5\' 7"  (1.702 m)    Wt 173 lb 3.2 oz (78.6 kg)    SpO2 97%    BMI 27.13 kg/m     Wt Readings from Last 3 Encounters:  10/09/21 173 lb 3.2 oz (78.6 kg)  07/10/21 171 lb (77.6 kg)  07/01/21 175 lb 3.2 oz (79.5 kg)     GEN:  Well nourished, well developed in no acute distress HEENT: Normal NECK: No JVD; No carotid bruits LYMPHATICS: No lymphadenopathy CARDIAC: Irregular rate and rhythm RRR, no murmurs, rubs, gallops RESPIRATORY:  Clear to auscultation without rales, wheezing or rhonchi  ABDOMEN: Soft, non-tender, non-distended MUSCULOSKELETAL:  No edema; No deformity  SKIN: Warm and dry NEUROLOGIC:  Alert and oriented x 3 PSYCHIATRIC:  Normal affect    Signed, Shirlee More, MD  10/09/2021 12:55 PM    [place]

## 2021-10-28 DIAGNOSIS — M19079 Primary osteoarthritis, unspecified ankle and foot: Secondary | ICD-10-CM | POA: Diagnosis not present

## 2021-10-28 DIAGNOSIS — E1142 Type 2 diabetes mellitus with diabetic polyneuropathy: Secondary | ICD-10-CM | POA: Diagnosis not present

## 2021-10-28 DIAGNOSIS — M79672 Pain in left foot: Secondary | ICD-10-CM | POA: Diagnosis not present

## 2021-11-21 ENCOUNTER — Other Ambulatory Visit: Payer: Self-pay

## 2021-11-21 NOTE — Progress Notes (Cosign Needed)
[place]  [address] [place],  [place]  [address] [phone]  Clinic Day:  11/22/2021  Referring physician: Penelope Coop, FNP  ASSESSMENT & PLAN:   Assessment & Plan: Malignant neoplasm of upper-inner quadrant of left female breast (Lori Rodriguez) Stage IA hormone receptor breast cancer diagnosed in February 2015.  She was treated with surgery and adjuvant hormonal therapy.  She completed 5 years of anastrozole in May 2020. She remains without evidence of recurrence. She will be due for mammogram and follow-up with Dr. Noberto Retort in August.  We will plan to see her back in 1 year for repeat examination.   The patient understands the plans discussed today and is in agreement with them.  She knows to contact our office if she develops concerns prior to her next appointment.      Marvia Pickles, PA-C  [facility] AT [facility] [address] [place] [address] Dept: [phone] Dept Fax: [phone]   No orders of the defined types were placed in this encounter.     CHIEF COMPLAINT:  CC: History of stage IA hormone receptor positive breast cancer  Current Treatment: Observation  HISTORY OF PRESENT ILLNESS:  Lori Rodriguez is a 87 y.o. female with a history of stage IA (T1c N0 M0) hormone receptor positive left breast cancer diagnosed in February 2015.  She was treated with lumpectomy.  Pathology revealed a 1.7 cm, grade 1, invasive ductal carcinoma in the background of low-grade ductal carcinoma in situ.  Four sentinel nodes were negative for metastasis.  Estrogen and progesterone receptors were positive and her 2 Neu negative.  Ki-67 was 13%.  She had multiple favorable prognostic characteristics, so adjuvant chemotherapy was not recommended.  She declined adjuvant radiation therapy.  She was placed on anastrozole 1 mg daily in May 2015.  She was hospitalized in October 2017 with  pneumonia.  CTA chest revealed interstitial edema versus infection, as well as areas of atelectasis and a right thyroid nodule, which was small and benign.  Bone density scan in June 2017 was normal.   She has remained without evidence of recurrent disease.  She had a right hip replacement in July 2017.  She underwent a left hip replacement in May 2018.   Bone density scan in March 2020 remained normal.  Since her bone density scan was normal even after nearly 5 years of anastrozole, we probably do not need to repeat this again.  She completed 5 years of anastrozole in May 2020.  Annual screening mammograms have remained without evidence of malignancy.   Abdominal ultrasound in October 2019 was done due to abdominal pain and revealed a solid-appearing lesion of the right kidney.  She therefore underwent CT abdomen in November 2019 which revealed a 1.9 cm solid lesion of the right kidney, as well as a 1.2 cm solid lesion of the right kidney that had increased in size.  There was a 4.8 cm cyst of the left kidney which was a Bosniak 1.  She had seen Dr. Nila Nephew who referred her to Dr. Aletta Edouard to discuss possible cryoablation.  She was able to have the MRI abdomen in August 2020 which revealed 2 solid enhancing masses of the right kidney.  The lesion in the right mid kidney was essentially stable, measuring 2.1 x 2 cm.  The lesion in the right upper pole had minimally increased in size measuring, 1.6 x 1.3 cm, previously 1.5  x 1.2 cm.  She states that Dr. Kathlene Cote did not recommend ablation at that time.  She has heart failure and atrial fibrillation and is followed by [facility].  She states she is back on diuretic and continues metoprolol and rivaroxaban.  She had a CT scan in June 2021, which did not even show definite renal masses, but they describe perinephric stranding.  She did have a follow up thyroid ultrasound from March 2021 revealed no significant interval change in the size, appearance or  characteristics of the 1.8 cm. Recommend 1 additional follow-up ultrasound in October 2022 to confirm 5 year stability and thus benignity.  She has had bilateral hip replacements.  INTERVAL HISTORY:  Lori Rodriguez is here today for repeat clinical assessment and states she has been doing fairly well.  She felt in April 2022 and fractured her hip replacement.  She had surgery for this and was in rehab for 2 months.  She then dislocated the right hip.  She had an episode of gout.  She fell again in August and hurt her left foot and ankle, which is improving.  She has difficulty ambulating and is using a cane.  She denies any changes in her breasts. She denies fevers or chills. She states her pain is fairly well controlled with tramadol twice daily. Her appetite is good. Her weight has been stable.  She has not had a repeat thyroid ultrasound, but that is probably because of everything else she has been going through.  I will contact her primary care to check on this to check on this.  REVIEW OF SYSTEMS:  Review of Systems  Constitutional:  Negative for appetite change, chills, fatigue, fever and unexpected weight change.  HENT:   Negative for lump/mass, mouth sores and sore throat.   Respiratory:  Negative for cough and shortness of breath.   Cardiovascular:  Negative for chest pain and leg swelling.  Gastrointestinal:  Negative for abdominal pain, constipation, diarrhea, nausea and vomiting.  Endocrine: Negative for hot flashes.  Genitourinary:  Negative for difficulty urinating, dysuria, frequency and hematuria.   Musculoskeletal:  Positive for gait problem (Uses cane). Negative for arthralgias, back pain and myalgias.  Skin:  Negative for rash.  Neurological:  Positive for gait problem (Uses cane). Negative for dizziness and headaches.  Hematological:  Negative for adenopathy. Does not bruise/bleed easily.  Psychiatric/Behavioral:  Negative for depression and sleep disturbance. The patient is not  nervous/anxious.     VITALS:  Blood pressure 131/62, pulse 81, temperature 98.1 F (36.7 C), temperature source Oral, resp. rate 18, height _0  (1.702 m), weight 173 lb 11.2 oz (78.8 kg), SpO2 95 %.  Wt Readings from Last 3 Encounters:  11/22/21 173 lb 11.2 oz (78.8 kg)  10/09/21 173 lb 3.2 oz (78.6 kg)  07/10/21 171 lb (77.6 kg)    Body mass index is 27.21 kg/m.  Performance status (ECOG): 2 - Symptomatic, <50% confined to bed  PHYSICAL EXAM:  Physical Exam Vitals and nursing note reviewed.  Constitutional:      General: She is not in acute distress.    Appearance: Normal appearance.  HENT:     Head: Normocephalic and atraumatic.     Mouth/Throat:     Mouth: Mucous membranes are moist.     Pharynx: Oropharynx is clear. No oropharyngeal exudate or posterior oropharyngeal erythema.  Eyes:     General: No scleral icterus.    Extraocular Movements: Extraocular movements intact.     Conjunctiva/sclera: Conjunctivae normal.  Pupils: Pupils are equal, round, and reactive to light.  Cardiovascular:     Rate and Rhythm: Normal rate and regular rhythm.     Heart sounds: Normal heart sounds. No murmur heard.   No friction rub. No gallop.  Pulmonary:     Effort: Pulmonary effort is normal.     Breath sounds: Normal breath sounds. No wheezing, rhonchi or rales.  Chest:  Breasts:    Right: Normal. No swelling, bleeding, inverted nipple, mass, nipple discharge, skin change or tenderness.     Left: Inverted nipple (Chronic since surgery) present. No swelling, bleeding, mass, nipple discharge, skin change or tenderness.     Comments: There is stable fibrosis in the left breast Abdominal:     General: There is no distension.     Palpations: Abdomen is soft. There is no hepatomegaly, splenomegaly or mass.     Tenderness: There is no abdominal tenderness.  Musculoskeletal:        General: Normal range of motion.     Cervical back: Normal range of motion and neck supple. No  tenderness.     Right lower leg: No edema.     Left lower leg: No edema.  Lymphadenopathy:     Cervical: No cervical adenopathy.     Upper Body:     Right upper body: No supraclavicular or axillary adenopathy.     Left upper body: No supraclavicular or axillary adenopathy.     Lower Body: No right inguinal adenopathy. No left inguinal adenopathy.  Skin:    General: Skin is warm and dry.     Coloration: Skin is not jaundiced.     Findings: No rash.  Neurological:     Mental Status: She is alert and oriented to person, place, and time.     Cranial Nerves: No cranial nerve deficit.  Psychiatric:        Mood and Affect: Mood normal.        Behavior: Behavior normal.        Thought Content: Thought content normal.    LABS:   CBC Latest Ref Rng & Units 07/01/2021 01/29/2021 01/28/2021  WBC 3.4 - 10.8 x10E3/uL 9.6 - 12.8(H)  Hemoglobin 11.1 - 15.9 g/dL 11.4 8.3(L) 7.7(L)  Hematocrit 34.0 - 46.6 % 37.3 26.3(L) 24.0(L)  Platelets 150 - 450 x10E3/uL 255 - 285   CMP Latest Ref Rng & Units 07/10/2021 07/01/2021 01/26/2021  Glucose 70 - 99 mg/dL 270(H) 58(L) 180(H)  BUN 8 - 27 mg/dL 20 15 36(H)  Creatinine 0.57 - 1.00 mg/dL 0.76 0.66 1.03(H)  Sodium 134 - 144 mmol/L 137 141 133(L)  Potassium 3.5 - 5.2 mmol/L 4.3 4.1 4.1  Chloride 96 - 106 mmol/L 98 101 101  CO2 20 - 29 mmol/L 22 25 21(L)  Calcium 8.7 - 10.3 mg/dL 8.8 9.7 8.8(L)  Total Protein 6.5 - 8.1 g/dL - - -  Total Bilirubin 0.3 - 1.2 mg/dL - - -  Alkaline Phos 38 - 126 U/L - - -  AST 15 - 41 U/L - - -  ALT 0 - 44 U/L - - -     No results found for: CEA1 / No results found for: CEA1 No results found for: PSA1 No results found for: WUX324 No results found for: MWN027  No results found for: TOTALPROTELP, ALBUMINELP, A1GS, A2GS, BETS, BETA2SER, GAMS, MSPIKE, SPEI No results found for: TIBC, FERRITIN, IRONPCTSAT No results found for: LDH  STUDIES:  No results found.    HISTORY:  Past Medical History:  Diagnosis Date    Anemia    Anginal pain (HCC)    Atrial fibrillation ([place])    Cancer (HCC)    Left Breast Cancer   Cancer of central portion of left female breast ([place]) 03/10/2016   CHF (congestive heart failure) (HCC)    Chronic back pain    Chronic kidney disease    Right Renal Mass   Diabetes mellitus without complication (HCC)    DJD (degenerative joint disease)    Dysrhythmia    Atrial fibrillation   Encounter for preprocedural cardiovascular examination 02/28/2016   Formatting of this note might be different from the original. Overview:  Remote normal coronary arteriography and echo 2015 with normal EF% Formatting of this note might be different from the original. Remote normal coronary arteriography and echo 2015 with normal EF%   Estrogen receptor positive neoplasm 03/10/2016   Hypertension    Hypertensive heart disease 02/28/2016   Long term (current) use of anticoagulants 02/29/2016   Overview:  Overview:  apixaban Overview:  apixaban  Formatting of this note might be different from the original. Overview:  apixaban Formatting of this note might be different from the original. apixaban   Mixed hyperlipidemia 02/29/2016   Orthostatic hypotension 12/01/2018   Osteoporosis    Persistent atrial fibrillation ([place]) 02/28/2016   Overview:  Overview:  CHADS2 vasc score= 4  Overview:  CHADS2 vasc score= 4 Overview:  CHADS2 vasc score= 4  Formatting of this note might be different from the original. Overview:  CHADS2 vasc score= 4  Overview:  CHADS2 vasc score= 4 Formatting of this note might be different from the original. CHADS2 vasc score= 4   Right renal mass    Secondary pulmonary arterial hypertension ([place]) 11/03/2018   Type 2 diabetes mellitus ([place]) 02/29/2016   Vitamin B12 deficiency    Vitamin D deficiency     Past Surgical History:  Procedure Laterality Date   BREAST LUMPECTOMY Left    CARDIAC CATHETERIZATION     CATARACT EXTRACTION, BILATERAL     CHOLECYSTECTOMY     IR RADIOLOGIST EVAL & MGMT   09/29/2018   IR RADIOLOGIST EVAL & MGMT  05/12/2019   IR RADIOLOGIST EVAL & MGMT  05/22/2020   TOTAL HIP ARTHROPLASTY Bilateral    TOTAL HIP REVISION Right 01/24/2021   Procedure: TOTAL HIP REVISION;  Surgeon: Rod Can, MD;  Location: [facility];  Service: Orthopedics;  Laterality: Right;    Family History  Problem Relation Age of Onset   Heart attack Father    Heart attack Brother    Diabetes Brother    Myasthenia gravis Brother     Social History:  reports that she has never smoked. She has never been exposed to tobacco smoke. She has never used smokeless tobacco. She reports that she does not drink alcohol and does not use drugs.The patient is alone today.  Allergies:  Allergies  Allergen Reactions   Lisinopril Cough   Percocet [Oxycodone-Acetaminophen] Other (See Comments)    Hallucinations   Valium [Diazepam] Other (See Comments)    Confusion   Prednisone Anxiety and Other (See Comments)     Dizziness, weakness     Current Medications: Current Outpatient Medications  Medication Sig Dispense Refill   acidophilus (RISAQUAD) CAPS capsule Take 1 capsule by mouth daily.     acetaminophen (TYLENOL) 325 MG tablet Take 1-2 tablets (325-650 mg total) by mouth every 6 (six) hours as needed for mild pain (pain score 1-3 or temp >  100.5). 100 tablet 0   albuterol (PROVENTIL HFA;VENTOLIN HFA) 108 (90 Base) MCG/ACT inhaler Inhale 2 puffs into the lungs 4 (four) times daily as needed for wheezing or shortness of breath.     anastrozole (ARIMIDEX) 1 MG tablet Take by mouth. (Patient not taking: Reported on 11/22/2021)     Calcium Carbonate-Vitamin D (CALCIUM-VITAMIN D3) 600-125 MG-UNIT TABS Take 1 capsule by mouth daily.     colchicine 0.6 MG tablet Take by mouth.     Cyanocobalamin (VITAMIN B-12) 5000 MCG TBDP Take 5,000 mcg by mouth daily. Take 2 tablets daily     ferrous sulfate 325 (65 FE) MG tablet Take 65 mg by mouth every other day.     fluticasone furoate-vilanterol (BREO ELLIPTA)  100-25 MCG/INH AEPB Inhale 1 puff into the lungs daily.     glipiZIDE (GLUCOTROL XL) 5 MG 24 hr tablet Take 5 mg by mouth daily.     labetalol (NORMODYNE) 200 MG tablet Take by mouth daily. 1/2 tablet daily     loperamide (IMODIUM A-D) 2 MG tablet Take 2 mg by mouth 4 (four) times daily as needed for diarrhea or loose stools.     metFORMIN (GLUCOPHAGE) 1000 MG tablet Take 1,000 mg by mouth 2 (two) times daily with a meal.     ondansetron (ZOFRAN) 4 MG tablet Take 4 mg by mouth every 8 (eight) hours as needed for nausea or vomiting.     OVER THE COUNTER MEDICATION Take 1 tablet by mouth daily. Pro-15 5 billion cfu 15 strains/     OVER THE COUNTER MEDICATION Take 2 tablets by mouth at bedtime. Stool Softner/ Unknown strength     oxybutynin (DITROPAN-XL) 10 MG 24 hr tablet Take 1 mg by mouth. Taking 18m twice a day     rOPINIRole (REQUIP) 0.25 MG tablet Take by mouth.     rosuvastatin (CRESTOR) 40 MG tablet Take 40 mg by mouth daily.     sertraline (ZOLOFT) 100 MG tablet Take 100 mg by mouth daily.      tolterodine (DETROL LA) 4 MG 24 hr capsule Take 4 mg by mouth every morning.     torsemide (DEMADEX) 20 MG tablet Take one tablet by mouth daily in the morning. Take an extra tablet in the evening if you weigh 174 or greater. 60 tablet 1   traMADol (ULTRAM) 50 MG tablet Take 50 mg by mouth 2 (two) times daily.     TRUE METRIX BLOOD GLUCOSE TEST test strip SMARTSIG:Via Meter     Vitamin D, Ergocalciferol, (DRISDOL) 1.25 MG (50000 UNIT) CAPS capsule Take 50,000 Units by mouth every 7 (seven) days.     XARELTO 20 MG TABS tablet TAKE ONE TABLET BY MOUTH AT BEDTIME 90 tablet 1   No current facility-administered medications for this visit.

## 2021-11-21 NOTE — Assessment & Plan Note (Addendum)
Stage IA hormone receptor breast cancer diagnosed in February 2015.  She was treated with surgery and adjuvant hormonal therapy.  She completed 5 years of anastrozole in May 2020. She remains without evidence of recurrence. She will be due for mammogram and follow-up with Dr. Noberto Retort in August.  We will plan to see her back in 1 year for repeat examination. ?

## 2021-11-22 ENCOUNTER — Inpatient Hospital Stay: Payer: Medicare PPO | Attending: Hematology and Oncology | Admitting: Hematology and Oncology

## 2021-11-22 ENCOUNTER — Encounter: Payer: Self-pay | Admitting: Hematology and Oncology

## 2021-11-22 ENCOUNTER — Other Ambulatory Visit: Payer: Self-pay

## 2021-11-22 DIAGNOSIS — Z170 Estrogen receptor positive status [ER+]: Secondary | ICD-10-CM

## 2021-11-22 DIAGNOSIS — C50212 Malignant neoplasm of upper-inner quadrant of left female breast: Secondary | ICD-10-CM | POA: Diagnosis not present

## 2021-11-25 ENCOUNTER — Encounter: Payer: Self-pay | Admitting: Hematology and Oncology

## 2021-11-29 ENCOUNTER — Other Ambulatory Visit: Payer: Self-pay | Admitting: Cardiology

## 2021-11-29 NOTE — Telephone Encounter (Signed)
Prescription refill request for Xarelto received.  ?Indication:Afib ?Last office visit:1/23 ?Weight:78.8 kg ?Age:87 ?Scr:0.7 ?CrCl:74.42 ml/min ? ?Prescription refilled ? ?

## 2021-12-04 DIAGNOSIS — D509 Iron deficiency anemia, unspecified: Secondary | ICD-10-CM | POA: Diagnosis not present

## 2021-12-04 DIAGNOSIS — R946 Abnormal results of thyroid function studies: Secondary | ICD-10-CM | POA: Diagnosis not present

## 2021-12-04 DIAGNOSIS — I4891 Unspecified atrial fibrillation: Secondary | ICD-10-CM | POA: Diagnosis not present

## 2021-12-04 DIAGNOSIS — Z79899 Other long term (current) drug therapy: Secondary | ICD-10-CM | POA: Diagnosis not present

## 2021-12-04 DIAGNOSIS — I10 Essential (primary) hypertension: Secondary | ICD-10-CM | POA: Diagnosis not present

## 2021-12-04 DIAGNOSIS — E1169 Type 2 diabetes mellitus with other specified complication: Secondary | ICD-10-CM | POA: Diagnosis not present

## 2021-12-04 DIAGNOSIS — M25551 Pain in right hip: Secondary | ICD-10-CM | POA: Diagnosis not present

## 2021-12-04 DIAGNOSIS — Z6828 Body mass index (BMI) 28.0-28.9, adult: Secondary | ICD-10-CM | POA: Diagnosis not present

## 2021-12-04 DIAGNOSIS — E785 Hyperlipidemia, unspecified: Secondary | ICD-10-CM | POA: Diagnosis not present

## 2021-12-06 ENCOUNTER — Other Ambulatory Visit: Payer: Self-pay

## 2021-12-06 ENCOUNTER — Telehealth: Payer: Self-pay

## 2021-12-06 ENCOUNTER — Encounter (HOSPITAL_COMMUNITY): Payer: Self-pay

## 2021-12-06 ENCOUNTER — Emergency Department (HOSPITAL_COMMUNITY)
Admission: EM | Admit: 2021-12-06 | Discharge: 2021-12-06 | Disposition: A | Discharge status: Home / Self Care | Payer: Medicare PPO | Attending: Emergency Medicine | Admitting: Emergency Medicine

## 2021-12-06 ENCOUNTER — Emergency Department (HOSPITAL_COMMUNITY): Payer: Medicare PPO

## 2021-12-06 DIAGNOSIS — S0990XA Unspecified injury of head, initial encounter: Secondary | ICD-10-CM

## 2021-12-06 DIAGNOSIS — Z7901 Long term (current) use of anticoagulants: Secondary | ICD-10-CM | POA: Diagnosis not present

## 2021-12-06 DIAGNOSIS — S7001XA Contusion of right hip, initial encounter: Secondary | ICD-10-CM | POA: Diagnosis not present

## 2021-12-06 DIAGNOSIS — W01198A Fall on same level from slipping, tripping and stumbling with subsequent striking against other object, initial encounter: Secondary | ICD-10-CM | POA: Insufficient documentation

## 2021-12-06 DIAGNOSIS — Z96641 Presence of right artificial hip joint: Secondary | ICD-10-CM | POA: Diagnosis not present

## 2021-12-06 DIAGNOSIS — S0083XA Contusion of other part of head, initial encounter: Secondary | ICD-10-CM | POA: Diagnosis not present

## 2021-12-06 DIAGNOSIS — M25551 Pain in right hip: Secondary | ICD-10-CM | POA: Diagnosis not present

## 2021-12-06 DIAGNOSIS — S0003XA Contusion of scalp, initial encounter: Secondary | ICD-10-CM | POA: Diagnosis not present

## 2021-12-06 NOTE — ED Provider Notes (Signed)
?[facility] DEPT ?Provider Note ? ? ?CSN: [id number] ?Arrival date & time: 12/06/21  1216 ? ?  ? ?History ? ?Chief Complaint  ?Patient presents with  ? Fall  ? ? ?Lori Rodriguez is a 87 y.o. female. ? ?87 yo F with a chief complaint of a fall.  Yesterday she was on her scale and she had stepped backwards and lost her footing and fell.  Struck the back of her head as well as the right shoulder.  She went to see her orthopedist today for her chronic right hip pain and was told that she should likely come here to be evaluated for her head trauma.  She denies confusion denies vomiting.  Has a mild headache.  Denies neck pain denies back pain other than her chronic. ? ? ?Fall ? ? ?  ? ?Home Medications ?Prior to Admission medications   ?Medication Sig Start Date End Date Taking? Authorizing Provider  ?acetaminophen (TYLENOL) 325 MG tablet Take 1-2 tablets (325-650 mg total) by mouth every 6 (six) hours as needed for mild pain (pain score 1-3 or temp > 100.5). 01/25/21   Dorothyann Peng, PA-C  ?acidophilus (RISAQUAD) CAPS capsule Take 1 capsule by mouth daily.    [provider]  ?albuterol (PROVENTIL HFA;VENTOLIN HFA) 108 (90 Base) MCG/ACT inhaler Inhale 2 puffs into the lungs 4 (four) times daily as needed for wheezing or shortness of breath.    [provider]  ?anastrozole (ARIMIDEX) 1 MG tablet Take by mouth. ?Patient not taking: Reported on 11/22/2021    [provider]  ?Calcium Carbonate-Vitamin D (CALCIUM-VITAMIN D3) 600-125 MG-UNIT TABS Take 1 capsule by mouth daily.    [provider]  ?colchicine 0.6 MG tablet Take by mouth. 10/29/21   [provider]  ?Cyanocobalamin (VITAMIN B-12) 5000 MCG TBDP Take 5,000 mcg by mouth daily. Take 2 tablets daily    [provider]  ?ferrous sulfate 325 (65 FE) MG tablet Take 65 mg by mouth every other day.    [provider]  ?fluticasone furoate-vilanterol (BREO ELLIPTA) 100-25  MCG/INH AEPB Inhale 1 puff into the lungs daily. 03/17/18   [provider]  ?glipiZIDE (GLUCOTROL XL) 5 MG 24 hr tablet Take 5 mg by mouth daily. 12/19/16   [provider]  ?labetalol (NORMODYNE) 200 MG tablet Take by mouth daily. 1/2 tablet daily 07/05/21   [provider]  ?loperamide (IMODIUM A-D) 2 MG tablet Take 2 mg by mouth 4 (four) times daily as needed for diarrhea or loose stools.    [provider]  ?metFORMIN (GLUCOPHAGE) 1000 MG tablet Take 1,000 mg by mouth 2 (two) times daily with a meal.    [provider]  ?ondansetron (ZOFRAN) 4 MG tablet Take 4 mg by mouth every 8 (eight) hours as needed for nausea or vomiting.    [provider]  ?OVER THE COUNTER MEDICATION Take 1 tablet by mouth daily. Pro-15 5 billion cfu 15 strains/    [provider]  ?OVER THE COUNTER MEDICATION Take 2 tablets by mouth at bedtime. Stool Softner/ Unknown strength    [provider]  ?oxybutynin (DITROPAN-XL) 10 MG 24 hr tablet Take 1 mg by mouth. Taking '5mg'$  twice a day    [provider]  ?rivaroxaban (XARELTO) 20 MG TABS tablet TAKE ONE TABLET BY MOUTH ONCE DAILY 11/29/21   Richardo Priest, MD  ?rOPINIRole (REQUIP) 0.25 MG tablet Take by mouth. 06/19/21   [provider]  ?  rosuvastatin (CRESTOR) 40 MG tablet Take 40 mg by mouth daily. 06/19/20   [provider]  ?sertraline (ZOLOFT) 100 MG tablet Take 100 mg by mouth daily.     [provider]  ?tolterodine (DETROL LA) 4 MG 24 hr capsule Take 4 mg by mouth every morning. 05/03/20   [provider]  ?torsemide (DEMADEX) 20 MG tablet Take one tablet by mouth daily in the morning. Take an extra tablet in the evening if you weigh 174 or greater. 07/10/21   Richardo Priest, MD  ?traMADol (ULTRAM) 50 MG tablet Take 50 mg by mouth 2 (two) times daily.    [provider]  ?TRUE METRIX BLOOD GLUCOSE TEST test strip SMARTSIG:Via Meter 10/28/21   [provider]  ?Vitamin D, Ergocalciferol, (DRISDOL) 1.25 MG (50000 UNIT) CAPS capsule Take 50,000 Units by mouth every 7 (seven) days.    [provider]  ?   ? ?Allergies    ?Lisinopril, Percocet [oxycodone-acetaminophen], Valium [diazepam], and Prednisone   ? ?Review of Systems   ?Review of Systems ? ?Physical Exam ?Updated Vital Signs ?BP 136/80   Pulse 82   Temp 98.8 ?F (37.1 ?C) (Oral)   Resp 17   Ht '5\' 7"'$  (1.702 m)   Wt 79.8 kg   SpO2 94%   BMI 27.57 kg/m?  ?Physical Exam ?Vitals and nursing note reviewed.  ?Constitutional:   ?   General: She is not in acute distress. ?   Appearance: She is well-developed. She is not diaphoretic.  ?HENT:  ?   Head: Normocephalic.  ?   Comments: Right parietal hematoma.  No laceration.  No midline C-spine tenderness able to rotate her head 45 degrees in either direction without pain. ?Eyes:  ?   Pupils: Pupils are equal, round, and reactive to light.  ?Cardiovascular:  ?   Rate and Rhythm: Normal rate and regular rhythm.  ?   Heart sounds: No murmur heard. ?  No friction rub. No gallop.  ?Pulmonary:  ?   Effort: Pulmonary effort is normal.  ?   Breath sounds: No wheezing or rales.  ?Abdominal:  ?   General: There is no distension.  ?   Palpations: Abdomen is soft.  ?   Tenderness: There is no abdominal tenderness.  ?Musculoskeletal:     ?   General: No tenderness.  ?   Cervical back: Normal range of motion and neck supple.  ?   Comments: Palpated from head to toe without any other noted areas of bony tenderness.  ?Skin: ?   General: Skin is warm and dry.  ?Neurological:  ?   Mental Status: She is alert and oriented to person, place, and time.  ?Psychiatric:     ?   Behavior: Behavior normal.  ? ? ?ED Results / Procedures / Treatments   ?Labs ?(all labs ordered are listed, but only abnormal results are displayed) ?Labs Reviewed - No data to display ? ?EKG ?None ? ?Radiology ?CT Head Wo Contrast ? ?Result Date: 12/06/2021 ?CLINICAL DATA:  Head trauma, minor (Age  >= 65y) EXAM: CT HEAD WITHOUT CONTRAST TECHNIQUE: Contiguous axial images were obtained from the base of the skull through the vertex without intravenous contrast. RADIATION DOSE REDUCTION: This exam was performed according to the departmental dose-optimization program which includes automated exposure control, adjustment of the mA and/or kV according to patient size and/or use of iterative reconstruction technique. COMPARISON:  CT head January 19, 2021. FINDINGS: Brain: No evidence of acute infarction,  hemorrhage, hydrocephalus, extra-axial collection or mass lesion/mass effect. Moderate patchy white matter hypoattenuation, nonspecific but compatible with chronic microvascular ischemic disease. Vascular: Calcific intracranial atherosclerosis. No hyperdense vessel identified. Skull: Right frontal scalp contusion without acute calvarial fracture. Sinuses/Orbits: Largely clear sinuses.  No acute orbital findings. Other: No mastoid effusions. IMPRESSION: 1. No evidence of acute intracranial abnormality. 2. Right frontal scalp contusion without acute calvarial fracture. 3. Chronic microvascular ischemic disease. Electronically Signed   By: Margaretha Sheffield M.D.   On: 12/06/2021 13:17   ? ?Procedures ?Procedures  ? ? ?Medications Ordered in ED ?Medications - No data to display ? ?ED Course/ Medical Decision Making/ A&P ?  ?                        ?Medical Decision Making ?Amount and/or Complexity of Data Reviewed ?Radiology: ordered. ? ? ?87 yo F with a chief complaints of fall.  Nonsyncopal by history.  Complaining of headache and right hip pain.  Her hip pain is chronic and she had seen orthopedist today who told her it was likely her back.  She is a bit confused about this but came here for CT imaging of the head.  She has no obvious neurologic complaints.  She is on Xarelto.  We will obtain a CT of the head.  She has no C-spine tenderness is able to rotate her head 45 degrees in either direction without pain.  Do not  feel that CT imaging is warranted of the C-spine. ? ?CT scan of the head is negative for intracranial pathology on my independent interpretation.  Radiology read negative.  Will discharge home.  PCP follow-up. ? ?2:3

## 2021-12-06 NOTE — ED Triage Notes (Signed)
Patient states she fell yesterday while stepping off of the scale. Patient reports hitting her head on a cabinet and bruising to the right shoulder. ?Patient is on Xarelto. ?

## 2021-12-06 NOTE — Telephone Encounter (Signed)
Last note faxed to Orlinda Blalock NP [phone], also spoke with Missy in office there patient needs thyroid ultrasound not done in October 2022.per Rosanne Sack PAC ?

## 2021-12-06 NOTE — Discharge Instructions (Signed)
Your CT scan did not show any bleeding inside the skull.  Please follow-up with your family doctor in the office.  Although it is rare, there is a chance that she could have some bleeding inside the skull up to a week after the injury.  Please return if you have worsening headache confusion or nausea vomiting. ?

## 2021-12-10 DIAGNOSIS — M160 Bilateral primary osteoarthritis of hip: Secondary | ICD-10-CM | POA: Diagnosis not present

## 2021-12-10 DIAGNOSIS — M1611 Unilateral primary osteoarthritis, right hip: Secondary | ICD-10-CM | POA: Diagnosis not present

## 2021-12-10 DIAGNOSIS — M4807 Spinal stenosis, lumbosacral region: Secondary | ICD-10-CM | POA: Diagnosis not present

## 2021-12-10 DIAGNOSIS — E119 Type 2 diabetes mellitus without complications: Secondary | ICD-10-CM | POA: Diagnosis not present

## 2021-12-10 DIAGNOSIS — M25559 Pain in unspecified hip: Secondary | ICD-10-CM | POA: Diagnosis not present

## 2021-12-10 DIAGNOSIS — M25551 Pain in right hip: Secondary | ICD-10-CM | POA: Diagnosis not present

## 2021-12-10 DIAGNOSIS — E785 Hyperlipidemia, unspecified: Secondary | ICD-10-CM | POA: Diagnosis not present

## 2021-12-10 DIAGNOSIS — I10 Essential (primary) hypertension: Secondary | ICD-10-CM | POA: Diagnosis not present

## 2021-12-10 DIAGNOSIS — Z96643 Presence of artificial hip joint, bilateral: Secondary | ICD-10-CM | POA: Diagnosis not present

## 2021-12-10 DIAGNOSIS — M85851 Other specified disorders of bone density and structure, right thigh: Secondary | ICD-10-CM | POA: Diagnosis not present

## 2021-12-10 DIAGNOSIS — M47816 Spondylosis without myelopathy or radiculopathy, lumbar region: Secondary | ICD-10-CM | POA: Diagnosis not present

## 2021-12-10 DIAGNOSIS — Z043 Encounter for examination and observation following other accident: Secondary | ICD-10-CM | POA: Diagnosis not present

## 2021-12-10 DIAGNOSIS — M47817 Spondylosis without myelopathy or radiculopathy, lumbosacral region: Secondary | ICD-10-CM | POA: Diagnosis not present

## 2021-12-16 DIAGNOSIS — M5431 Sciatica, right side: Secondary | ICD-10-CM | POA: Diagnosis not present

## 2021-12-16 DIAGNOSIS — Z6827 Body mass index (BMI) 27.0-27.9, adult: Secondary | ICD-10-CM | POA: Diagnosis not present

## 2021-12-16 DIAGNOSIS — W19XXXA Unspecified fall, initial encounter: Secondary | ICD-10-CM | POA: Diagnosis not present

## 2021-12-16 DIAGNOSIS — E041 Nontoxic single thyroid nodule: Secondary | ICD-10-CM | POA: Diagnosis not present

## 2022-01-02 ENCOUNTER — Other Ambulatory Visit: Payer: Self-pay | Admitting: Cardiology

## 2022-01-21 ENCOUNTER — Other Ambulatory Visit: Payer: Self-pay

## 2022-01-21 MED ORDER — RIVAROXABAN 20 MG PO TABS: 20.0000 mg | ORAL_TABLET | Freq: Every day | ORAL | 2 refills | Status: DC

## 2022-01-21 NOTE — Telephone Encounter (Signed)
Request from [facility] for new Rx for Xarelto 20 mg , Patient requested this be sent  ?

## 2022-01-24 DIAGNOSIS — R3915 Urgency of urination: Secondary | ICD-10-CM | POA: Diagnosis not present

## 2022-01-24 DIAGNOSIS — R351 Nocturia: Secondary | ICD-10-CM | POA: Diagnosis not present

## 2022-01-24 DIAGNOSIS — N3020 Other chronic cystitis without hematuria: Secondary | ICD-10-CM | POA: Diagnosis not present

## 2022-01-30 DIAGNOSIS — I10 Essential (primary) hypertension: Secondary | ICD-10-CM | POA: Diagnosis not present

## 2022-01-30 DIAGNOSIS — Z961 Presence of intraocular lens: Secondary | ICD-10-CM | POA: Diagnosis not present

## 2022-01-30 DIAGNOSIS — H5203 Hypermetropia, bilateral: Secondary | ICD-10-CM | POA: Diagnosis not present

## 2022-01-30 DIAGNOSIS — Z7984 Long term (current) use of oral hypoglycemic drugs: Secondary | ICD-10-CM | POA: Diagnosis not present

## 2022-01-30 DIAGNOSIS — Z9849 Cataract extraction status, unspecified eye: Secondary | ICD-10-CM | POA: Diagnosis not present

## 2022-01-30 DIAGNOSIS — E119 Type 2 diabetes mellitus without complications: Secondary | ICD-10-CM | POA: Diagnosis not present

## 2022-01-30 DIAGNOSIS — H524 Presbyopia: Secondary | ICD-10-CM | POA: Diagnosis not present

## 2022-01-30 DIAGNOSIS — H52223 Regular astigmatism, bilateral: Secondary | ICD-10-CM | POA: Diagnosis not present

## 2022-02-04 DIAGNOSIS — Z6841 Body Mass Index (BMI) 40.0 and over, adult: Secondary | ICD-10-CM | POA: Diagnosis not present

## 2022-02-04 DIAGNOSIS — R030 Elevated blood-pressure reading, without diagnosis of hypertension: Secondary | ICD-10-CM | POA: Diagnosis not present

## 2022-02-04 DIAGNOSIS — M25571 Pain in right ankle and joints of right foot: Secondary | ICD-10-CM | POA: Diagnosis not present

## 2022-02-09 DIAGNOSIS — E119 Type 2 diabetes mellitus without complications: Secondary | ICD-10-CM | POA: Diagnosis not present

## 2022-02-09 DIAGNOSIS — E785 Hyperlipidemia, unspecified: Secondary | ICD-10-CM | POA: Diagnosis not present

## 2022-02-09 DIAGNOSIS — M25571 Pain in right ankle and joints of right foot: Secondary | ICD-10-CM | POA: Diagnosis not present

## 2022-02-09 DIAGNOSIS — I10 Essential (primary) hypertension: Secondary | ICD-10-CM | POA: Diagnosis not present

## 2022-02-09 DIAGNOSIS — Z7984 Long term (current) use of oral hypoglycemic drugs: Secondary | ICD-10-CM | POA: Diagnosis not present

## 2022-02-09 DIAGNOSIS — L03115 Cellulitis of right lower limb: Secondary | ICD-10-CM | POA: Diagnosis not present

## 2022-03-06 DIAGNOSIS — D509 Iron deficiency anemia, unspecified: Secondary | ICD-10-CM | POA: Diagnosis not present

## 2022-03-06 DIAGNOSIS — M1990 Unspecified osteoarthritis, unspecified site: Secondary | ICD-10-CM | POA: Diagnosis not present

## 2022-03-06 DIAGNOSIS — I4891 Unspecified atrial fibrillation: Secondary | ICD-10-CM | POA: Diagnosis not present

## 2022-03-06 DIAGNOSIS — E1169 Type 2 diabetes mellitus with other specified complication: Secondary | ICD-10-CM | POA: Diagnosis not present

## 2022-03-06 DIAGNOSIS — R946 Abnormal results of thyroid function studies: Secondary | ICD-10-CM | POA: Diagnosis not present

## 2022-03-06 DIAGNOSIS — Z139 Encounter for screening, unspecified: Secondary | ICD-10-CM | POA: Diagnosis not present

## 2022-03-06 DIAGNOSIS — Z6827 Body mass index (BMI) 27.0-27.9, adult: Secondary | ICD-10-CM | POA: Diagnosis not present

## 2022-03-06 DIAGNOSIS — E785 Hyperlipidemia, unspecified: Secondary | ICD-10-CM | POA: Diagnosis not present

## 2022-03-06 DIAGNOSIS — Z79899 Other long term (current) drug therapy: Secondary | ICD-10-CM | POA: Diagnosis not present

## 2022-03-06 DIAGNOSIS — I10 Essential (primary) hypertension: Secondary | ICD-10-CM | POA: Diagnosis not present

## 2022-03-12 DIAGNOSIS — L03116 Cellulitis of left lower limb: Secondary | ICD-10-CM | POA: Diagnosis not present

## 2022-03-12 DIAGNOSIS — E669 Obesity, unspecified: Secondary | ICD-10-CM | POA: Diagnosis not present

## 2022-03-12 DIAGNOSIS — Z6830 Body mass index (BMI) 30.0-30.9, adult: Secondary | ICD-10-CM | POA: Diagnosis not present

## 2022-04-02 ENCOUNTER — Other Ambulatory Visit: Payer: Self-pay | Admitting: Cardiology

## 2022-05-08 DIAGNOSIS — N3020 Other chronic cystitis without hematuria: Secondary | ICD-10-CM | POA: Diagnosis not present

## 2022-05-08 DIAGNOSIS — R3915 Urgency of urination: Secondary | ICD-10-CM | POA: Diagnosis not present

## 2022-05-08 DIAGNOSIS — R351 Nocturia: Secondary | ICD-10-CM | POA: Diagnosis not present

## 2022-05-12 ENCOUNTER — Other Ambulatory Visit: Payer: Self-pay

## 2022-05-12 MED ORDER — LABETALOL HCL 200 MG PO TABS: 200.0000 mg | ORAL_TABLET | Freq: Every day | ORAL | 2 refills | Status: DC

## 2022-05-12 NOTE — Telephone Encounter (Signed)
Labetalol 200 mg refill sen to pharmacy

## 2022-05-13 DIAGNOSIS — Z1231 Encounter for screening mammogram for malignant neoplasm of breast: Secondary | ICD-10-CM | POA: Diagnosis not present

## 2022-05-14 ENCOUNTER — Other Ambulatory Visit: Payer: Self-pay

## 2022-05-14 MED ORDER — LABETALOL HCL 200 MG PO TABS: ORAL_TABLET | ORAL | 0 refills | Status: DC

## 2022-06-02 DIAGNOSIS — Z170 Estrogen receptor positive status [ER+]: Secondary | ICD-10-CM | POA: Diagnosis not present

## 2022-06-02 DIAGNOSIS — C50112 Malignant neoplasm of central portion of left female breast: Secondary | ICD-10-CM | POA: Diagnosis not present

## 2022-06-04 IMAGING — MR MR ABDOMEN WO/W CM
10 of 19 series · 20 of 48 positions shown · IV contrast (G MH)
Comparison: Abdominal MRI 05/10/2019. CT the abdomen and pelvis
02/21/2020.

CLINICAL DATA: 85-year-old female with history of right renal mass.
Follow-up study.

EXAM:
MRI ABDOMEN WITHOUT AND WITH CONTRAST
TECHNIQUE: Multiplanar multisequence MR imaging of the abdomen was performed
both before and after the administration of intravenous contrast.
CONTRAST:  8mL GADAVIST GADOBUTROL 1 MMOL/ML IV SOLN

[Series 3: cor ssfse nav · coronal · 6.0mm · 0.78mm/px · 2 of 34 slices shown]
[im 1/34]
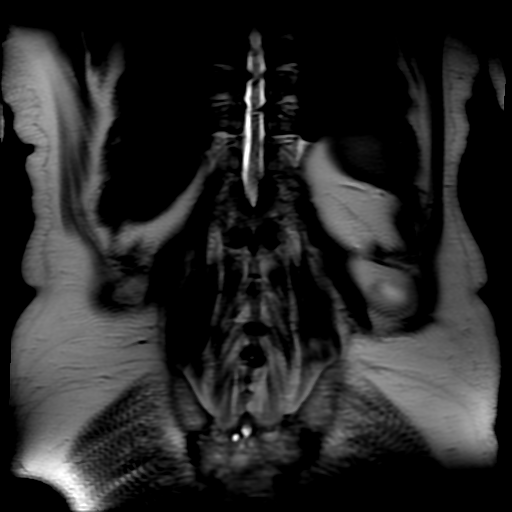
[im 34/34]
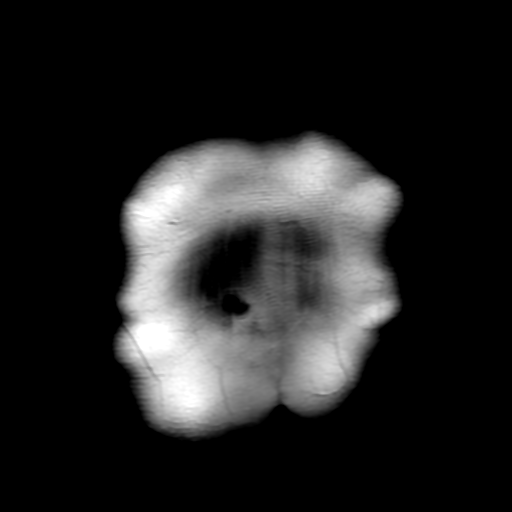

[Series 4: ax ssfse nav · axial · 6.0mm · 0.74mm/px · z∈[-47,+178]mm · 2 of 39 slices shown]
[im 1/39]
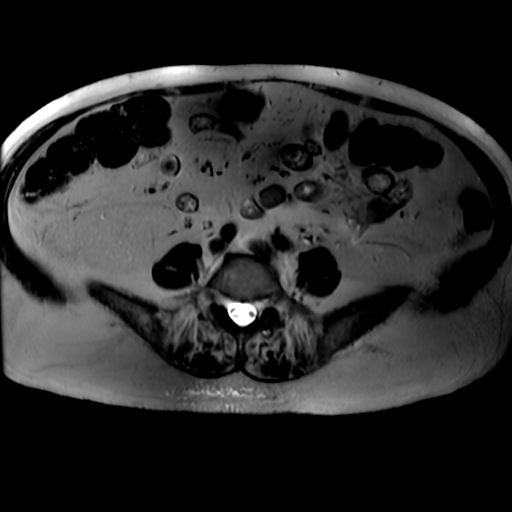
[im 39/39]
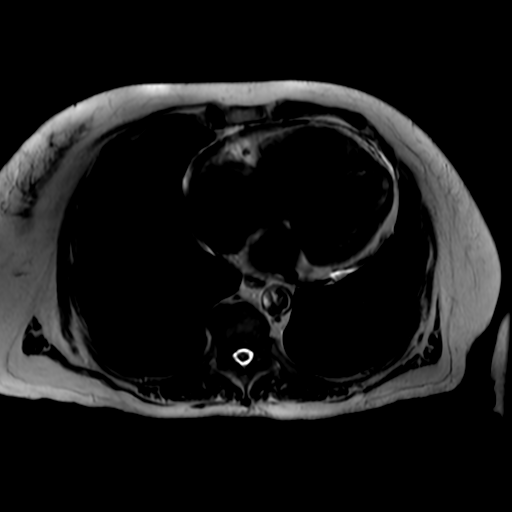

[Series 5: T2 fat-sat · axial · 6.0mm · 0.74mm/px · z∈[-47,+178]mm · 2 of 39 slices shown]
[im 1/39]
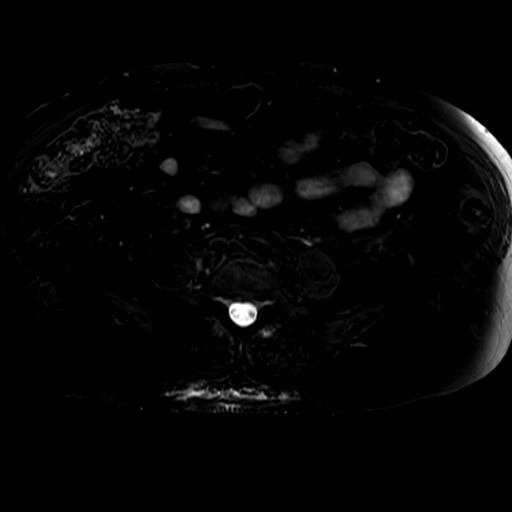
[im 39/39]
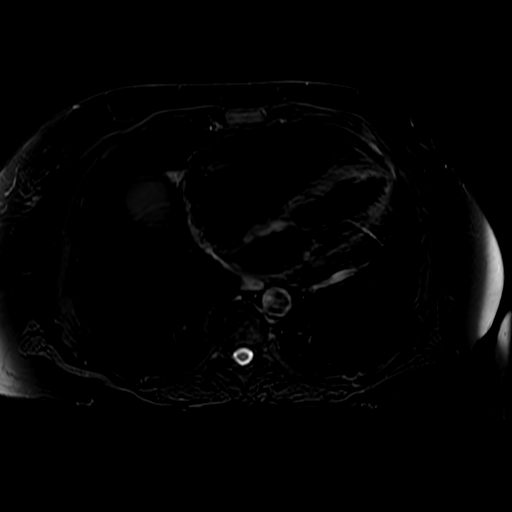

[Series 6: DWI b500 · axial · 8.0mm · 1.95mm/px · z∈[-92,+215]mm · 3 of 64 slices shown]
[im 1/64]
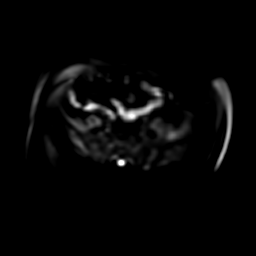
[im 32/64]
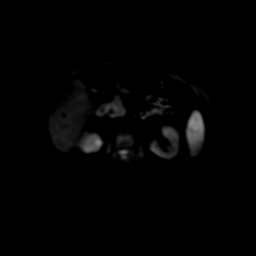
[im 64/64]
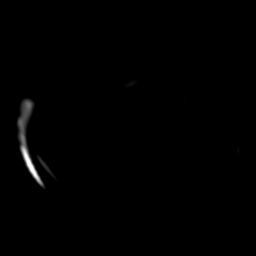

[Series 8: T1 dynamic · axial · 3.8mm · 0.82mm/px · z∈[-39,+182]mm · 2 of 60 slices shown (1 of 3)]
[im 1/60]
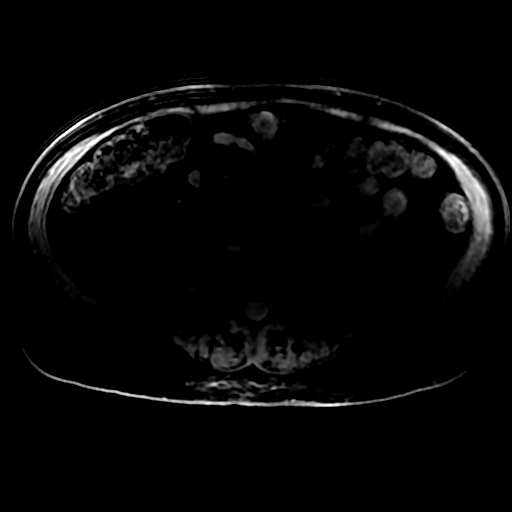
[im 60/60]
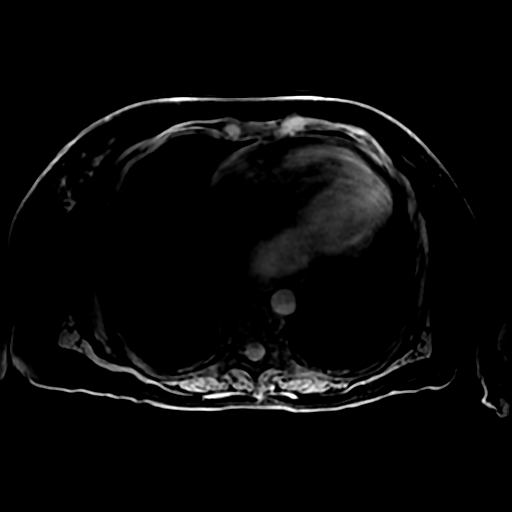

[Series 11: T1 dynamic post-contrast · coronal · 3.8mm · 0.86mm/px · 2 of 56 slices shown (1 of 2)]
[im 1/56]
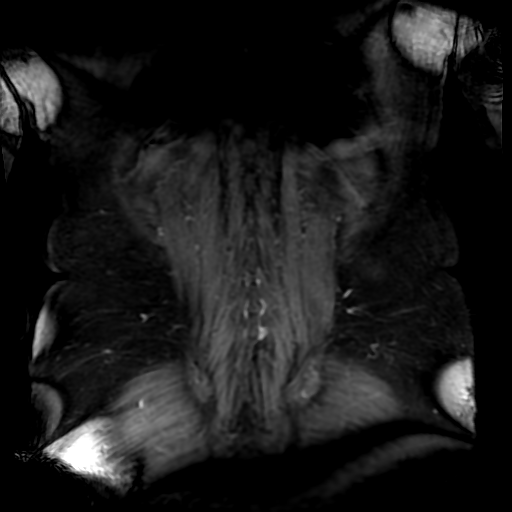
[im 56/56]
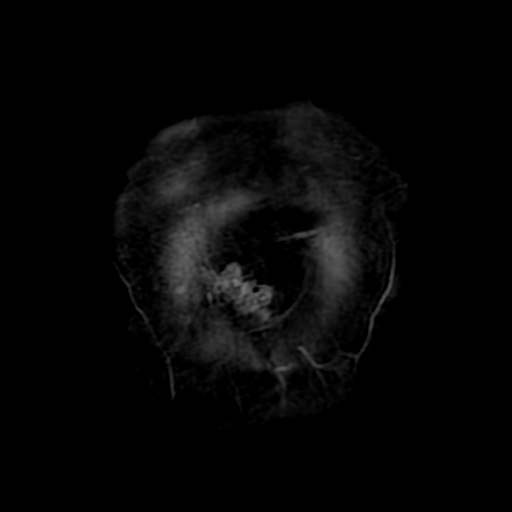

[Series 12: T1 dynamic post-contrast · axial · delayed · 4.0mm · 0.86mm/px · z∈[-44,+183]mm · 3 of 116 slices shown (2 of 2)]
[im 1/116]
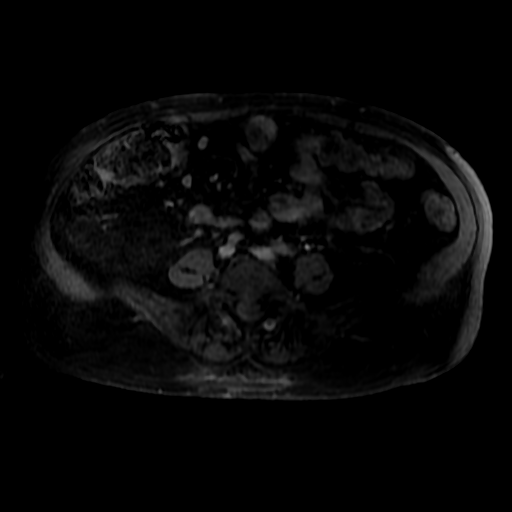
[im 58/116]
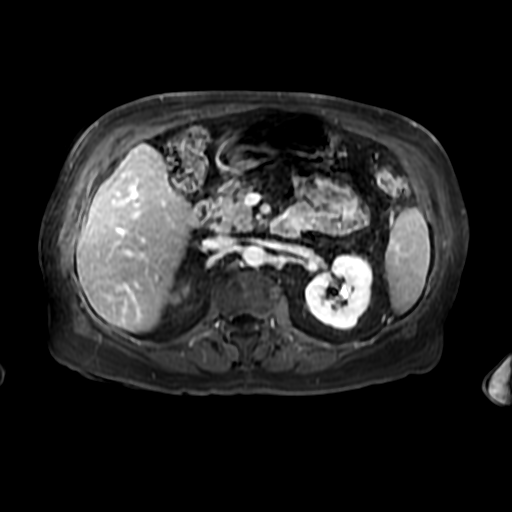
[im 116/116]
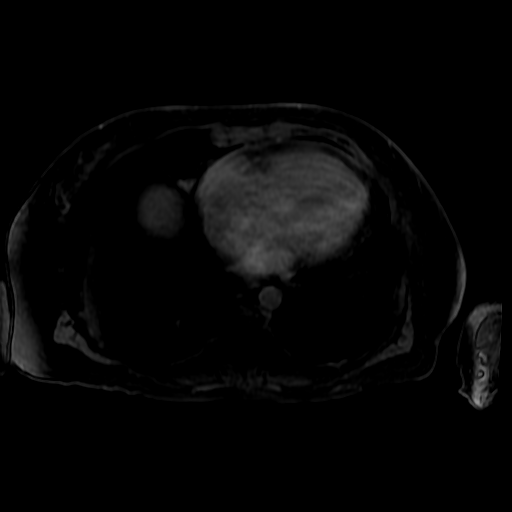

[Series 650: ADC · axial · 8.0mm · 1.95mm/px · 1 of 32 slices shown]
[im 1/32]
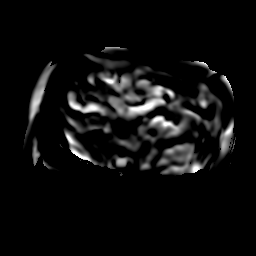

[Series 801: T1 dynamic · axial · 3.8mm · 0.82mm/px · z∈[-39,+182]mm · 2 of 60 slices shown (2 of 3)]
[im 1/60]
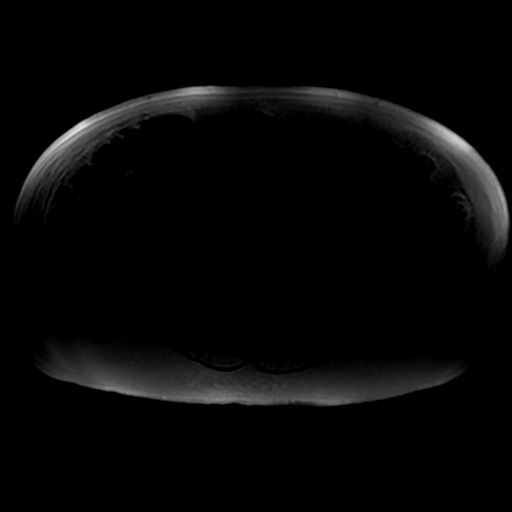
[im 60/60]
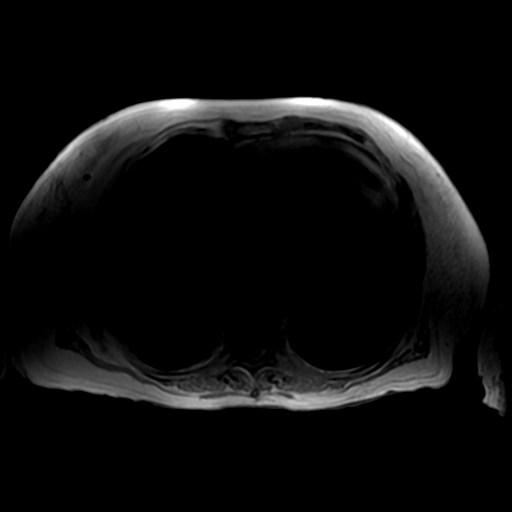

[Series 802: T1 dynamic · axial · 3.8mm · 0.82mm/px · 1 of 60 slices shown (3 of 3)]
[im 1/60]
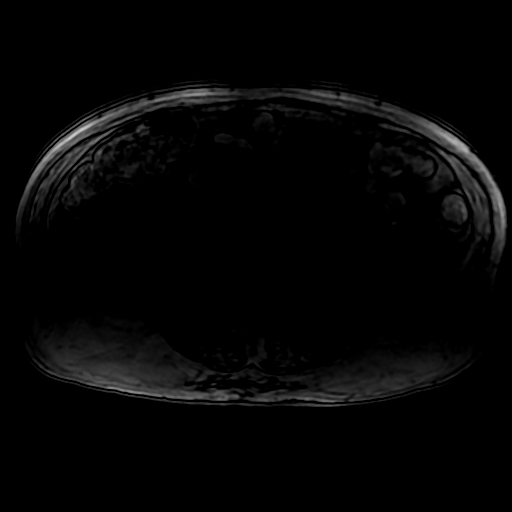

[20 of 48 positions shown; findings below may reference images not displayed]

FINDINGS: Lower chest: Mild cardiomegaly.

Hepatobiliary: Liver has a slightly shrunken appearance and nodular
contour, suggesting underlying cirrhosis. No suspicious cystic or
solid hepatic lesions. No intra or extrahepatic biliary ductal
dilatation. Status post cholecystectomy.

Pancreas: Multiple tiny T1 hypointense, T2 hyperintense,
nonenhancing lesions are noted throughout the pancreas, largest of
which is in the uncinate process measuring 8 x 5 mm (axial image 24
of series 4). No suspicious solid pancreatic lesions. No
peripancreatic fluid collections or inflammatory changes.

Spleen: Spleen is mildly enlarged measuring 13.4 x 5.9 x 13.7 cm
(estimated splenic volume of 542 mL) .

Adrenals/Urinary Tract: Multiple T1 hypointense, T2 hyperintense,
nonenhancing lesions are noted in both kidneys compatible with
simple cysts, largest of which is exophytic extending off the
posterior aspect of the lower pole of the left kidney measuring
cm. Subcentimeter T1 hyperintense lesion in the lower pole of the
right kidney is compatible with a tiny proteinaceous/hemorrhagic
cysts. No definite suspicious renal lesions. No
hydroureteronephrosis in the visualized portions of the abdomen.
Left adrenal gland is normal in appearance. 1.5 x 1.1 cm right
adrenal nodule, stable compared to numerous prior examinations
demonstrating loss of signal intensity on out of phase dual echo
images, indicative of an adrenal adenoma.

Stomach/Bowel: Unremarkable.

Vascular/Lymphatic: Aortic atherosclerosis, without evidence of
aneurysm in the abdominal vasculature. No lymphadenopathy noted in
the abdomen.

Other: No significant volume of ascites noted in the visualized
portions of the peritoneal cavity.

Musculoskeletal: No aggressive appearing osseous lesions are noted
in the visualized portions of the skeleton.
IMPRESSION: 1. No suspicious renal lesions. Multiple Bosniak class 1 and Bosniak
class 2 cysts in the kidneys bilaterally, as above.
2. Small right adrenal adenoma.
3. Morphologic changes in the liver indicative of mild cirrhosis.
4. Multiple tiny subcentimeter cystic lesions in the pancreas,
nonspecific, but statistically likely benign. Repeat abdominal MRI
with and without IV gadolinium with MRCP is recommended in 2 years
to ensure stability of these lesions. This recommendation follows
ACR consensus guidelines: Management of Incidental Pancreatic Cysts:
A White Paper of the ACR Incidental Findings Committee. [HOSPITAL] 1176;[DATE].
5. Aortic atherosclerosis.

## 2022-06-19 ENCOUNTER — Other Ambulatory Visit: Payer: Self-pay | Admitting: Cardiology

## 2022-06-20 ENCOUNTER — Other Ambulatory Visit: Payer: Self-pay | Admitting: Cardiology

## 2022-06-20 DIAGNOSIS — R3915 Urgency of urination: Secondary | ICD-10-CM | POA: Diagnosis not present

## 2022-06-20 DIAGNOSIS — N3020 Other chronic cystitis without hematuria: Secondary | ICD-10-CM | POA: Diagnosis not present

## 2022-06-20 DIAGNOSIS — R351 Nocturia: Secondary | ICD-10-CM | POA: Diagnosis not present

## 2022-06-26 ENCOUNTER — Telehealth: Payer: Self-pay | Admitting: Cardiology

## 2022-06-26 ENCOUNTER — Telehealth: Payer: Self-pay | Admitting: *Deleted

## 2022-06-26 ENCOUNTER — Other Ambulatory Visit: Payer: Self-pay

## 2022-06-26 MED ORDER — LABETALOL HCL 200 MG PO TABS: ORAL_TABLET | ORAL | 0 refills | Status: DC

## 2022-06-26 NOTE — Patient Outreach (Signed)
  Care Coordination   Initial Visit Note   06/26/2022 Name: Camerin Jimenez MRN: [id number] DOB: Sep 07, 1935  Geniyah Eischeid Calli Bashor is a 87 y.o. year old female who sees Potts, Georgeann Oppenheim, NP for primary care. I spoke with  [name] by phone today.  What matters to the patients health and wellness today?  Pt is interested in Care Coordination due to her extensive chronic disease and for better management.    Goals Addressed   None     SDOH assessments and interventions completed:  Yes  SDOH Interventions Today    Flowsheet Row Most Recent Value  SDOH Interventions   Food Insecurity Interventions Intervention Not Indicated  Housing Interventions Intervention Not Indicated  Transportation Interventions Intervention Not Indicated        Care Coordination Interventions Activated:  Yes  Care Coordination Interventions:  Yes, provided   Follow up plan: Follow up call scheduled for 07/02/22 with RNCM    Encounter Outcome:  Pt. Scheduled   Eduard Clos MSW, LCSW Licensed Clinical Social Worker      [phone]

## 2022-06-26 NOTE — Telephone Encounter (Signed)
Spoke with patient regarding refill of Labetalol. Informed patient that refill was sent to [facility] mail order back in August. Patient states that she had changed pharmacies and was supposed to be sent to [facility].  I instructed patient that I will send a refill to her current pharmacy. Patient also scheduled her 1 yr follow up with Dr Bettina Gavia for 10/06/22 and I have mailed patient an appointment reminder as well.   I asked patient if there was anything else I could help her with , she replied no and thanked me for helping her.

## 2022-06-26 NOTE — Telephone Encounter (Signed)
Pt c/o medication issue:  1. Name of Medication:  labetalol (NORMODYNE) 200 MG tablet  2. How are you currently taking this medication (dosage and times per day)? As prescribed   3. Are you having a reaction (difficulty breathing--STAT)? No   4. What is your medication issue? Pharmacy has requested a refill on this medication twice and it was denied both times. She is requesting a callback to told why. Patient's runs out of this medication Sunday. Please advise.

## 2022-06-26 NOTE — Telephone Encounter (Signed)
Refill sen to pharmacy

## 2022-07-02 ENCOUNTER — Ambulatory Visit: Payer: Self-pay

## 2022-07-02 NOTE — Patient Outreach (Signed)
  Care Coordination   Initial Visit Note   07/02/2022 Name: Lori Rodriguez MRN: [id number] DOB: 11-19-1934  Lori Rodriguez is a 87 y.o. year old female who sees Potts, Georgeann Oppenheim, NP for primary care. I spoke with  [name] by phone today.  What matters to the patients health and wellness today?  Placed call to patient for scheduled appointment.  Patient reports that her main concern is fall prevention, pain and no advanced directives.     Goals Addressed               This Visit's Progress     Avoid falls (pt-stated)        Care Coordination Interventions: Provided written and verbal education re: potential causes of falls and Fall prevention strategies Advised patient of importance of notifying provider of falls Assessed working status of life alert bracelet and patient adherence Assessed social determinant of health barriers Encouraged patent to write down on a calendar when she has a fall Reviewed interest in home exercise plan and patient is interested. Will mail [facility] home exercise plan No advanced directives. Will mail an advanced directive packet.        SDOH assessments and interventions completed:  Yes  SDOH Interventions Today    Flowsheet Row Most Recent Value  SDOH Interventions   Food Insecurity Interventions Intervention Not Indicated  Housing Interventions Intervention Not Indicated  Transportation Interventions Intervention Not Indicated  Utilities Interventions Intervention Not Indicated        Care Coordination Interventions Activated:  Yes  Care Coordination Interventions:  Yes, provided   Follow up plan: Follow up call scheduled for 08/11/2022    Encounter Outcome:  Pt. Visit Completed   Tomasa Rand, RN, BSN, CEN [place] Coordinator [phone]

## 2022-07-22 DIAGNOSIS — E538 Deficiency of other specified B group vitamins: Secondary | ICD-10-CM | POA: Diagnosis not present

## 2022-07-22 DIAGNOSIS — D509 Iron deficiency anemia, unspecified: Secondary | ICD-10-CM | POA: Diagnosis not present

## 2022-07-22 DIAGNOSIS — E785 Hyperlipidemia, unspecified: Secondary | ICD-10-CM | POA: Diagnosis not present

## 2022-07-22 DIAGNOSIS — I10 Essential (primary) hypertension: Secondary | ICD-10-CM | POA: Diagnosis not present

## 2022-07-22 DIAGNOSIS — Z79899 Other long term (current) drug therapy: Secondary | ICD-10-CM | POA: Diagnosis not present

## 2022-07-22 DIAGNOSIS — I4891 Unspecified atrial fibrillation: Secondary | ICD-10-CM | POA: Diagnosis not present

## 2022-07-22 DIAGNOSIS — J449 Chronic obstructive pulmonary disease, unspecified: Secondary | ICD-10-CM | POA: Diagnosis not present

## 2022-07-22 DIAGNOSIS — I50810 Right heart failure, unspecified: Secondary | ICD-10-CM | POA: Diagnosis not present

## 2022-07-22 DIAGNOSIS — E1169 Type 2 diabetes mellitus with other specified complication: Secondary | ICD-10-CM | POA: Diagnosis not present

## 2022-07-22 DIAGNOSIS — E559 Vitamin D deficiency, unspecified: Secondary | ICD-10-CM | POA: Diagnosis not present

## 2022-07-22 DIAGNOSIS — Z23 Encounter for immunization: Secondary | ICD-10-CM | POA: Diagnosis not present

## 2022-07-28 DIAGNOSIS — M25512 Pain in left shoulder: Secondary | ICD-10-CM | POA: Diagnosis not present

## 2022-07-28 DIAGNOSIS — M542 Cervicalgia: Secondary | ICD-10-CM | POA: Diagnosis not present

## 2022-07-28 DIAGNOSIS — M19012 Primary osteoarthritis, left shoulder: Secondary | ICD-10-CM | POA: Diagnosis not present

## 2022-08-11 ENCOUNTER — Ambulatory Visit: Payer: Self-pay

## 2022-08-11 NOTE — Patient Outreach (Signed)
  Care Coordination   Follow Up Visit Note   08/11/2022 Name: Lori Rodriguez MRN: [id number] DOB: 03-20-1935  Lori Rodriguez Lori Rodriguez is a 87 y.o. year old female who sees Potts, Lori Oppenheim, NP for primary care. I spoke with  Lori Rodriguez by phone today.  What matters to the patients health and wellness today?  Patient reports that she is doing well. No falls since our last telephone visit.  Reports she did get the exercise packet I mail and she is doing the exercises. Reports she is " less wobbly"   Reports she also got the advanced directive packet and is planning to get this completed.  Reports she thinks she pulled a muscle twisting to reach something at the grocery store.      Goals Addressed               This Visit's Progress     Avoid falls (pt-stated)        Care Coordination Interventions: Confirmed exercise packet was received.  Reviewed the packet with patient and she understands how to the exercises Encouraged patient to do her exercises daily. Assessed for falls- no falls since last telephone visit Assess for pain-  5/10 Confirmed that patient got the advanced directive packet that was mailed to her. Encouraged patient to complete the advanced directive packet.        SDOH assessments and interventions completed:  No     Care Coordination Interventions Activated:  Yes  Care Coordination Interventions:  Yes, provided   Follow up plan: Follow up call scheduled for 11/12/2022    Encounter Outcome:  Pt. Visit Completed   Lori Rand, RN, BSN, CEN [place] Coordinator [phone]

## 2022-08-28 ENCOUNTER — Telehealth: Payer: Self-pay | Admitting: Cardiology

## 2022-08-28 ENCOUNTER — Other Ambulatory Visit: Payer: Self-pay

## 2022-08-28 MED ORDER — RIVAROXABAN 20 MG PO TABS: 20.0000 mg | ORAL_TABLET | Freq: Every day | ORAL | 0 refills | Status: DC

## 2022-08-28 NOTE — Telephone Encounter (Signed)
*  STAT* If patient is at the pharmacy, call can be transferred to refill team.   1. Which medications need to be refilled? (please list name of each medication and dose if known) rivaroxaban (XARELTO) 20 MG TABS tablet   2. Which pharmacy/location (including street and city if local pharmacy) is medication to be sent to?  [place], [place]    3. Do they need a 30 day or 90 day supply? 7  Pt has ran out of this medication and her mail order just shipped today. She is requesting 7DAY SUPPLY until her Roxana Hires is received.

## 2022-08-28 NOTE — Telephone Encounter (Signed)
Called patient and informed her that a prescription for 14 tablets of Xarelto was sent to her pharmacy to cover her until the mail order Xarelto prescription arrives at her house. Patient was appreciative for the call and had no further questions at this time.

## 2022-09-01 DIAGNOSIS — M542 Cervicalgia: Secondary | ICD-10-CM | POA: Diagnosis not present

## 2022-09-01 DIAGNOSIS — M25512 Pain in left shoulder: Secondary | ICD-10-CM | POA: Diagnosis not present

## 2022-09-01 DIAGNOSIS — M25511 Pain in right shoulder: Secondary | ICD-10-CM | POA: Diagnosis not present

## 2022-09-17 DIAGNOSIS — E663 Overweight: Secondary | ICD-10-CM | POA: Diagnosis not present

## 2022-09-17 DIAGNOSIS — Z6829 Body mass index (BMI) 29.0-29.9, adult: Secondary | ICD-10-CM | POA: Diagnosis not present

## 2022-09-17 DIAGNOSIS — R030 Elevated blood-pressure reading, without diagnosis of hypertension: Secondary | ICD-10-CM | POA: Diagnosis not present

## 2022-09-17 DIAGNOSIS — S5012XA Contusion of left forearm, initial encounter: Secondary | ICD-10-CM | POA: Diagnosis not present

## 2022-09-19 DIAGNOSIS — M25412 Effusion, left shoulder: Secondary | ICD-10-CM | POA: Diagnosis not present

## 2022-09-19 DIAGNOSIS — M19012 Primary osteoarthritis, left shoulder: Secondary | ICD-10-CM | POA: Diagnosis not present

## 2022-09-19 DIAGNOSIS — M25512 Pain in left shoulder: Secondary | ICD-10-CM | POA: Diagnosis not present

## 2022-09-19 DIAGNOSIS — S46812A Strain of other muscles, fascia and tendons at shoulder and upper arm level, left arm, initial encounter: Secondary | ICD-10-CM | POA: Diagnosis not present

## 2022-09-19 DIAGNOSIS — M7582 Other shoulder lesions, left shoulder: Secondary | ICD-10-CM | POA: Diagnosis not present

## 2022-09-21 DIAGNOSIS — E119 Type 2 diabetes mellitus without complications: Secondary | ICD-10-CM | POA: Diagnosis not present

## 2022-09-21 DIAGNOSIS — G2581 Restless legs syndrome: Secondary | ICD-10-CM | POA: Diagnosis not present

## 2022-09-21 DIAGNOSIS — I509 Heart failure, unspecified: Secondary | ICD-10-CM | POA: Diagnosis not present

## 2022-09-21 DIAGNOSIS — Z743 Need for continuous supervision: Secondary | ICD-10-CM | POA: Diagnosis not present

## 2022-09-21 DIAGNOSIS — I2721 Secondary pulmonary arterial hypertension: Secondary | ICD-10-CM | POA: Diagnosis not present

## 2022-09-21 DIAGNOSIS — I5032 Chronic diastolic (congestive) heart failure: Secondary | ICD-10-CM | POA: Diagnosis not present

## 2022-09-21 DIAGNOSIS — R609 Edema, unspecified: Secondary | ICD-10-CM | POA: Diagnosis not present

## 2022-09-21 DIAGNOSIS — S5012XA Contusion of left forearm, initial encounter: Secondary | ICD-10-CM | POA: Diagnosis not present

## 2022-09-21 DIAGNOSIS — D649 Anemia, unspecified: Secondary | ICD-10-CM | POA: Diagnosis not present

## 2022-09-21 DIAGNOSIS — I10 Essential (primary) hypertension: Secondary | ICD-10-CM | POA: Diagnosis not present

## 2022-09-21 DIAGNOSIS — E782 Mixed hyperlipidemia: Secondary | ICD-10-CM | POA: Diagnosis not present

## 2022-09-21 DIAGNOSIS — N189 Chronic kidney disease, unspecified: Secondary | ICD-10-CM | POA: Diagnosis not present

## 2022-09-21 DIAGNOSIS — I110 Hypertensive heart disease with heart failure: Secondary | ICD-10-CM | POA: Diagnosis not present

## 2022-09-21 DIAGNOSIS — F32A Depression, unspecified: Secondary | ICD-10-CM | POA: Diagnosis not present

## 2022-09-21 DIAGNOSIS — I4819 Other persistent atrial fibrillation: Secondary | ICD-10-CM | POA: Diagnosis not present

## 2022-09-22 DIAGNOSIS — I5032 Chronic diastolic (congestive) heart failure: Secondary | ICD-10-CM | POA: Diagnosis not present

## 2022-09-22 DIAGNOSIS — I4819 Other persistent atrial fibrillation: Secondary | ICD-10-CM | POA: Diagnosis not present

## 2022-09-22 DIAGNOSIS — G2581 Restless legs syndrome: Secondary | ICD-10-CM | POA: Diagnosis not present

## 2022-09-22 DIAGNOSIS — I4891 Unspecified atrial fibrillation: Secondary | ICD-10-CM | POA: Diagnosis not present

## 2022-09-22 DIAGNOSIS — I110 Hypertensive heart disease with heart failure: Secondary | ICD-10-CM | POA: Diagnosis not present

## 2022-09-22 DIAGNOSIS — E119 Type 2 diabetes mellitus without complications: Secondary | ICD-10-CM | POA: Diagnosis not present

## 2022-09-22 DIAGNOSIS — I2721 Secondary pulmonary arterial hypertension: Secondary | ICD-10-CM | POA: Diagnosis not present

## 2022-09-22 DIAGNOSIS — S5012XA Contusion of left forearm, initial encounter: Secondary | ICD-10-CM | POA: Diagnosis not present

## 2022-09-22 DIAGNOSIS — R9431 Abnormal electrocardiogram [ECG] [EKG]: Secondary | ICD-10-CM | POA: Diagnosis not present

## 2022-09-22 DIAGNOSIS — E782 Mixed hyperlipidemia: Secondary | ICD-10-CM | POA: Diagnosis not present

## 2022-09-22 DIAGNOSIS — F32A Depression, unspecified: Secondary | ICD-10-CM | POA: Diagnosis not present

## 2022-09-23 DIAGNOSIS — S5012XA Contusion of left forearm, initial encounter: Secondary | ICD-10-CM | POA: Diagnosis not present

## 2022-09-23 DIAGNOSIS — I5032 Chronic diastolic (congestive) heart failure: Secondary | ICD-10-CM | POA: Diagnosis not present

## 2022-09-23 DIAGNOSIS — D649 Anemia, unspecified: Secondary | ICD-10-CM | POA: Diagnosis not present

## 2022-09-23 DIAGNOSIS — E119 Type 2 diabetes mellitus without complications: Secondary | ICD-10-CM | POA: Diagnosis not present

## 2022-09-23 DIAGNOSIS — E782 Mixed hyperlipidemia: Secondary | ICD-10-CM | POA: Diagnosis not present

## 2022-09-23 DIAGNOSIS — F32A Depression, unspecified: Secondary | ICD-10-CM | POA: Diagnosis not present

## 2022-09-23 DIAGNOSIS — G2581 Restless legs syndrome: Secondary | ICD-10-CM | POA: Diagnosis not present

## 2022-09-23 DIAGNOSIS — E6609 Other obesity due to excess calories: Secondary | ICD-10-CM | POA: Diagnosis not present

## 2022-09-23 DIAGNOSIS — I110 Hypertensive heart disease with heart failure: Secondary | ICD-10-CM | POA: Diagnosis not present

## 2022-09-23 DIAGNOSIS — Z6830 Body mass index (BMI) 30.0-30.9, adult: Secondary | ICD-10-CM | POA: Diagnosis not present

## 2022-09-23 DIAGNOSIS — I4819 Other persistent atrial fibrillation: Secondary | ICD-10-CM | POA: Diagnosis not present

## 2022-09-23 DIAGNOSIS — I2721 Secondary pulmonary arterial hypertension: Secondary | ICD-10-CM | POA: Diagnosis not present

## 2022-09-24 DIAGNOSIS — E782 Mixed hyperlipidemia: Secondary | ICD-10-CM | POA: Diagnosis not present

## 2022-09-24 DIAGNOSIS — E119 Type 2 diabetes mellitus without complications: Secondary | ICD-10-CM | POA: Diagnosis not present

## 2022-09-24 DIAGNOSIS — I110 Hypertensive heart disease with heart failure: Secondary | ICD-10-CM | POA: Diagnosis not present

## 2022-09-24 DIAGNOSIS — G2581 Restless legs syndrome: Secondary | ICD-10-CM | POA: Diagnosis not present

## 2022-09-24 DIAGNOSIS — F32A Depression, unspecified: Secondary | ICD-10-CM | POA: Diagnosis not present

## 2022-09-24 DIAGNOSIS — S5012XA Contusion of left forearm, initial encounter: Secondary | ICD-10-CM | POA: Diagnosis not present

## 2022-09-24 DIAGNOSIS — I4819 Other persistent atrial fibrillation: Secondary | ICD-10-CM | POA: Diagnosis not present

## 2022-09-24 DIAGNOSIS — I2721 Secondary pulmonary arterial hypertension: Secondary | ICD-10-CM | POA: Diagnosis not present

## 2022-09-24 DIAGNOSIS — I5032 Chronic diastolic (congestive) heart failure: Secondary | ICD-10-CM | POA: Diagnosis not present

## 2022-09-25 DIAGNOSIS — G2581 Restless legs syndrome: Secondary | ICD-10-CM | POA: Diagnosis not present

## 2022-09-25 DIAGNOSIS — S5012XA Contusion of left forearm, initial encounter: Secondary | ICD-10-CM | POA: Diagnosis not present

## 2022-09-25 DIAGNOSIS — I4819 Other persistent atrial fibrillation: Secondary | ICD-10-CM | POA: Diagnosis not present

## 2022-09-25 DIAGNOSIS — E119 Type 2 diabetes mellitus without complications: Secondary | ICD-10-CM | POA: Diagnosis not present

## 2022-09-25 DIAGNOSIS — F32A Depression, unspecified: Secondary | ICD-10-CM | POA: Diagnosis not present

## 2022-09-25 DIAGNOSIS — I110 Hypertensive heart disease with heart failure: Secondary | ICD-10-CM | POA: Diagnosis not present

## 2022-09-25 DIAGNOSIS — I2721 Secondary pulmonary arterial hypertension: Secondary | ICD-10-CM | POA: Diagnosis not present

## 2022-09-25 DIAGNOSIS — E782 Mixed hyperlipidemia: Secondary | ICD-10-CM | POA: Diagnosis not present

## 2022-09-25 DIAGNOSIS — I5032 Chronic diastolic (congestive) heart failure: Secondary | ICD-10-CM | POA: Diagnosis not present

## 2022-09-28 DIAGNOSIS — Z48817 Encounter for surgical aftercare following surgery on the skin and subcutaneous tissue: Secondary | ICD-10-CM | POA: Diagnosis not present

## 2022-09-28 DIAGNOSIS — S5012XD Contusion of left forearm, subsequent encounter: Secondary | ICD-10-CM | POA: Diagnosis not present

## 2022-09-28 DIAGNOSIS — N189 Chronic kidney disease, unspecified: Secondary | ICD-10-CM | POA: Diagnosis not present

## 2022-09-28 DIAGNOSIS — I4819 Other persistent atrial fibrillation: Secondary | ICD-10-CM | POA: Diagnosis not present

## 2022-09-28 DIAGNOSIS — W19XXXD Unspecified fall, subsequent encounter: Secondary | ICD-10-CM | POA: Diagnosis not present

## 2022-09-28 DIAGNOSIS — Z7901 Long term (current) use of anticoagulants: Secondary | ICD-10-CM | POA: Diagnosis not present

## 2022-09-28 DIAGNOSIS — I110 Hypertensive heart disease with heart failure: Secondary | ICD-10-CM | POA: Diagnosis not present

## 2022-09-28 DIAGNOSIS — I5032 Chronic diastolic (congestive) heart failure: Secondary | ICD-10-CM | POA: Diagnosis not present

## 2022-09-28 DIAGNOSIS — D649 Anemia, unspecified: Secondary | ICD-10-CM | POA: Diagnosis not present

## 2022-09-30 DIAGNOSIS — S5012XD Contusion of left forearm, subsequent encounter: Secondary | ICD-10-CM | POA: Diagnosis not present

## 2022-09-30 DIAGNOSIS — I4819 Other persistent atrial fibrillation: Secondary | ICD-10-CM | POA: Diagnosis not present

## 2022-09-30 DIAGNOSIS — W19XXXD Unspecified fall, subsequent encounter: Secondary | ICD-10-CM | POA: Diagnosis not present

## 2022-09-30 DIAGNOSIS — N189 Chronic kidney disease, unspecified: Secondary | ICD-10-CM | POA: Diagnosis not present

## 2022-09-30 DIAGNOSIS — Z48817 Encounter for surgical aftercare following surgery on the skin and subcutaneous tissue: Secondary | ICD-10-CM | POA: Diagnosis not present

## 2022-09-30 DIAGNOSIS — I5032 Chronic diastolic (congestive) heart failure: Secondary | ICD-10-CM | POA: Diagnosis not present

## 2022-09-30 DIAGNOSIS — I110 Hypertensive heart disease with heart failure: Secondary | ICD-10-CM | POA: Diagnosis not present

## 2022-09-30 DIAGNOSIS — Z7901 Long term (current) use of anticoagulants: Secondary | ICD-10-CM | POA: Diagnosis not present

## 2022-09-30 DIAGNOSIS — D649 Anemia, unspecified: Secondary | ICD-10-CM | POA: Diagnosis not present

## 2022-10-01 DIAGNOSIS — S5012XD Contusion of left forearm, subsequent encounter: Secondary | ICD-10-CM | POA: Diagnosis not present

## 2022-10-01 DIAGNOSIS — D649 Anemia, unspecified: Secondary | ICD-10-CM | POA: Diagnosis not present

## 2022-10-01 DIAGNOSIS — I110 Hypertensive heart disease with heart failure: Secondary | ICD-10-CM | POA: Diagnosis not present

## 2022-10-01 DIAGNOSIS — N189 Chronic kidney disease, unspecified: Secondary | ICD-10-CM | POA: Diagnosis not present

## 2022-10-01 DIAGNOSIS — I5032 Chronic diastolic (congestive) heart failure: Secondary | ICD-10-CM | POA: Diagnosis not present

## 2022-10-01 DIAGNOSIS — W19XXXD Unspecified fall, subsequent encounter: Secondary | ICD-10-CM | POA: Diagnosis not present

## 2022-10-01 DIAGNOSIS — I4819 Other persistent atrial fibrillation: Secondary | ICD-10-CM | POA: Diagnosis not present

## 2022-10-01 DIAGNOSIS — C50112 Malignant neoplasm of central portion of left female breast: Secondary | ICD-10-CM | POA: Insufficient documentation

## 2022-10-01 DIAGNOSIS — Z7901 Long term (current) use of anticoagulants: Secondary | ICD-10-CM | POA: Diagnosis not present

## 2022-10-01 DIAGNOSIS — Z48817 Encounter for surgical aftercare following surgery on the skin and subcutaneous tissue: Secondary | ICD-10-CM | POA: Diagnosis not present

## 2022-10-02 DIAGNOSIS — S5012XD Contusion of left forearm, subsequent encounter: Secondary | ICD-10-CM | POA: Diagnosis not present

## 2022-10-02 DIAGNOSIS — N189 Chronic kidney disease, unspecified: Secondary | ICD-10-CM | POA: Diagnosis not present

## 2022-10-02 DIAGNOSIS — Z7901 Long term (current) use of anticoagulants: Secondary | ICD-10-CM | POA: Diagnosis not present

## 2022-10-02 DIAGNOSIS — W19XXXD Unspecified fall, subsequent encounter: Secondary | ICD-10-CM | POA: Diagnosis not present

## 2022-10-02 DIAGNOSIS — I5032 Chronic diastolic (congestive) heart failure: Secondary | ICD-10-CM | POA: Diagnosis not present

## 2022-10-02 DIAGNOSIS — Z48817 Encounter for surgical aftercare following surgery on the skin and subcutaneous tissue: Secondary | ICD-10-CM | POA: Diagnosis not present

## 2022-10-02 DIAGNOSIS — D649 Anemia, unspecified: Secondary | ICD-10-CM | POA: Diagnosis not present

## 2022-10-02 DIAGNOSIS — I4819 Other persistent atrial fibrillation: Secondary | ICD-10-CM | POA: Diagnosis not present

## 2022-10-02 DIAGNOSIS — I110 Hypertensive heart disease with heart failure: Secondary | ICD-10-CM | POA: Diagnosis not present

## 2022-10-04 NOTE — Progress Notes (Deleted)
Cardiology Office Note:    Date:  10/06/2022   ID:  Lori Rodriguez, DOB Sep 02, 1935, MRN [id number]  PCP:  Earlyne Iba, NP  Cardiologist:  Shirlee More, MD    Referring MD: Earlyne Iba, NP    ASSESSMENT:    1. Permanent atrial fibrillation ([place])   2. Long term (current) use of anticoagulants   3. Hypertensive heart disease with chronic diastolic congestive heart failure ([place])   4. Orthostatic hypotension    PLAN:    In order of problems listed above:  ***   Next appointment: ***   Medication Adjustments/Labs and Tests Ordered: Current medicines are reviewed at length with the patient today.  Concerns regarding medicines are outlined above.  No orders of the defined types were placed in this encounter.  No orders of the defined types were placed in this encounter.   No chief complaint on file.   History of Present Illness:    Lori Rodriguez is a 88 y.o. female with a hx of chronic atrial fibrillation with anticoagulation hypertensive heart disease with heart failure anemia and orthostatic hypotension last seen 10/09/2021.  She was admitted to [facility] in [place] 09/21/2022 discharged 09/25/2021 with trauma hematoma of the forearm.  She had a fall 2012 26 evaluation did not show fracture but she had severe hematoma of the dorsal aspect of the forearm.  She required surgical intervention for drainage of the hematoma.  Laboratory studies showed sodium 137 potassium 4.4 creatinine 0.86.  EKG showed atrial fibrillation rate 70s 75 bpm right ventricular conduction delay and low voltage.  Hemoglobin at discharge 11.0 Compliance with diet, lifestyle and medications: *** Past Medical History:  Diagnosis Date   Anemia    Anginal pain (HCC)    Atrial fibrillation (HCC)    Cancer (HCC)    Left Breast Cancer   Cancer of central portion of left female breast ([place]) 03/10/2016   CHF (congestive heart failure) (HCC)    Chronic back pain     Chronic kidney disease    Right Renal Mass   Diabetes mellitus without complication (HCC)    DJD (degenerative joint disease)    Dysrhythmia    Atrial fibrillation   Encounter for preprocedural cardiovascular examination 02/28/2016   Formatting of this note might be different from the original. Overview:  Remote normal coronary arteriography and echo 2015 with normal EF% Formatting of this note might be different from the original. Remote normal coronary arteriography and echo 2015 with normal EF%   Estrogen receptor positive neoplasm 03/10/2016   Hypertension    Hypertensive heart disease 02/28/2016   Long term (current) use of anticoagulants 02/29/2016   Overview:  Overview:  apixaban Overview:  apixaban  Formatting of this note might be different from the original. Overview:  apixaban Formatting of this note might be different from the original. apixaban   Mixed hyperlipidemia 02/29/2016   Orthostatic hypotension 12/01/2018   Osteoporosis    Persistent atrial fibrillation ([place]) 02/28/2016   Overview:  Overview:  CHADS2 vasc score= 4  Overview:  CHADS2 vasc score= 4 Overview:  CHADS2 vasc score= 4  Formatting of this note might be different from the original. Overview:  CHADS2 vasc score= 4  Overview:  CHADS2 vasc score= 4 Formatting of this note might be different from the original. CHADS2 vasc score= 4   Right renal mass    Secondary pulmonary arterial hypertension ([place]) 11/03/2018   Type 2 diabetes mellitus ([place]) 02/29/2016  Vitamin B12 deficiency    Vitamin D deficiency     Past Surgical History:  Procedure Laterality Date   BREAST LUMPECTOMY Left    CARDIAC CATHETERIZATION     CATARACT EXTRACTION, BILATERAL     CHOLECYSTECTOMY     IR RADIOLOGIST EVAL & MGMT  09/29/2018   IR RADIOLOGIST EVAL & MGMT  05/12/2019   IR RADIOLOGIST EVAL & MGMT  05/22/2020   TOTAL HIP ARTHROPLASTY Bilateral    TOTAL HIP REVISION Right 01/24/2021   Procedure: TOTAL HIP REVISION;  Surgeon: Rod Can, MD;   Location: [facility];  Service: Orthopedics;  Laterality: Right;    Current Medications: No outpatient medications have been marked as taking for the 10/06/22 encounter (Appointment) with Richardo Priest, MD.     Allergies:   Lisinopril, Percocet [oxycodone-acetaminophen], Valium [diazepam], and Prednisone   Social History   Socioeconomic History   Marital status: Widowed    Spouse name: Not on file   Number of children: Not on file   Years of education: Not on file   Highest education level: Not on file  Occupational History   Not on file  Tobacco Use   Smoking status: Never    Passive exposure: Never   Smokeless tobacco: Never  Vaping Use   Vaping Use: Never used  Substance and Sexual Activity   Alcohol use: Never   Drug use: Never   Sexual activity: Not on file  Other Topics Concern   Not on file  Social History Narrative   Not on file   Social Determinants of Health   Financial Resource Strain: Not on file  Food Insecurity: No Food Insecurity (07/02/2022)   Hunger Vital Sign    Worried About Running Out of Food in the Last Year: Never true    Ran Out of Food in the Last Year: Never true  Transportation Needs: No Transportation Needs (07/02/2022)   PRAPARE - Hydrologist (Medical): No    Lack of Transportation (Non-Medical): No  Physical Activity: Not on file  Stress: Not on file  Social Connections: Not on file     Family History: The patient's ***family history includes Diabetes in her brother; Heart attack in her brother and father; Myasthenia gravis in her brother. ROS:   Please see the history of present illness.    All other systems reviewed and are negative.  EKGs/Labs/Other Studies Reviewed:    The following studies were reviewed today:  EKG:  EKG ordered today and personally reviewed.  The ekg ordered today demonstrates ***  Recent Labs: No results found for requested labs within last 365 days.  Recent Lipid Panel No  results found for: "CHOL", "TRIG", "HDL", "CHOLHDL", "VLDL", "LDLCALC", "LDLDIRECT"  Physical Exam:    VS:  There were no vitals taken for this visit.    Wt Readings from Last 3 Encounters:  12/06/21 176 lb (79.8 kg)  11/22/21 173 lb 11.2 oz (78.8 kg)  10/09/21 173 lb 3.2 oz (78.6 kg)     GEN: *** Well nourished, well developed in no acute distress HEENT: Normal NECK: No JVD; No carotid bruits LYMPHATICS: No lymphadenopathy CARDIAC: ***RRR, no murmurs, rubs, gallops RESPIRATORY:  Clear to auscultation without rales, wheezing or rhonchi  ABDOMEN: Soft, non-tender, non-distended MUSCULOSKELETAL:  No edema; No deformity  SKIN: Warm and dry NEUROLOGIC:  Alert and oriented x 3 PSYCHIATRIC:  Normal affect    Signed, Shirlee More, MD  10/06/2022 7:33 AM    [facility]

## 2022-10-06 ENCOUNTER — Ambulatory Visit: Payer: Medicare PPO | Attending: Cardiology | Admitting: Cardiology

## 2022-10-06 DIAGNOSIS — D649 Anemia, unspecified: Secondary | ICD-10-CM | POA: Diagnosis not present

## 2022-10-06 DIAGNOSIS — W19XXXD Unspecified fall, subsequent encounter: Secondary | ICD-10-CM | POA: Diagnosis not present

## 2022-10-06 DIAGNOSIS — I110 Hypertensive heart disease with heart failure: Secondary | ICD-10-CM | POA: Diagnosis not present

## 2022-10-06 DIAGNOSIS — S5012XD Contusion of left forearm, subsequent encounter: Secondary | ICD-10-CM | POA: Diagnosis not present

## 2022-10-06 DIAGNOSIS — N189 Chronic kidney disease, unspecified: Secondary | ICD-10-CM | POA: Diagnosis not present

## 2022-10-06 DIAGNOSIS — Z48817 Encounter for surgical aftercare following surgery on the skin and subcutaneous tissue: Secondary | ICD-10-CM | POA: Diagnosis not present

## 2022-10-06 DIAGNOSIS — I5032 Chronic diastolic (congestive) heart failure: Secondary | ICD-10-CM | POA: Diagnosis not present

## 2022-10-06 DIAGNOSIS — I4819 Other persistent atrial fibrillation: Secondary | ICD-10-CM | POA: Diagnosis not present

## 2022-10-06 DIAGNOSIS — Z7901 Long term (current) use of anticoagulants: Secondary | ICD-10-CM | POA: Diagnosis not present

## 2022-10-09 ENCOUNTER — Encounter: Payer: Self-pay | Admitting: Cardiology

## 2022-10-13 DIAGNOSIS — N189 Chronic kidney disease, unspecified: Secondary | ICD-10-CM | POA: Diagnosis not present

## 2022-10-13 DIAGNOSIS — Z48817 Encounter for surgical aftercare following surgery on the skin and subcutaneous tissue: Secondary | ICD-10-CM | POA: Diagnosis not present

## 2022-10-13 DIAGNOSIS — W19XXXD Unspecified fall, subsequent encounter: Secondary | ICD-10-CM | POA: Diagnosis not present

## 2022-10-13 DIAGNOSIS — I4819 Other persistent atrial fibrillation: Secondary | ICD-10-CM | POA: Diagnosis not present

## 2022-10-13 DIAGNOSIS — D649 Anemia, unspecified: Secondary | ICD-10-CM | POA: Diagnosis not present

## 2022-10-13 DIAGNOSIS — S5012XD Contusion of left forearm, subsequent encounter: Secondary | ICD-10-CM | POA: Diagnosis not present

## 2022-10-13 DIAGNOSIS — I110 Hypertensive heart disease with heart failure: Secondary | ICD-10-CM | POA: Diagnosis not present

## 2022-10-13 DIAGNOSIS — I5032 Chronic diastolic (congestive) heart failure: Secondary | ICD-10-CM | POA: Diagnosis not present

## 2022-10-13 DIAGNOSIS — Z7901 Long term (current) use of anticoagulants: Secondary | ICD-10-CM | POA: Diagnosis not present

## 2022-10-20 DIAGNOSIS — N189 Chronic kidney disease, unspecified: Secondary | ICD-10-CM | POA: Diagnosis not present

## 2022-10-20 DIAGNOSIS — I5032 Chronic diastolic (congestive) heart failure: Secondary | ICD-10-CM | POA: Diagnosis not present

## 2022-10-20 DIAGNOSIS — S5012XD Contusion of left forearm, subsequent encounter: Secondary | ICD-10-CM | POA: Diagnosis not present

## 2022-10-20 DIAGNOSIS — I110 Hypertensive heart disease with heart failure: Secondary | ICD-10-CM | POA: Diagnosis not present

## 2022-10-20 DIAGNOSIS — D649 Anemia, unspecified: Secondary | ICD-10-CM | POA: Diagnosis not present

## 2022-10-20 DIAGNOSIS — Z7901 Long term (current) use of anticoagulants: Secondary | ICD-10-CM | POA: Diagnosis not present

## 2022-10-20 DIAGNOSIS — W19XXXD Unspecified fall, subsequent encounter: Secondary | ICD-10-CM | POA: Diagnosis not present

## 2022-10-20 DIAGNOSIS — Z48817 Encounter for surgical aftercare following surgery on the skin and subcutaneous tissue: Secondary | ICD-10-CM | POA: Diagnosis not present

## 2022-10-20 DIAGNOSIS — I4819 Other persistent atrial fibrillation: Secondary | ICD-10-CM | POA: Diagnosis not present

## 2022-10-23 ENCOUNTER — Other Ambulatory Visit: Payer: Self-pay | Admitting: Cardiology

## 2022-10-23 NOTE — Telephone Encounter (Signed)
Prescription refill request for Xarelto received.  Indication: AF Last office visit: 10/09/21   Rinaldo Cloud MD  (Appt 12/04/22) Weight: 78.6kg Age: 88 Scr:  0.75 CrCl:  68.17   Based on above findings Xarelto '20mg'$  daily is the appropriate dose.  Refill approved.

## 2022-10-27 DIAGNOSIS — I4891 Unspecified atrial fibrillation: Secondary | ICD-10-CM | POA: Diagnosis not present

## 2022-10-27 DIAGNOSIS — I50810 Right heart failure, unspecified: Secondary | ICD-10-CM | POA: Diagnosis not present

## 2022-10-27 DIAGNOSIS — E559 Vitamin D deficiency, unspecified: Secondary | ICD-10-CM | POA: Diagnosis not present

## 2022-10-27 DIAGNOSIS — F320 Major depressive disorder, single episode, mild: Secondary | ICD-10-CM | POA: Diagnosis not present

## 2022-10-27 DIAGNOSIS — M1990 Unspecified osteoarthritis, unspecified site: Secondary | ICD-10-CM | POA: Diagnosis not present

## 2022-10-27 DIAGNOSIS — N390 Urinary tract infection, site not specified: Secondary | ICD-10-CM | POA: Diagnosis not present

## 2022-10-27 DIAGNOSIS — D509 Iron deficiency anemia, unspecified: Secondary | ICD-10-CM | POA: Diagnosis not present

## 2022-10-27 DIAGNOSIS — E538 Deficiency of other specified B group vitamins: Secondary | ICD-10-CM | POA: Diagnosis not present

## 2022-10-27 DIAGNOSIS — R300 Dysuria: Secondary | ICD-10-CM | POA: Diagnosis not present

## 2022-10-27 DIAGNOSIS — E785 Hyperlipidemia, unspecified: Secondary | ICD-10-CM | POA: Diagnosis not present

## 2022-10-27 DIAGNOSIS — E1169 Type 2 diabetes mellitus with other specified complication: Secondary | ICD-10-CM | POA: Diagnosis not present

## 2022-10-27 DIAGNOSIS — I10 Essential (primary) hypertension: Secondary | ICD-10-CM | POA: Diagnosis not present

## 2022-10-27 DIAGNOSIS — Z79899 Other long term (current) drug therapy: Secondary | ICD-10-CM | POA: Diagnosis not present

## 2022-10-28 DIAGNOSIS — I4819 Other persistent atrial fibrillation: Secondary | ICD-10-CM | POA: Diagnosis not present

## 2022-10-28 DIAGNOSIS — Z48817 Encounter for surgical aftercare following surgery on the skin and subcutaneous tissue: Secondary | ICD-10-CM | POA: Diagnosis not present

## 2022-10-28 DIAGNOSIS — N189 Chronic kidney disease, unspecified: Secondary | ICD-10-CM | POA: Diagnosis not present

## 2022-10-28 DIAGNOSIS — D649 Anemia, unspecified: Secondary | ICD-10-CM | POA: Diagnosis not present

## 2022-10-28 DIAGNOSIS — W19XXXD Unspecified fall, subsequent encounter: Secondary | ICD-10-CM | POA: Diagnosis not present

## 2022-10-28 DIAGNOSIS — Z7901 Long term (current) use of anticoagulants: Secondary | ICD-10-CM | POA: Diagnosis not present

## 2022-10-28 DIAGNOSIS — S5012XD Contusion of left forearm, subsequent encounter: Secondary | ICD-10-CM | POA: Diagnosis not present

## 2022-10-28 DIAGNOSIS — I5032 Chronic diastolic (congestive) heart failure: Secondary | ICD-10-CM | POA: Diagnosis not present

## 2022-10-28 DIAGNOSIS — I110 Hypertensive heart disease with heart failure: Secondary | ICD-10-CM | POA: Diagnosis not present

## 2022-11-06 DIAGNOSIS — Z48817 Encounter for surgical aftercare following surgery on the skin and subcutaneous tissue: Secondary | ICD-10-CM | POA: Diagnosis not present

## 2022-11-06 DIAGNOSIS — N189 Chronic kidney disease, unspecified: Secondary | ICD-10-CM | POA: Diagnosis not present

## 2022-11-06 DIAGNOSIS — I5032 Chronic diastolic (congestive) heart failure: Secondary | ICD-10-CM | POA: Diagnosis not present

## 2022-11-06 DIAGNOSIS — D649 Anemia, unspecified: Secondary | ICD-10-CM | POA: Diagnosis not present

## 2022-11-06 DIAGNOSIS — W19XXXD Unspecified fall, subsequent encounter: Secondary | ICD-10-CM | POA: Diagnosis not present

## 2022-11-06 DIAGNOSIS — Z7901 Long term (current) use of anticoagulants: Secondary | ICD-10-CM | POA: Diagnosis not present

## 2022-11-06 DIAGNOSIS — I110 Hypertensive heart disease with heart failure: Secondary | ICD-10-CM | POA: Diagnosis not present

## 2022-11-06 DIAGNOSIS — I4819 Other persistent atrial fibrillation: Secondary | ICD-10-CM | POA: Diagnosis not present

## 2022-11-06 DIAGNOSIS — S5012XD Contusion of left forearm, subsequent encounter: Secondary | ICD-10-CM | POA: Diagnosis not present

## 2022-11-12 ENCOUNTER — Ambulatory Visit: Payer: Self-pay

## 2022-11-12 NOTE — Patient Outreach (Signed)
  Care Coordination   Follow Up Visit Note   11/12/2022 Name: Lori Rodriguez MRN: [id number] DOB: 1934/12/16  Lori Rodriguez Lori Rodriguez is a 88 y.o. year old female who sees Potts, Lori Oppenheim, NP for primary care. I spoke with  Lori Rodriguez by phone today.  What matters to the patients health and wellness today?  Patient reports that she fell in the screened in porch with arm pain.  Reports 2 days later arm got really swollen.  Reports she went to [facility] and was supposed to have surgery. Reports she had surgery and arm was "cleaned out"  reports now arm is a lot better.  Reports arm is healed.   Denies any fracture and states no fall since.    Reports she is continuing to do her exercises.      Goals Addressed               This Visit's Progress     COMPLETED: Avoid falls (pt-stated)        Interventions Today    Flowsheet Row Most Recent Value  Chronic Disease   Chronic disease during today's visit Other  [falls]  General Interventions   General Interventions Discussed/Reviewed General Interventions Discussed  Exercise Interventions   Exercise Discussed/Reviewed Exercise Discussed, Physical Activity  Physical Activity Discussed/Reviewed Physical Activity Discussed, Home Exercise Program (HEP)  Safety Interventions   Safety Discussed/Reviewed Safety Discussed, Fall Risk  [Reviewed recent fall and surgery. Reviewed reason for fall ( 4 prong cane not completed on step)  reviewed fall prevention. Provided my contact information for patient to call me if needed.]              SDOH assessments and interventions completed:  No     Care Coordination Interventions:  Yes, provided   Follow up plan:  patient to call me in the future if needed. Provided contact information    Encounter Outcome:  Pt. Visit Completed   Lori Rand, RN, BSN, CEN [place] Coordinator [phone]

## 2022-11-20 DIAGNOSIS — S5012XD Contusion of left forearm, subsequent encounter: Secondary | ICD-10-CM | POA: Diagnosis not present

## 2022-11-20 DIAGNOSIS — I110 Hypertensive heart disease with heart failure: Secondary | ICD-10-CM | POA: Diagnosis not present

## 2022-11-20 DIAGNOSIS — W19XXXD Unspecified fall, subsequent encounter: Secondary | ICD-10-CM | POA: Diagnosis not present

## 2022-11-20 DIAGNOSIS — I5032 Chronic diastolic (congestive) heart failure: Secondary | ICD-10-CM | POA: Diagnosis not present

## 2022-11-20 DIAGNOSIS — N189 Chronic kidney disease, unspecified: Secondary | ICD-10-CM | POA: Diagnosis not present

## 2022-11-20 DIAGNOSIS — D649 Anemia, unspecified: Secondary | ICD-10-CM | POA: Diagnosis not present

## 2022-11-20 DIAGNOSIS — I4819 Other persistent atrial fibrillation: Secondary | ICD-10-CM | POA: Diagnosis not present

## 2022-11-20 DIAGNOSIS — Z7901 Long term (current) use of anticoagulants: Secondary | ICD-10-CM | POA: Diagnosis not present

## 2022-11-20 DIAGNOSIS — Z48817 Encounter for surgical aftercare following surgery on the skin and subcutaneous tissue: Secondary | ICD-10-CM | POA: Diagnosis not present

## 2022-11-21 DIAGNOSIS — N3001 Acute cystitis with hematuria: Secondary | ICD-10-CM | POA: Diagnosis not present

## 2022-11-21 DIAGNOSIS — Z6828 Body mass index (BMI) 28.0-28.9, adult: Secondary | ICD-10-CM | POA: Diagnosis not present

## 2022-11-21 DIAGNOSIS — R350 Frequency of micturition: Secondary | ICD-10-CM | POA: Diagnosis not present

## 2022-11-21 NOTE — Progress Notes (Incomplete)
[facility]  [address] [place],  Lori Rodriguez  [address] [phone]   Clinic Day: 11/21/22     Referring physician: Ocie Doyne., MD    ASSESSMENT & PLAN:  Assessment:   1. Stage IA hormone receptor breast cancer.  She was treated with surgery and hormonal therapy.  She completed 5 years in May 2020. She remains without evidence of recurrence.  She will be due for mammogram and follow-up with Dr. Noberto Retort in August.   2. Solid right renal lesions, which were suspicious for renal cell carcinoma.  A follow up CT in June 2021 did not appear concerning.   3. Thyroid nodules.  Repeat ultrasound from March 2021 remains stable.  One more repeat examination was recommended for 5 year stability.   Plan: We will plan to see her back in 1 year for reexamination.  The patient understands the plans discussed today and is in agreement with them.  She knows to contact our office if she develops concerns regarding her breast cancer.   I provided 15 minutes of face-to-face time during this this encounter and > 50% was spent counseling as documented under my assessment and plan.    Derwood Kaplan, MD [place] [address] [place] [address] Dept: [phone] Dept Fax: [phone]   CHIEF COMPLAINT:   CC:  History of stage IA hormone receptor positive left breast cancer   Chief Complaint: Surveillance   HISTORY OF PRESENT ILLNESS:  Lori Rodriguez is a 88 y.o. female with a history of stage IA (T1c N0 M0) hormone receptor positive left breast cancer diagnosed in February 2015.  She was treated with lumpectomy.  Pathology revealed a 1.7 cm, grade 1, invasive ductal carcinoma in the background of low-grade ductal carcinoma in situ.  Four sentinel nodes were negative for metastasis.  Estrogen and progesterone receptors were positive and her 2 Neu negative.  Ki-67 was 13%.  She  had multiple favorable prognostic characteristics, so adjuvant chemotherapy was not recommended.  She declined adjuvant radiation therapy.  She was placed on anastrozole 1 mg daily in May 2015.  She was hospitalized in October 2017 with pneumonia.  CTA chest revealed interstitial edema versus infection, as well as areas of atelectasis and a right thyroid nodule, which was small and benign.  Bone density scan in June 2017 was normal.   She has remained without evidence of recurrent disease.  She had a right hip replacement in July 2017.  She underwent a left hip replacement in May 2018.   Bone density scan in March 2020 remained normal.  Since her bone density scan was normal even after nearly 5 years of anastrozole, we probably do not need to repeat this again.  She completed 5 years of anastrozole in May 2020.     Abdominal ultrasound in October 2019 was done due to abdominal pain and revealed a solid-appearing lesion of the right kidney.  She therefore underwent CT abdomen in November 2019 which revealed a 1.9 cm solid lesion of the right kidney, as well as a 1.2 cm solid lesion of the right kidney that had increased in size.  There was a 4.8 cm cyst of the left kidney which was a Bosniak 1.  She had seen Dr. Nila Nephew who referred her to Dr. Aletta Edouard to discuss possible cryoablation.  She was able to have the MRI abdomen in August 2020 which revealed 2  solid enhancing masses of the right kidney.  The lesion in the right mid kidney was essentially stable, measuring 2.1 x 2 cm.  The lesion in the right upper pole had minimally increased in size measuring, 1.6 x 1.3 cm, previously 1.5 x 1.2 cm.  She states that Dr. Kathlene Cote did not recommend ablation at that time.  She has heart failure and atrial fibrillation and is followed by [facility].  She states she is back on diuretic and continues metoprolol and rivaroxaban.  She had a CT scan in June 2021, which did not even show definite renal masses, but they  describe perinephric stranding.  She did have a follow up thyroid ultrasound from March 2021 revealed no significant interval change in the size, appearance or characteristics of the 1.8 cm. Recommend 1 additional follow-up ultrasound in October 2022 to confirm 5 year stability and thus benignity.  She has had bilateral hip replacements.  INTERVAL HISTORY:  Lori Rodriguez is here for annual follow up and states that she has been doing well recently.  Annual mammogram from August 2021 was clear.  She will be due for annual mammogram and to see Dr. Noberto Retort this August.  She states that she did have one severe fall between Thanksgiving and Christmas, resulting in severe facial bruising, left knee and left ankle injury.  She now uses a cane to ambulate at all times.  Her  appetite is good, and she has lost 11 pounds since her last visit.  She denies fever, chills or other signs of infection.  She denies nausea, vomiting, bowel issues, or abdominal pain.  She denies sore throat, cough, dyspnea, or chest pain.   REVIEW OF SYMPTOMS:  Review of Systems - Oncology  VITALS:   PHYSICAL EXAM:   HISTORY:   Allergies  Allergen Reactions   Lisinopril Cough   Percocet [Oxycodone-Acetaminophen] Other (See Comments)    Hallucinations   Valium [Diazepam] Other (See Comments)    Confusion   Prednisone Anxiety and Other (See Comments)     Dizziness, weakness     Past Medical History:  Diagnosis Date   Anemia    Anginal pain ([place])    Atrial fibrillation ([place])    Cancer ([place])    Left Breast Cancer   Cancer of central portion of left female breast ([place]) 03/10/2016   CHF (congestive heart failure) ([facility])    Chronic back pain    Chronic kidney disease    Right Renal Mass   Diabetes mellitus without complication ([facility])    DJD (degenerative joint disease)    Dysrhythmia    Atrial fibrillation   Encounter for preprocedural cardiovascular examination 02/28/2016   Formatting of this note might be different from the  original. Overview:  Remote normal coronary arteriography and echo 2015 with normal EF% Formatting of this note might be different from the original. Remote normal coronary arteriography and echo 2015 with normal EF%   Estrogen receptor positive neoplasm 03/10/2016   Hypertension    Hypertensive heart disease 02/28/2016   Long term (current) use of anticoagulants 02/29/2016   Overview:  Overview:  apixaban Overview:  apixaban  Formatting of this note might be different from the original. Overview:  apixaban Formatting of this note might be different from the original. apixaban   Mixed hyperlipidemia 02/29/2016   Orthostatic hypotension 12/01/2018   Osteoporosis    Persistent atrial fibrillation ([place]) 02/28/2016   Overview:  Overview:  CHADS2 vasc score= 4  Overview:  CHADS2 vasc score= 4 Overview:  CHADS2 vasc score= 4  Formatting of this note might be different from the original. Overview:  CHADS2 vasc score= 4  Overview:  CHADS2 vasc score= 4 Formatting of this note might be different from the original. CHADS2 vasc score= 4   Right renal mass    Secondary pulmonary arterial hypertension ([place]) 11/03/2018   Type 2 diabetes mellitus ([place]) 02/29/2016   Vitamin B12 deficiency    Vitamin D deficiency    Past Surgical History:  Procedure Laterality Date   BREAST LUMPECTOMY Left    CARDIAC CATHETERIZATION     CATARACT EXTRACTION, BILATERAL     CHOLECYSTECTOMY     IR RADIOLOGIST EVAL & MGMT  09/29/2018   IR RADIOLOGIST EVAL & MGMT  05/12/2019   IR RADIOLOGIST EVAL & MGMT  05/22/2020   TOTAL HIP ARTHROPLASTY Bilateral    TOTAL HIP REVISION Right 01/24/2021   Procedure: TOTAL HIP REVISION;  Surgeon: Rod Can, MD;  Location: [facility];  Service: Orthopedics;  Laterality: Right;   Family History  Problem Relation Age of Onset   Heart attack Father    Heart attack Brother    Diabetes Brother    Myasthenia gravis Brother    Social History   Substance and Sexual Activity  Alcohol Use Never    Social  History   Substance and Sexual Activity  Drug Use Never    Social History   Tobacco Use  Smoking Status Never   Passive exposure: Never  Smokeless Tobacco Never   Current Outpatient Medications:    acetaminophen (TYLENOL) 325 MG tablet, Take 1-2 tablets (325-650 mg total) by mouth every 6 (six) hours as needed for mild pain (pain score 1-3 or temp > 100.5)., Disp: 100 tablet, Rfl: 0   acidophilus (RISAQUAD) CAPS capsule, Take 1 capsule by mouth daily., Disp: , Rfl:    albuterol (PROVENTIL HFA;VENTOLIN HFA) 108 (90 Base) MCG/ACT inhaler, Inhale 2 puffs into the lungs 4 (four) times daily as needed for wheezing or shortness of breath., Disp: , Rfl:    anastrozole (ARIMIDEX) 1 MG tablet, Take by mouth. (Patient not taking: Reported on 11/22/2021), Disp: , Rfl:    Calcium Carbonate-Vitamin D (CALCIUM-VITAMIN D3) 600-125 MG-UNIT TABS, Take 1 capsule by mouth daily., Disp: , Rfl:    colchicine 0.6 MG tablet, Take by mouth., Disp: , Rfl:    Cyanocobalamin (VITAMIN B-12) 5000 MCG TBDP, Take 5,000 mcg by mouth daily. Take 2 tablets daily, Disp: , Rfl:    ferrous sulfate 325 (65 FE) MG tablet, Take 65 mg by mouth every other day., Disp: , Rfl:    fluticasone furoate-vilanterol (BREO ELLIPTA) 100-25 MCG/INH AEPB, Inhale 1 puff into the lungs daily., Disp: , Rfl:    glipiZIDE (GLUCOTROL XL) 5 MG 24 hr tablet, Take 5 mg by mouth daily., Disp: , Rfl:    labetalol (NORMODYNE) 200 MG tablet, Take one-half tablet ( 100 mg) by mouth one time daily, Disp: 90 tablet, Rfl: 0   loperamide (IMODIUM A-D) 2 MG tablet, Take 2 mg by mouth 4 (four) times daily as needed for diarrhea or loose stools., Disp: , Rfl:    metFORMIN (GLUCOPHAGE) 1000 MG tablet, Take 1,000 mg by mouth 2 (two) times daily with a meal., Disp: , Rfl:    ondansetron (ZOFRAN) 4 MG tablet, Take 4 mg by mouth every 8 (eight) hours as needed for nausea or vomiting., Disp: , Rfl:    OVER THE COUNTER MEDICATION, Take 1 tablet by mouth daily. Pro-15 5  billion cfu  15 strains/, Disp: , Rfl:    OVER THE COUNTER MEDICATION, Take 2 tablets by mouth at bedtime. Stool Softner/ Unknown strength, Disp: , Rfl:    oxybutynin (DITROPAN-XL) 10 MG 24 hr tablet, Take 1 mg by mouth. Taking '5mg'$  twice a day, Disp: , Rfl:    rivaroxaban (XARELTO) 20 MG TABS tablet, TAKE 1 TABLET EVERY DAY, Disp: 90 tablet, Rfl: 1   rOPINIRole (REQUIP) 0.25 MG tablet, Take by mouth., Disp: , Rfl:    rosuvastatin (CRESTOR) 40 MG tablet, Take 40 mg by mouth daily., Disp: , Rfl:    sertraline (ZOLOFT) 100 MG tablet, Take 100 mg by mouth daily. , Disp: , Rfl:    tolterodine (DETROL LA) 4 MG 24 hr capsule, Take 4 mg by mouth every morning., Disp: , Rfl:    torsemide (DEMADEX) 20 MG tablet, Take one tablet by mouth daily in the morning. Take an extra tablet in the evening if you weigh 174 or greater., Disp: 60 tablet, Rfl: 5   traMADol (ULTRAM) 50 MG tablet, Take 50 mg by mouth 2 (two) times daily., Disp: , Rfl:    TRUE METRIX BLOOD GLUCOSE TEST test strip, SMARTSIG:Via Meter, Disp: , Rfl:    Vitamin D, Ergocalciferol, (DRISDOL) 1.25 MG (50000 UNIT) CAPS capsule, Take 50,000 Units by mouth every 7 (seven) days., Disp: , Rfl:     LABS:   CBC  Ref Range & Units   09/25/2022  WBC, automated 3.4 - 11.0 10*3/uL 7.9  RBC 3.70 - 5.50 10*6/uL 3.77  Hemoglobin 12.0 - 16.0 g/dL 11.0 Low   Hematocrit 37.0 - 48.0 % 31.6 Low   MCV 80.0 - 97.0 fL 83.6  MCH 27.0 - 33.0 pg 29.1  MCHC 30.0 - 35.0 g/dL 34.8  RDW 0.0 - 14.7 % 14.6  Platelets 150 - 400 10*3/uL 196  MPV 7.5 - 10.2 fL 8.2   CMP  Ref Range & Units   09/25/2022  Sodium 136 - 144 mmol/L 139  Potassium 3.5 - 5.0 mmol/L 4.0  Chloride 98 - 108 mmol/L 107  CO2 22 - 29 mmol/L 23  BUN 8 - 20 mg/dl 19  Creatinine 0.57 - 1.11 mg/dL 0.75  Calcium 8.4 - 10.3 mg/dl 8.8  Albumin (g/dl) In Ser/Plas 3.7 - 4.8 g/dL 3.3 Low   Phosphorus 2.4 - 4.7 mg/dl 2.5  Glucose 70 - 140 mg/dL 139      Ref Range & Units   09/25/2022  Glucose 70  - 140 mg/dL 176 High    STUDIES:  EXAM: CT HEAD WITHOUT CONTRAST IMPRESSION: 1. No evidence of acute intracranial abnormality. 2. Right frontal scalp contusion without acute calvarial fracture. 3. Chronic microvascular ischemic disease.   I,Jasmine M Lassiter,acting as a scribe for Derwood Kaplan, MD.,have documented all relevant documentation on the behalf of Derwood Kaplan, MD,as directed by  Derwood Kaplan, MD while in the presence of Derwood Kaplan, MD.

## 2022-11-24 ENCOUNTER — Ambulatory Visit: Payer: Medicare PPO | Admitting: Oncology

## 2022-11-25 DIAGNOSIS — Z48817 Encounter for surgical aftercare following surgery on the skin and subcutaneous tissue: Secondary | ICD-10-CM | POA: Diagnosis not present

## 2022-11-25 DIAGNOSIS — Z7901 Long term (current) use of anticoagulants: Secondary | ICD-10-CM | POA: Diagnosis not present

## 2022-11-25 DIAGNOSIS — I4819 Other persistent atrial fibrillation: Secondary | ICD-10-CM | POA: Diagnosis not present

## 2022-11-25 DIAGNOSIS — W19XXXD Unspecified fall, subsequent encounter: Secondary | ICD-10-CM | POA: Diagnosis not present

## 2022-11-25 DIAGNOSIS — I110 Hypertensive heart disease with heart failure: Secondary | ICD-10-CM | POA: Diagnosis not present

## 2022-11-25 DIAGNOSIS — S5012XD Contusion of left forearm, subsequent encounter: Secondary | ICD-10-CM | POA: Diagnosis not present

## 2022-11-25 DIAGNOSIS — N189 Chronic kidney disease, unspecified: Secondary | ICD-10-CM | POA: Diagnosis not present

## 2022-11-25 DIAGNOSIS — D649 Anemia, unspecified: Secondary | ICD-10-CM | POA: Diagnosis not present

## 2022-11-25 DIAGNOSIS — I5032 Chronic diastolic (congestive) heart failure: Secondary | ICD-10-CM | POA: Diagnosis not present

## 2022-12-03 NOTE — Progress Notes (Unsigned)
Cardiology Office Note:    Date:  12/04/2022   ID:  Lori [facility], DOB Dec 25, 1934, MRN [id number]  PCP:  Lori Iba, NP  Cardiologist:  Lori More, MD    Referring MD: Lori Iba, NP    ASSESSMENT:    1. Permanent atrial fibrillation ([place])   2. Long term (current) use of anticoagulants   3. Hypertensive heart disease with chronic diastolic congestive heart failure ([place])   4. Orthostatic hypotension   5. Mixed hyperlipidemia   6. Type 2 diabetes mellitus without complication, without long-term current use of insulin (HCC)    PLAN:    In order of problems listed above:  She continues to do well with rate controlled atrial fibrillation and will continue her beta-blocker and current anticoagulant Xarelto BP is well-controlled continue current antihypertensives including torsemide labetalol.  No recurrence of orthostatic hypotension Continue current statin lipids are at target She did me that since she came out of the [facility] her blood sugars are now back in range she transiently required insulin   Next appointment: 9 months   Medication Adjustments/Labs and Tests Ordered: Current medicines are reviewed at length with the patient today.  Concerns regarding medicines are outlined above.  No orders of the defined types were placed in this encounter.  No orders of the defined types were placed in this encounter.   Chief Complaint  Patient presents with   Follow-up    History of Present Illness:    Lori Rodriguez is a 88 y.o. female with a hx of chronic atrial fibrillation with anticoagulation hypertensive heart disease with heart failure anemia and orthostatic hypotension last seen 10/09/2021.Her echocardiogram 01/23/2021 showed normal left ventricular size wall thickness EF 55 to 60% the right ventricle is mildly enlarged with mild hypokinesia moderate elevation of pulmonary artery systolic pressure in the left atrium was enlarged.  Compliance  with diet, lifestyle and medications: Yes  January for she took a tumble off her steps at home had an avulsion of skin on her wrist was at [facility] in [place] and for a few days she was off her anticoagulant she had a local I&D. Since then no further falls no bleeding episodes Overall she has done well takes care of herself and her daughter is not having cardiovascular symptoms of edema shortness of breath chest pain palpitation or syncope Recent labs 10/27/2022 cholesterol 147 LDL 73 A1c 8.2% hemoglobin 13.0 creatinine 0.98 potassium 4.9 Past Medical History:  Diagnosis Date   Anemia    Anginal pain (HCC)    Atrial fibrillation ([place])    Cancer ([place])    Left Breast Cancer   Cancer of central portion of left female breast ([place]) 03/10/2016   CHF (congestive heart failure) (HCC)    Chronic back pain    Chronic kidney disease    Right Renal Mass   Diabetes mellitus without complication (HCC)    DJD (degenerative joint disease)    Dysrhythmia    Atrial fibrillation   Encounter for preprocedural cardiovascular examination 02/28/2016   Formatting of this note might be different from the original. Overview:  Remote normal coronary arteriography and echo 2015 with normal EF% Formatting of this note might be different from the original. Remote normal coronary arteriography and echo 2015 with normal EF%   Estrogen receptor positive neoplasm 03/10/2016   Hypertension    Hypertensive heart disease 02/28/2016   Long term (current) use of anticoagulants 02/29/2016   Overview:  Overview:  apixaban Overview:  apixaban  Formatting of this note might be different from the original. Overview:  apixaban Formatting of this note might be different from the original. apixaban   Mixed hyperlipidemia 02/29/2016   Orthostatic hypotension 12/01/2018   Osteoporosis    Persistent atrial fibrillation ([place]) 02/28/2016   Overview:  Overview:  CHADS2 vasc score= 4  Overview:  CHADS2 vasc score= 4 Overview:  CHADS2 vasc  score= 4  Formatting of this note might be different from the original. Overview:  CHADS2 vasc score= 4  Overview:  CHADS2 vasc score= 4 Formatting of this note might be different from the original. CHADS2 vasc score= 4   Right renal mass    Secondary pulmonary arterial hypertension ([place]) 11/03/2018   Type 2 diabetes mellitus ([place]) 02/29/2016   Vitamin B12 deficiency    Vitamin D deficiency     Past Surgical History:  Procedure Laterality Date   BREAST LUMPECTOMY Left    CARDIAC CATHETERIZATION     CATARACT EXTRACTION, BILATERAL     CHOLECYSTECTOMY     IR RADIOLOGIST EVAL & MGMT  09/29/2018   IR RADIOLOGIST EVAL & MGMT  05/12/2019   IR RADIOLOGIST EVAL & MGMT  05/22/2020   TOTAL HIP ARTHROPLASTY Bilateral    TOTAL HIP REVISION Right 01/24/2021   Procedure: TOTAL HIP REVISION;  Surgeon: Rod Can, MD;  Location: [facility];  Service: Orthopedics;  Laterality: Right;    Current Medications: Current Meds  Medication Sig   acetaminophen (TYLENOL) 325 MG tablet Take 1-2 tablets (325-650 mg total) by mouth every 6 (six) hours as needed for mild pain (pain score 1-3 or temp > 100.5).   acidophilus (RISAQUAD) CAPS capsule Take 1 capsule by mouth daily.   albuterol (PROVENTIL HFA;VENTOLIN HFA) 108 (90 Base) MCG/ACT inhaler Inhale 2 puffs into the lungs 4 (four) times daily as needed for wheezing or shortness of breath.   Calcium Carbonate-Vitamin D (CALCIUM-VITAMIN D3) 600-125 MG-UNIT TABS Take 1 capsule by mouth daily.   colchicine 0.6 MG tablet Take by mouth.   Cyanocobalamin (VITAMIN B-12) 5000 MCG TBDP Take 5,000 mcg by mouth daily. Take 2 tablets daily   ferrous sulfate 325 (65 FE) MG tablet Take 65 mg by mouth every other day.   fluticasone furoate-vilanterol (BREO ELLIPTA) 100-25 MCG/INH AEPB Inhale 1 puff into the lungs daily.   glipiZIDE (GLUCOTROL XL) 5 MG 24 hr tablet Take 5 mg by mouth daily.   labetalol (NORMODYNE) 200 MG tablet Take one-half tablet ( 100 mg) by mouth one time daily    loperamide (IMODIUM A-D) 2 MG tablet Take 2 mg by mouth 4 (four) times daily as needed for diarrhea or loose stools.   metFORMIN (GLUCOPHAGE) 1000 MG tablet Take 1,000 mg by mouth 2 (two) times daily with a meal.   nitrofurantoin, macrocrystal-monohydrate, (MACROBID) 100 MG capsule Take 100 mg by mouth 2 (two) times daily.   ondansetron (ZOFRAN) 4 MG tablet Take 4 mg by mouth every 8 (eight) hours as needed for nausea or vomiting.   OVER THE COUNTER MEDICATION Take 1 tablet by mouth daily. Pro-15 5 billion cfu 15 strains/   OVER THE COUNTER MEDICATION Take 2 tablets by mouth at bedtime. Stool [place]/ Unknown strength   oxybutynin (DITROPAN-XL) 10 MG 24 hr tablet Take 1 mg by mouth. Taking '5mg'$  twice a day   rivaroxaban (XARELTO) 20 MG TABS tablet TAKE 1 TABLET EVERY DAY   rOPINIRole (REQUIP) 0.25 MG tablet Take by mouth.   rosuvastatin (CRESTOR) 40 MG tablet Take 40  mg by mouth daily.   sertraline (ZOLOFT) 100 MG tablet Take 100 mg by mouth daily.    tolterodine (DETROL LA) 4 MG 24 hr capsule Take 4 mg by mouth every morning.   torsemide (DEMADEX) 20 MG tablet Take one tablet by mouth daily in the morning. Take an extra tablet in the evening if you weigh 174 or greater.   traMADol (ULTRAM) 50 MG tablet Take 50 mg by mouth 2 (two) times daily.   TRUE METRIX BLOOD GLUCOSE TEST test strip SMARTSIG:Via Meter   Vitamin D, Ergocalciferol, (DRISDOL) 1.25 MG (50000 UNIT) CAPS capsule Take 50,000 Units by mouth every 7 (seven) days.     Allergies:   Lisinopril, Percocet [oxycodone-acetaminophen], Valium [diazepam], and Prednisone   Social History   Socioeconomic History   Marital status: Widowed    Spouse name: Not on file   Number of children: Not on file   Years of education: Not on file   Highest education level: Not on file  Occupational History   Not on file  Tobacco Use   Smoking status: Never    Passive exposure: Never   Smokeless tobacco: Never  Vaping Use   Vaping Use: Never used   Substance and Sexual Activity   Alcohol use: Never   Drug use: Never   Sexual activity: Not on file  Other Topics Concern   Not on file  Social History Narrative   Not on file   Social Determinants of Health   Financial Resource Strain: Not on file  Food Insecurity: No Food Insecurity (07/02/2022)   Hunger Vital Sign    Worried About Running Out of Food in the Last Year: Never true    Ran Out of Food in the Last Year: Never true  Transportation Needs: No Transportation Needs (07/02/2022)   PRAPARE - Hydrologist (Medical): No    Lack of Transportation (Non-Medical): No  Physical Activity: Not on file  Stress: Not on file  Social Connections: Not on file     Family History: The patient's family history includes Diabetes in her brother; Heart attack in her brother and father; Myasthenia gravis in her brother. ROS:   Please see the history of present illness.    All other systems reviewed and are negative.  EKGs/Labs/Other Studies Reviewed:    The following studies were reviewed today:  Cardiac Studies & Procedures       ECHOCARDIOGRAM  ECHOCARDIOGRAM COMPLETE 01/23/2021  Narrative ECHOCARDIOGRAM REPORT    Patient Name:   Lori [facility] Date of Exam: 01/23/2021 Medical Rec #:  [id number]                Height:       67.0 in Accession #:    [id number]               Weight:       181.4 lb Date of Birth:  [birth year]                BSA:          1.940 m Patient Age:    30 years                 BP:           152/73 mmHg Patient Gender: F                        HR:  88 bpm. Exam Location:  Inpatient  Procedure: 2D Echo, Cardiac Doppler and Color Doppler  Indications:    Pre-op cardiac evaluation  History:        Patient has prior history of Echocardiogram examinations, most recent 05/14/2020. CHF, Pulmonary HTN, Arrythmias:Atrial Fibrillation; Risk Factors:Diabetes, Hypertension and Dyslipidemia.  Sonographer:    Luisa Hart RDCS Referring Phys: [place]   1. Left ventricular ejection fraction, by estimation, is 55 to 60%. The left ventricle has normal function. The left ventricle has no regional wall motion abnormalities. Left ventricular diastolic parameters are indeterminate. 2. Right ventricular systolic function is mildly reduced. The right ventricular size is mildly enlarged. There is moderately elevated pulmonary artery systolic pressure. 3. Left atrial size was moderately dilated. 4. Right atrial size was moderately dilated. 5. The mitral valve is degenerative. Trivial mitral valve regurgitation. No evidence of mitral stenosis. 6. The aortic valve was not well visualized. There is mild calcification of the aortic valve. Aortic valve regurgitation is not visualized. Mild aortic valve sclerosis is present, with no evidence of aortic valve stenosis. 7. The inferior vena cava is normal in size with greater than 50% respiratory variability, suggesting right atrial pressure of 3 mmHg.  FINDINGS Left Ventricle: Left ventricular ejection fraction, by estimation, is 55 to 60%. The left ventricle has normal function. The left ventricle has no regional wall motion abnormalities. The left ventricular internal cavity size was normal in size. There is no left ventricular hypertrophy. Left ventricular diastolic parameters are indeterminate.  Right Ventricle: The right ventricular size is mildly enlarged. Right vetricular wall thickness was not assessed. Right ventricular systolic function is mildly reduced. There is moderately elevated pulmonary artery systolic pressure. The tricuspid regurgitant velocity is 3.31 m/s, and with an assumed right atrial pressure of 3 mmHg, the estimated right ventricular systolic pressure is [id number].  Left Atrium: Left atrial size was moderately dilated.  Right Atrium: Right atrial size was moderately dilated.  Pericardium: There is no evidence of  pericardial effusion.  Mitral Valve: The mitral valve is degenerative in appearance. There is mild thickening of the mitral valve leaflet(s). There is mild calcification of the mitral valve leaflet(s). Mild mitral annular calcification. Trivial mitral valve regurgitation. No evidence of mitral valve stenosis.  Tricuspid Valve: The tricuspid valve is normal in structure. Tricuspid valve regurgitation is mild . No evidence of tricuspid stenosis.  Aortic Valve: The aortic valve was not well visualized. There is mild calcification of the aortic valve. Aortic valve regurgitation is not visualized. Mild aortic valve sclerosis is present, with no evidence of aortic valve stenosis. Aortic valve mean gradient measures 3.5 mmHg. Aortic valve peak gradient measures 7.1 mmHg. Aortic valve area, by VTI measures 2.44 cm.  Pulmonic Valve: The pulmonic valve was normal in structure. Pulmonic valve regurgitation is not visualized. No evidence of pulmonic stenosis.  Aorta: The aortic root is normal in size and structure.  Venous: The inferior vena cava is normal in size with greater than 50% respiratory variability, suggesting right atrial pressure of 3 mmHg.  IAS/Shunts: No atrial level shunt detected by color flow Doppler.   LEFT VENTRICLE PLAX 2D LVIDd:         5.30 cm     Diastology LVIDs:         3.70 cm     LV e' medial:    5.00 cm/s LV PW:         1.20 cm     LV E/e'  medial:  31.4 LV IVS:        0.90 cm     LV e' lateral:   6.09 cm/s LVOT diam:     1.90 cm     LV E/e' lateral: 25.8 LV SV:         51 LV SV Index:   26 LVOT Area:     2.84 cm  LV Volumes (MOD) LV vol d, MOD A2C: 39.6 ml LV vol d, MOD A4C: 54.8 ml LV vol s, MOD A2C: 18.5 ml LV vol s, MOD A4C: 20.4 ml LV SV MOD A2C:     21.1 ml LV SV MOD A4C:     54.8 ml LV SV MOD BP:      27.9 ml  RIGHT VENTRICLE RV S prime:     8.92 cm/s TAPSE (M-mode): 1.2 cm  LEFT ATRIUM             Index       RIGHT ATRIUM           Index LA diam:         4.60 cm 2.37 cm/m  RA Area:     24.00 cm LA Vol (A2C):   47.3 ml 24.38 ml/m RA Volume:   67.20 ml  34.63 ml/m LA Vol (A4C):   50.7 ml 26.13 ml/m LA Biplane Vol: 50.4 ml 25.98 ml/m AORTIC VALVE                   PULMONIC VALVE AV Area (Vmax):    2.29 cm    PV Vmax:       1.03 m/s AV Area (Vmean):   2.34 cm    PV Vmean:      79.000 cm/s AV Area (VTI):     2.44 cm    PV VTI:        0.189 m AV Vmax:           133.50 cm/s PV Peak grad:  4.2 mmHg AV Vmean:          92.100 cm/s PV Mean grad:  3.0 mmHg AV VTI:            0.209 m AV Peak Grad:      7.1 mmHg AV Mean Grad:      3.5 mmHg LVOT Vmax:         108.00 cm/s LVOT Vmean:        75.900 cm/s LVOT VTI:          0.180 m LVOT/AV VTI ratio: 0.86  AORTA Ao Root diam: 3.30 cm Ao Asc diam:  3.00 cm  MITRAL VALVE                TRICUSPID VALVE MV Area (PHT): 5.02 cm     TR Peak grad:   43.8 mmHg MV Decel Time: 151 msec     TR Vmax:        331.00 cm/s MR Peak grad: 79.9 mmHg MR Vmax:      447.00 cm/s   SHUNTS MV E velocity: 157.00 cm/s  Systemic VTI:  0.18 m Systemic Diam: 1.90 cm  Jenkins Rouge MD Electronically signed by Jenkins Rouge MD Signature Date/Time: 01/23/2021/2:20:48 PM    Final    MONITORS  LONG TERM MONITOR (3-14 DAYS) 07/29/2020  Narrative A ZIO monitor was performed for 4 days and 9 hours to assess atrial fibrillation beginning 07/12/2020.  The cardiac rhythm throughout was atrial fibrillation with average, minimum and maximum heart rates  of 92, 50 to 169 bpm. Daytime, rate was 50 to 110 bpm 90% of the time and 110-1 5010% of the time. Nighttime heart rate was 5210 bpm 93% of the time.  There were no pauses of 3 seconds or greater and no episodes of sustained bradycardia with atrial fibrillation.  Ventricular ectopy was rare with isolated PVCs and couplets.  There were 3 triggered events.  All were associated with atrial fibrillation.  Conclusion atrial fibrillation, good heart rate control.            EKG:  EKG ordered today and personally reviewed.  The ekg ordered today demonstrates atrial fibrillation controlled ventricular rate RSR prime pattern but does not meet criteria for right bundle branch block    Physical Exam:    VS:  BP 120/70 (BP Location: Right Arm, Patient Position: Sitting, Cuff Size: Normal)   Pulse 76   Ht '5\' 7"'$  (1.702 m)   Wt 177 lb (80.3 kg)   SpO2 96%   BMI 27.72 kg/m     Wt Readings from Last 3 Encounters:  12/04/22 177 lb (80.3 kg)  12/06/21 176 lb (79.8 kg)  11/22/21 173 lb 11.2 oz (78.8 kg)     GEN: Appears her age well nourished, well developed in no acute distress HEENT: Normal NECK: No JVD; No carotid bruits LYMPHATICS: No lymphadenopathy CARDIAC: Variable first heart sound regular rhythm RRR, no murmurs, rubs, gallops RESPIRATORY:  Clear to auscultation without rales, wheezing or rhonchi  ABDOMEN: Soft, non-tender, non-distended MUSCULOSKELETAL:  No edema; No deformity  SKIN: Warm and dry NEUROLOGIC:  Alert and oriented x 3 PSYCHIATRIC:  Normal affect    Signed, Lori More, MD  12/04/2022 2:49 PM    [place]

## 2022-12-04 ENCOUNTER — Encounter: Payer: Self-pay | Admitting: Cardiology

## 2022-12-04 ENCOUNTER — Ambulatory Visit: Payer: Medicare PPO | Attending: Cardiology | Admitting: Cardiology

## 2022-12-04 VITALS — BP 120/70 | HR 76 | Ht 67.0 in | Wt 177.0 lb

## 2022-12-04 DIAGNOSIS — I951 Orthostatic hypotension: Secondary | ICD-10-CM

## 2022-12-04 DIAGNOSIS — I4821 Permanent atrial fibrillation: Secondary | ICD-10-CM

## 2022-12-04 DIAGNOSIS — E782 Mixed hyperlipidemia: Secondary | ICD-10-CM | POA: Diagnosis not present

## 2022-12-04 DIAGNOSIS — E119 Type 2 diabetes mellitus without complications: Secondary | ICD-10-CM

## 2022-12-04 DIAGNOSIS — I110 Hypertensive heart disease with heart failure: Secondary | ICD-10-CM

## 2022-12-04 DIAGNOSIS — I5032 Chronic diastolic (congestive) heart failure: Secondary | ICD-10-CM | POA: Diagnosis not present

## 2022-12-04 DIAGNOSIS — Z7901 Long term (current) use of anticoagulants: Secondary | ICD-10-CM | POA: Diagnosis not present

## 2022-12-04 NOTE — Patient Instructions (Signed)
Medication Instructions:  Your physician recommends that you continue on your current medications as directed. Please refer to the Current Medication list given to you today.  *If you need a refill on your cardiac medications before your next appointment, please call your pharmacy*   Lab Work: None If you have labs (blood work) drawn today and your tests are completely normal, you will receive your results only by: MyChart Message (if you have MyChart) OR A paper copy in the mail If you have any lab test that is abnormal or we need to change your treatment, we will call you to review the results.   Testing/Procedures: None   Follow-Up: At [facility], you and your health needs are our priority.  As part of our continuing mission to provide you with exceptional heart care, we have created designated Provider Care Teams.  These Care Teams include your primary Cardiologist (physician) and Advanced Practice Providers (APPs -  Physician Assistants and Nurse Practitioners) who all work together to provide you with the care you need, when you need it.  We recommend signing up for the patient portal called "MyChart".  Sign up information is provided on this After Visit Summary.  MyChart is used to connect with patients for Virtual Visits (Telemedicine).  Patients are able to view lab/test results, encounter notes, upcoming appointments, etc.  Non-urgent messages can be sent to your provider as well.   To learn more about what you can do with MyChart, go to [email or web address].    Your next appointment:   9 month(s)  Provider:   Brian Munley, MD    Other Instructions None  

## 2022-12-08 ENCOUNTER — Ambulatory Visit: Payer: Medicare PPO | Admitting: Oncology

## 2022-12-16 DIAGNOSIS — S7001XA Contusion of right hip, initial encounter: Secondary | ICD-10-CM | POA: Diagnosis not present

## 2022-12-16 DIAGNOSIS — S0003XA Contusion of scalp, initial encounter: Secondary | ICD-10-CM | POA: Diagnosis not present

## 2022-12-16 DIAGNOSIS — R7989 Other specified abnormal findings of blood chemistry: Secondary | ICD-10-CM | POA: Diagnosis not present

## 2022-12-16 DIAGNOSIS — S199XXA Unspecified injury of neck, initial encounter: Secondary | ICD-10-CM | POA: Diagnosis not present

## 2022-12-16 DIAGNOSIS — R55 Syncope and collapse: Secondary | ICD-10-CM | POA: Diagnosis not present

## 2022-12-16 DIAGNOSIS — R911 Solitary pulmonary nodule: Secondary | ICD-10-CM | POA: Diagnosis not present

## 2022-12-16 DIAGNOSIS — E785 Hyperlipidemia, unspecified: Secondary | ICD-10-CM | POA: Diagnosis not present

## 2022-12-16 DIAGNOSIS — S7011XA Contusion of right thigh, initial encounter: Secondary | ICD-10-CM | POA: Diagnosis not present

## 2022-12-16 DIAGNOSIS — E042 Nontoxic multinodular goiter: Secondary | ICD-10-CM | POA: Diagnosis not present

## 2022-12-16 DIAGNOSIS — R9089 Other abnormal findings on diagnostic imaging of central nervous system: Secondary | ICD-10-CM | POA: Diagnosis not present

## 2022-12-16 DIAGNOSIS — Z043 Encounter for examination and observation following other accident: Secondary | ICD-10-CM | POA: Diagnosis not present

## 2022-12-16 DIAGNOSIS — I10 Essential (primary) hypertension: Secondary | ICD-10-CM | POA: Diagnosis not present

## 2022-12-16 DIAGNOSIS — E049 Nontoxic goiter, unspecified: Secondary | ICD-10-CM | POA: Diagnosis not present

## 2022-12-16 DIAGNOSIS — S0990XA Unspecified injury of head, initial encounter: Secondary | ICD-10-CM | POA: Diagnosis not present

## 2022-12-16 DIAGNOSIS — Z96643 Presence of artificial hip joint, bilateral: Secondary | ICD-10-CM | POA: Diagnosis not present

## 2022-12-17 ENCOUNTER — Inpatient Hospital Stay: Payer: Medicare PPO | Attending: Oncology | Admitting: Oncology

## 2022-12-17 ENCOUNTER — Other Ambulatory Visit: Payer: Self-pay | Admitting: Oncology

## 2022-12-17 ENCOUNTER — Encounter: Payer: Self-pay | Admitting: Oncology

## 2022-12-17 VITALS — BP 135/61 | HR 110 | Temp 97.4°F | Resp 22 | Ht 67.0 in | Wt 178.7 lb

## 2022-12-17 DIAGNOSIS — C50212 Malignant neoplasm of upper-inner quadrant of left female breast: Secondary | ICD-10-CM | POA: Diagnosis not present

## 2022-12-17 DIAGNOSIS — R911 Solitary pulmonary nodule: Secondary | ICD-10-CM

## 2022-12-17 DIAGNOSIS — Z170 Estrogen receptor positive status [ER+]: Secondary | ICD-10-CM

## 2022-12-17 DIAGNOSIS — E042 Nontoxic multinodular goiter: Secondary | ICD-10-CM

## 2022-12-17 NOTE — Progress Notes (Signed)
[facility]  [address] [place],  [place]  [address] [phone]  Clinic Day: 12/17/22  Referring physician: Guadalupe Maple., MD  ASSESSMENT & PLAN:  Assessment:   1. Stage IA hormone receptor breast cancer.  She was treated with surgery and hormonal therapy.  She completed 5 years in May 2020. She remains without evidence of recurrence.  She will be due for mammogram and follow-up with Dr. Georgiana Shore in August.   2. Solid right renal lesions, which were suspicious for renal cell carcinoma.  A follow up MRI in June 2021 did not appear concerning.   3. Thyroid nodules.  Repeat ultrasound from March 2021 remains stable.  I think it is time to repeat an ultrasound, especially since they saw numerous thyroid nodules in the right lobe measuring up to 2.3cm.   4. Nodule of the right upper lobe of the lung seen on a CT of the neck. I feel this is likely benign and she is a never-smoker and we will get a CT scan to follow up on this.    Plan: I recommend a CT of the chest to follow up on the nodule seen on CT neck in her right upper lung measuring 1.0cm. She also needs an ultrasound of the thyroid to follow up on her nodules. I will see her back in 1 year for reevaluation.  The patient understands the plans discussed today and is in agreement with them.  She knows to contact our office if she develops concerns regarding her breast cancer.    I provided 15 minutes of face-to-face time during this this encounter and > 50% was spent counseling as documented under my assessment and plan.    Dellia Beckwith, MD [facility] [address] [place] [address] Dept: [phone] Dept Fax: [phone]   CHIEF COMPLAINT:  CC: History of stage IA hormone receptor positive left breast cancer   Current Treatment:  Surveillance   HISTORY OF PRESENT ILLNESS:  Lori Rodriguez is a 88 y.o.  female with a history of stage IA (T1c N0 M0) hormone receptor positive left breast cancer diagnosed in February 2015.  She was treated with lumpectomy.  Pathology revealed a 1.7 cm, grade 1, invasive ductal carcinoma in the background of low-grade ductal carcinoma in situ.  Four sentinel nodes were negative for metastasis.  Estrogen and progesterone receptors were positive and her 2 Neu negative.  Ki-67 was 13%.  She had multiple favorable prognostic characteristics, so adjuvant chemotherapy was not recommended.  She declined adjuvant radiation therapy.  She was placed on anastrozole 1 mg daily in May 2015.  She was hospitalized in October 2017 with pneumonia.  CTA chest revealed interstitial edema versus infection, as well as areas of atelectasis and a right thyroid nodule, which was small and benign.  Bone density scan in June 2017 was normal.   She has remained without evidence of recurrent disease.  She had a right hip replacement in July 2017.  She underwent a left hip replacement in May 2018.   Bone density scan in March 2020 remained normal.  Since her bone density scan was normal even after nearly 5 years of anastrozole, we probably do not need to repeat this again.  She completed 5 years of anastrozole in May 2020.     Abdominal ultrasound in October 2019 was done due to abdominal pain and revealed a solid-appearing lesion of the  right kidney.  She therefore underwent CT abdomen in November 2019 which revealed a 1.9 cm solid lesion of the right kidney, as well as a 1.2 cm solid lesion of the right kidney that had increased in size.  There was a 4.8 cm cyst of the left kidney which was a Bosniak 1.  She had seen Dr. Saddie Benders who referred her to Dr. Irish Lack to discuss possible cryoablation.  She was able to have the MRI abdomen in August 2020 which revealed 2 solid enhancing masses of the right kidney.  The lesion in the right mid kidney was essentially stable, measuring 2.1 x 2 cm.  The lesion in the  right upper pole had minimally increased in size measuring, 1.6 x 1.3 cm, previously 1.5 x 1.2 cm.  She states that Dr. Fredia Sorrow did not recommend ablation at that time.  She has heart failure and atrial fibrillation and is followed by [facility].  She states she is back on diuretic and continues metoprolol and rivaroxaban.  She had a CT scan in June 2021, which did not even show definite renal masses, but they describe perinephric stranding.  She did have a follow up thyroid ultrasound from March 2021 revealed no significant interval change in the size, appearance or characteristics of the 1.8 cm. Recommend 1 additional follow-up ultrasound in October 2022 to confirm 5 year stability and thus benignity.  She has had bilateral hip replacements.   INTERVAL HISTORY:  Lori Rodriguez is here for annual follow up for her history of stage IA hormone receptor positive left breast cancer. Patient states that she hasn't been doing well and complains of right hip pain. She informed me that she had two falls recently, and in one she hit her head pretty hard. She states that when the fall happened she was walking and it felt like she was falling asleep before she fell and the other time her cane snapped in half while she was trying to fix her footing. She had a CT of her head and a CT scan of her cervical spine. It was revealed that she had a hematoma in the back of her head and a irregular shaped 1.0 x 0.7 cm nodule in the right upper lung. They also noted that the right thyroid is enlarged with numerous irregular thyroid nodules measuring up to 2.3cm in diameter.  I recommend a CT of the chest and ultrasound.of the neck. Patient informed me that she had a "pain spell" coming from her left underarm across her breast. She will see Dr. Georgiana Shore in the summer for her annual mammogram. Her MRI of the abdomen from 2020 revealed 2 renal masses measuring 1.6 cm and 1.5 cm, but then when they repeated it the next year they were  gone. Her MRI report from 2021 revealed multiple Bosniak class 1 and Bosniak class 2 cysts in the kidneys bilaterally. I will see her back in 1 year for reevaluation. She denies signs of infection such as sore throat, sinus drainage, cough, or urinary symptoms.  She denies fevers or recurrent chills. She denies pain. She denies nausea, vomiting, chest pain, dyspnea or cough. Her appetite is good and her weight has increased 2 pounds over last 10 days . Patient is accompanied at today's visit with her daughter.     REVIEW OF SYSTEMS:  Review of Systems  Constitutional:  Positive for fatigue. Negative for appetite change, chills, diaphoresis, fever and unexpected weight change.  HENT:  Negative.  Negative for hearing loss, lump/mass,  mouth sores, nosebleeds, sore throat, tinnitus, trouble swallowing and voice change.   Eyes: Negative.  Negative for eye problems and icterus.  Respiratory: Negative.  Negative for chest tightness, cough, hemoptysis, shortness of breath and wheezing.   Cardiovascular: Negative.  Negative for chest pain, leg swelling and palpitations.  Gastrointestinal: Negative.  Negative for abdominal distention, abdominal pain, blood in stool, constipation, diarrhea, nausea, rectal pain and vomiting.  Endocrine: Negative.   Genitourinary: Negative.  Negative for bladder incontinence, difficulty urinating, dyspareunia, dysuria, frequency, hematuria, menstrual problem, nocturia, pelvic pain, vaginal bleeding and vaginal discharge.   Musculoskeletal:  Positive for gait problem. Negative for arthralgias, back pain, flank pain, myalgias, neck pain and neck stiffness.       Right hip pain  Skin: Negative.  Negative for itching, rash and wound.  Neurological:  Positive for gait problem. Negative for dizziness, extremity weakness, headaches, light-headedness, numbness, seizures and speech difficulty.  Hematological: Negative.  Negative for adenopathy. Does not bruise/bleed easily.   Psychiatric/Behavioral: Negative.  Negative for confusion, decreased concentration, depression, sleep disturbance and suicidal ideas. The patient is not nervous/anxious.    VITALS:    Performance status (ECOG): 0 - Asymptomatic   PHYSICAL EXAM:  Physical Exam Vitals and nursing note reviewed.  Constitutional:      General: She is not in acute distress.    Appearance: Normal appearance. She is normal weight. She is not ill-appearing, toxic-appearing or diaphoretic.  HENT:     Head: Normocephalic and atraumatic.     Comments: Bump 3cm across mildly firm at the posterior central scalp    Right Ear: Tympanic membrane, ear canal and external ear normal. There is no impacted cerumen.     Left Ear: Tympanic membrane, ear canal and external ear normal. There is no impacted cerumen.     Nose: Nose normal. No congestion or rhinorrhea.     Mouth/Throat:     Mouth: Mucous membranes are moist.     Pharynx: Oropharynx is clear. No oropharyngeal exudate or posterior oropharyngeal erythema.  Eyes:     General: No scleral icterus.       Right eye: No discharge.        Left eye: No discharge.     Extraocular Movements: Extraocular movements intact.     Conjunctiva/sclera: Conjunctivae normal.     Pupils: Pupils are equal, round, and reactive to light.  Neck:     Vascular: No carotid bruit.  Cardiovascular:     Rate and Rhythm: Normal rate and regular rhythm.     Pulses: Normal pulses.     Heart sounds: Normal heart sounds. No murmur heard.    No friction rub. No gallop.  Pulmonary:     Effort: Pulmonary effort is normal. No respiratory distress.     Breath sounds: Normal breath sounds. No stridor. No wheezing, rhonchi or rales.  Chest:     Chest wall: No tenderness.     Comments: Deep scar of the left breast which is well healed and slightly nodular.  No masses in either breasts. Abdominal:     General: Bowel sounds are normal. There is no distension.     Palpations: Abdomen is soft.  There is no hepatomegaly, splenomegaly or mass.     Tenderness: There is no abdominal tenderness. There is no right CVA tenderness, left CVA tenderness, guarding or rebound.     Hernia: No hernia is present.  Musculoskeletal:        General: No swelling, tenderness, deformity or signs of  injury. Normal range of motion.     Cervical back: Normal range of motion and neck supple. No rigidity or tenderness.     Right lower leg: No edema.     Left lower leg: No edema.  Lymphadenopathy:     Cervical: No cervical adenopathy.     Right cervical: No superficial, deep or posterior cervical adenopathy.    Left cervical: No superficial, deep or posterior cervical adenopathy.     Upper Body:     Right upper body: No supraclavicular, axillary or pectoral adenopathy.     Left upper body: No supraclavicular, axillary or pectoral adenopathy.  Skin:    General: Skin is warm and dry.     Coloration: Skin is not jaundiced or pale.     Findings: No bruising, erythema, lesion or rash.  Neurological:     General: No focal deficit present.     Mental Status: She is alert and oriented to person, place, and time. Mental status is at baseline.     Cranial Nerves: No cranial nerve deficit.     Sensory: No sensory deficit.     Motor: No weakness.     Coordination: Coordination normal.     Gait: Gait normal.     Deep Tendon Reflexes: Reflexes normal.  Psychiatric:        Mood and Affect: Mood normal.        Behavior: Behavior normal.        Thought Content: Thought content normal.        Judgment: Judgment normal.     LABS:    Component Ref Range & Units 12/16/2022  WBC, automated 3.4 - 11.0 10*3/uL 7.6  RBC 3.70 - 5.50 10*6/uL 4.20  Hemoglobin 12.0 - 16.0 g/dL 16.1 Low   Hematocrit 09.6 - 48.0 % 35.1 Low   MCV 80.0 - 97.0 fL 83.6  MCH 27.0 - 33.0 pg 27.6  MCHC 30.0 - 35.0 g/dL 04.5  RDW 0.0 - 40.9 % 15.7 High   Platelets 150 - 400 10*3/uL 261   Component Ref Range & Units 12/16/2022   Troponin I <0.04 ng/mL 0.06 High Panic    Component Ref Range & Units 12/16/2022  Sodium 136 - 144 mmol/L 137  Potassium 3.5 - 5.0 mmol/L 4.0  Chloride 98 - 108 mmol/L 101  CO2 22 - 29 mmol/L 26  BUN 8 - 20 mg/dl 28 High   Creatinine 8.11 - 1.11 mg/dL 9.14  Calcium 8.7 - 78.2 mg/dl 95.6  Albumin (g/dl) In Ser/Plas 3.7 - 4.8 g/dL 4.1  Phosphorus 2.4 - 4.7 mg/dl 3.7  Glucose 70 - 213 mg/dL 086    STUDIES:      I,Jasmine M Lassiter,acting as a scribe for Dellia Beckwith, MD.,have documented all relevant documentation on the behalf of Dellia Beckwith, MD,as directed by  Dellia Beckwith, MD while in the presence of Dellia Beckwith, MD.

## 2022-12-22 ENCOUNTER — Telehealth: Payer: Self-pay | Admitting: Oncology

## 2022-12-22 NOTE — Telephone Encounter (Signed)
CT Chest w/o contrast & Neck/Head [place] have been scheduled for 12/25/22; Checking in @ 12:30 pm.    Notified pt of date,time and instructions.

## 2022-12-23 ENCOUNTER — Ambulatory Visit: Payer: Medicare PPO | Admitting: Oncology

## 2022-12-25 DIAGNOSIS — E041 Nontoxic single thyroid nodule: Secondary | ICD-10-CM | POA: Diagnosis not present

## 2022-12-25 DIAGNOSIS — I700 Atherosclerosis of aorta: Secondary | ICD-10-CM | POA: Diagnosis not present

## 2022-12-25 DIAGNOSIS — E042 Nontoxic multinodular goiter: Secondary | ICD-10-CM | POA: Diagnosis not present

## 2022-12-25 DIAGNOSIS — R911 Solitary pulmonary nodule: Secondary | ICD-10-CM | POA: Diagnosis not present

## 2022-12-25 DIAGNOSIS — R918 Other nonspecific abnormal finding of lung field: Secondary | ICD-10-CM | POA: Diagnosis not present

## 2022-12-29 DIAGNOSIS — R8271 Bacteriuria: Secondary | ICD-10-CM | POA: Diagnosis not present

## 2022-12-29 DIAGNOSIS — N3000 Acute cystitis without hematuria: Secondary | ICD-10-CM | POA: Diagnosis not present

## 2023-01-01 ENCOUNTER — Encounter: Payer: Self-pay | Admitting: Oncology

## 2023-01-05 ENCOUNTER — Telehealth: Payer: Self-pay | Admitting: Oncology

## 2023-01-05 ENCOUNTER — Telehealth: Payer: Self-pay

## 2023-01-05 NOTE — Telephone Encounter (Signed)
Contacted pt to update appt. Unable to reach via phone.   appt Received: Gaspar Skeeters, Gardiner Fanti, MD sent to Thomasena Edis; Otilio Miu; Mosher, Cameron Proud, PA-C She is another candidate for longterm survivorship clinic with [facility]. She is over 9 years out from her breast cancer but we are also monitoring her thyroid nodules.

## 2023-01-05 NOTE — Telephone Encounter (Signed)
Attempted to contact patient and daughter. No answer

## 2023-01-05 NOTE — Telephone Encounter (Signed)
-----   Message from Dellia Beckwith, MD sent at 01/04/2023  3:11 PM EDT ----- Regarding: call Tell her/daughter: the US of the thyroid is stable, and 1 of the nodules is smaller, they recommend repeat in 1-2 years.  The CT of the chest is negative for any nodules or worrisome findings.

## 2023-01-07 ENCOUNTER — Other Ambulatory Visit: Payer: Self-pay | Admitting: Cardiology

## 2023-01-07 ENCOUNTER — Telehealth: Payer: Self-pay

## 2023-01-07 NOTE — Telephone Encounter (Signed)
-----   Message from Christine H McCarty, MD sent at 01/04/2023  3:11 PM EDT ----- Regarding: call Tell her/daughter: the US of the thyroid is stable, and 1 of the nodules is smaller, they recommend repeat in 1-2 years.  The CT of the chest is negative for any nodules or worrisome findings.  

## 2023-01-07 NOTE — Telephone Encounter (Signed)
Patient notified of results.

## 2023-01-09 ENCOUNTER — Telehealth: Payer: Self-pay | Admitting: Oncology

## 2023-01-09 NOTE — Telephone Encounter (Signed)
Patient has been scheduled. Aware of appt date and time    appt Received: 5 days ago Dellia Beckwith, MD sent to Thomasena Edis; Otilio Miu; Mosher, Cameron Proud, PA-C She is another candidate for longterm survivorship clinic with [facility]. She is over 9 years out from her breast cancer but we are also monitoring her thyroid nodules.

## 2023-01-19 DIAGNOSIS — R8271 Bacteriuria: Secondary | ICD-10-CM | POA: Diagnosis not present

## 2023-01-19 DIAGNOSIS — N3020 Other chronic cystitis without hematuria: Secondary | ICD-10-CM | POA: Diagnosis not present

## 2023-01-30 DIAGNOSIS — R3121 Asymptomatic microscopic hematuria: Secondary | ICD-10-CM | POA: Diagnosis not present

## 2023-01-30 DIAGNOSIS — N3000 Acute cystitis without hematuria: Secondary | ICD-10-CM | POA: Diagnosis not present

## 2023-02-17 DIAGNOSIS — E538 Deficiency of other specified B group vitamins: Secondary | ICD-10-CM | POA: Diagnosis not present

## 2023-02-17 DIAGNOSIS — Z8679 Personal history of other diseases of the circulatory system: Secondary | ICD-10-CM | POA: Diagnosis not present

## 2023-02-17 DIAGNOSIS — E559 Vitamin D deficiency, unspecified: Secondary | ICD-10-CM | POA: Diagnosis not present

## 2023-02-17 DIAGNOSIS — I50810 Right heart failure, unspecified: Secondary | ICD-10-CM | POA: Diagnosis not present

## 2023-02-17 DIAGNOSIS — M1990 Unspecified osteoarthritis, unspecified site: Secondary | ICD-10-CM | POA: Diagnosis not present

## 2023-02-17 DIAGNOSIS — I4891 Unspecified atrial fibrillation: Secondary | ICD-10-CM | POA: Diagnosis not present

## 2023-02-17 DIAGNOSIS — D509 Iron deficiency anemia, unspecified: Secondary | ICD-10-CM | POA: Diagnosis not present

## 2023-02-17 DIAGNOSIS — E1169 Type 2 diabetes mellitus with other specified complication: Secondary | ICD-10-CM | POA: Diagnosis not present

## 2023-02-17 DIAGNOSIS — E785 Hyperlipidemia, unspecified: Secondary | ICD-10-CM | POA: Diagnosis not present

## 2023-02-17 DIAGNOSIS — I10 Essential (primary) hypertension: Secondary | ICD-10-CM | POA: Diagnosis not present

## 2023-02-18 DIAGNOSIS — R600 Localized edema: Secondary | ICD-10-CM | POA: Diagnosis not present

## 2023-02-18 DIAGNOSIS — M79662 Pain in left lower leg: Secondary | ICD-10-CM | POA: Diagnosis not present

## 2023-02-18 DIAGNOSIS — R296 Repeated falls: Secondary | ICD-10-CM | POA: Diagnosis not present

## 2023-02-26 ENCOUNTER — Other Ambulatory Visit: Payer: Self-pay | Admitting: Cardiology

## 2023-02-26 NOTE — Telephone Encounter (Signed)
Rx sent to pharmacy   

## 2023-03-06 DIAGNOSIS — N281 Cyst of kidney, acquired: Secondary | ICD-10-CM | POA: Diagnosis not present

## 2023-03-06 DIAGNOSIS — R3915 Urgency of urination: Secondary | ICD-10-CM | POA: Diagnosis not present

## 2023-03-06 DIAGNOSIS — R351 Nocturia: Secondary | ICD-10-CM | POA: Diagnosis not present

## 2023-03-06 DIAGNOSIS — R3121 Asymptomatic microscopic hematuria: Secondary | ICD-10-CM | POA: Diagnosis not present

## 2023-03-06 DIAGNOSIS — N3020 Other chronic cystitis without hematuria: Secondary | ICD-10-CM | POA: Diagnosis not present

## 2023-03-18 ENCOUNTER — Other Ambulatory Visit: Payer: Self-pay | Admitting: Cardiology

## 2023-03-18 DIAGNOSIS — I4819 Other persistent atrial fibrillation: Secondary | ICD-10-CM

## 2023-03-18 NOTE — Telephone Encounter (Signed)
Xarelto 20mg  refill request received. Pt is 88 years old, weight-81.1kg, Crea-0.95 on 12/16/22 , last seen by Dr. Dulce Sellar on 12/04/22, Diagnosis-Afib, CrCl- 65.21 mL/min; Dose is appropriate based on dosing criteria. Will send in refill to requested pharmacy.

## 2023-03-29 ENCOUNTER — Other Ambulatory Visit: Payer: Self-pay | Admitting: Cardiology

## 2023-05-04 DIAGNOSIS — S8012XA Contusion of left lower leg, initial encounter: Secondary | ICD-10-CM | POA: Diagnosis not present

## 2023-05-04 DIAGNOSIS — S40812A Abrasion of left upper arm, initial encounter: Secondary | ICD-10-CM | POA: Diagnosis not present

## 2023-05-10 DIAGNOSIS — E119 Type 2 diabetes mellitus without complications: Secondary | ICD-10-CM | POA: Diagnosis not present

## 2023-05-10 DIAGNOSIS — E7800 Pure hypercholesterolemia, unspecified: Secondary | ICD-10-CM | POA: Diagnosis not present

## 2023-05-10 DIAGNOSIS — S0990XA Unspecified injury of head, initial encounter: Secondary | ICD-10-CM | POA: Diagnosis not present

## 2023-05-10 DIAGNOSIS — I6781 Acute cerebrovascular insufficiency: Secondary | ICD-10-CM | POA: Diagnosis not present

## 2023-05-10 DIAGNOSIS — R2241 Localized swelling, mass and lump, right lower limb: Secondary | ICD-10-CM | POA: Diagnosis not present

## 2023-05-10 DIAGNOSIS — I10 Essential (primary) hypertension: Secondary | ICD-10-CM | POA: Diagnosis not present

## 2023-05-10 DIAGNOSIS — I6782 Cerebral ischemia: Secondary | ICD-10-CM | POA: Diagnosis not present

## 2023-05-10 DIAGNOSIS — R0781 Pleurodynia: Secondary | ICD-10-CM | POA: Diagnosis not present

## 2023-05-10 DIAGNOSIS — M542 Cervicalgia: Secondary | ICD-10-CM | POA: Diagnosis not present

## 2023-05-10 DIAGNOSIS — M79662 Pain in left lower leg: Secondary | ICD-10-CM | POA: Diagnosis not present

## 2023-05-10 DIAGNOSIS — S199XXA Unspecified injury of neck, initial encounter: Secondary | ICD-10-CM | POA: Diagnosis not present

## 2023-05-10 DIAGNOSIS — R296 Repeated falls: Secondary | ICD-10-CM | POA: Diagnosis not present

## 2023-05-10 DIAGNOSIS — R0789 Other chest pain: Secondary | ICD-10-CM | POA: Diagnosis not present

## 2023-05-10 DIAGNOSIS — R519 Headache, unspecified: Secondary | ICD-10-CM | POA: Diagnosis not present

## 2023-05-10 DIAGNOSIS — M7989 Other specified soft tissue disorders: Secondary | ICD-10-CM | POA: Diagnosis not present

## 2023-05-18 DIAGNOSIS — R1110 Vomiting, unspecified: Secondary | ICD-10-CM | POA: Diagnosis not present

## 2023-05-18 DIAGNOSIS — E119 Type 2 diabetes mellitus without complications: Secondary | ICD-10-CM | POA: Diagnosis not present

## 2023-05-18 DIAGNOSIS — R110 Nausea: Secondary | ICD-10-CM | POA: Diagnosis not present

## 2023-05-18 DIAGNOSIS — I482 Chronic atrial fibrillation, unspecified: Secondary | ICD-10-CM | POA: Diagnosis not present

## 2023-05-18 DIAGNOSIS — N2889 Other specified disorders of kidney and ureter: Secondary | ICD-10-CM | POA: Diagnosis not present

## 2023-05-18 DIAGNOSIS — N83291 Other ovarian cyst, right side: Secondary | ICD-10-CM | POA: Diagnosis not present

## 2023-05-18 DIAGNOSIS — J810 Acute pulmonary edema: Secondary | ICD-10-CM | POA: Diagnosis not present

## 2023-05-18 DIAGNOSIS — I700 Atherosclerosis of aorta: Secondary | ICD-10-CM | POA: Diagnosis not present

## 2023-05-18 DIAGNOSIS — R9389 Abnormal findings on diagnostic imaging of other specified body structures: Secondary | ICD-10-CM | POA: Diagnosis not present

## 2023-05-18 DIAGNOSIS — K7689 Other specified diseases of liver: Secondary | ICD-10-CM | POA: Diagnosis not present

## 2023-05-18 DIAGNOSIS — G2581 Restless legs syndrome: Secondary | ICD-10-CM | POA: Diagnosis not present

## 2023-05-18 DIAGNOSIS — R112 Nausea with vomiting, unspecified: Secondary | ICD-10-CM | POA: Diagnosis not present

## 2023-05-18 DIAGNOSIS — Z20822 Contact with and (suspected) exposure to covid-19: Secondary | ICD-10-CM | POA: Diagnosis not present

## 2023-05-18 DIAGNOSIS — N839 Noninflammatory disorder of ovary, fallopian tube and broad ligament, unspecified: Secondary | ICD-10-CM | POA: Diagnosis not present

## 2023-05-18 DIAGNOSIS — I10 Essential (primary) hypertension: Secondary | ICD-10-CM | POA: Diagnosis not present

## 2023-05-18 DIAGNOSIS — I110 Hypertensive heart disease with heart failure: Secondary | ICD-10-CM | POA: Diagnosis not present

## 2023-05-18 DIAGNOSIS — I5033 Acute on chronic diastolic (congestive) heart failure: Secondary | ICD-10-CM | POA: Diagnosis not present

## 2023-05-18 DIAGNOSIS — E785 Hyperlipidemia, unspecified: Secondary | ICD-10-CM | POA: Diagnosis not present

## 2023-05-18 DIAGNOSIS — E782 Mixed hyperlipidemia: Secondary | ICD-10-CM | POA: Diagnosis not present

## 2023-05-18 DIAGNOSIS — N281 Cyst of kidney, acquired: Secondary | ICD-10-CM | POA: Diagnosis not present

## 2023-05-18 DIAGNOSIS — D649 Anemia, unspecified: Secondary | ICD-10-CM | POA: Diagnosis not present

## 2023-05-18 DIAGNOSIS — I4819 Other persistent atrial fibrillation: Secondary | ICD-10-CM | POA: Diagnosis not present

## 2023-05-18 DIAGNOSIS — I517 Cardiomegaly: Secondary | ICD-10-CM | POA: Diagnosis not present

## 2023-05-18 DIAGNOSIS — F32A Depression, unspecified: Secondary | ICD-10-CM | POA: Diagnosis not present

## 2023-05-18 DIAGNOSIS — Z1231 Encounter for screening mammogram for malignant neoplasm of breast: Secondary | ICD-10-CM | POA: Diagnosis not present

## 2023-05-18 DIAGNOSIS — I4821 Permanent atrial fibrillation: Secondary | ICD-10-CM | POA: Diagnosis not present

## 2023-05-18 DIAGNOSIS — J811 Chronic pulmonary edema: Secondary | ICD-10-CM | POA: Diagnosis not present

## 2023-05-18 DIAGNOSIS — I4891 Unspecified atrial fibrillation: Secondary | ICD-10-CM | POA: Diagnosis not present

## 2023-05-18 DIAGNOSIS — R609 Edema, unspecified: Secondary | ICD-10-CM | POA: Diagnosis not present

## 2023-05-18 LAB — HM MAMMOGRAPHY

## 2023-05-19 DIAGNOSIS — I5033 Acute on chronic diastolic (congestive) heart failure: Secondary | ICD-10-CM | POA: Diagnosis not present

## 2023-05-19 DIAGNOSIS — I4819 Other persistent atrial fibrillation: Secondary | ICD-10-CM | POA: Diagnosis not present

## 2023-05-19 DIAGNOSIS — R1900 Intra-abdominal and pelvic swelling, mass and lump, unspecified site: Secondary | ICD-10-CM | POA: Diagnosis not present

## 2023-05-19 DIAGNOSIS — M79605 Pain in left leg: Secondary | ICD-10-CM | POA: Diagnosis not present

## 2023-05-19 DIAGNOSIS — E119 Type 2 diabetes mellitus without complications: Secondary | ICD-10-CM | POA: Diagnosis not present

## 2023-05-19 DIAGNOSIS — I272 Pulmonary hypertension, unspecified: Secondary | ICD-10-CM | POA: Diagnosis not present

## 2023-05-19 DIAGNOSIS — E782 Mixed hyperlipidemia: Secondary | ICD-10-CM | POA: Diagnosis not present

## 2023-05-19 DIAGNOSIS — R9389 Abnormal findings on diagnostic imaging of other specified body structures: Secondary | ICD-10-CM | POA: Diagnosis not present

## 2023-05-19 DIAGNOSIS — D649 Anemia, unspecified: Secondary | ICD-10-CM | POA: Diagnosis not present

## 2023-05-19 DIAGNOSIS — G2581 Restless legs syndrome: Secondary | ICD-10-CM | POA: Diagnosis not present

## 2023-05-19 DIAGNOSIS — I110 Hypertensive heart disease with heart failure: Secondary | ICD-10-CM | POA: Diagnosis not present

## 2023-05-19 DIAGNOSIS — F32A Depression, unspecified: Secondary | ICD-10-CM | POA: Diagnosis not present

## 2023-05-19 DIAGNOSIS — J810 Acute pulmonary edema: Secondary | ICD-10-CM | POA: Diagnosis not present

## 2023-05-19 DIAGNOSIS — N854 Malposition of uterus: Secondary | ICD-10-CM | POA: Diagnosis not present

## 2023-05-20 DIAGNOSIS — I5033 Acute on chronic diastolic (congestive) heart failure: Secondary | ICD-10-CM | POA: Diagnosis not present

## 2023-05-20 DIAGNOSIS — F32A Depression, unspecified: Secondary | ICD-10-CM | POA: Diagnosis not present

## 2023-05-20 DIAGNOSIS — I4819 Other persistent atrial fibrillation: Secondary | ICD-10-CM | POA: Diagnosis not present

## 2023-05-20 DIAGNOSIS — G2581 Restless legs syndrome: Secondary | ICD-10-CM | POA: Diagnosis not present

## 2023-05-20 DIAGNOSIS — J810 Acute pulmonary edema: Secondary | ICD-10-CM | POA: Diagnosis not present

## 2023-05-20 DIAGNOSIS — D649 Anemia, unspecified: Secondary | ICD-10-CM | POA: Diagnosis not present

## 2023-05-20 DIAGNOSIS — E782 Mixed hyperlipidemia: Secondary | ICD-10-CM | POA: Diagnosis not present

## 2023-05-20 DIAGNOSIS — E119 Type 2 diabetes mellitus without complications: Secondary | ICD-10-CM | POA: Diagnosis not present

## 2023-05-20 DIAGNOSIS — I110 Hypertensive heart disease with heart failure: Secondary | ICD-10-CM | POA: Diagnosis not present

## 2023-05-21 DIAGNOSIS — I4819 Other persistent atrial fibrillation: Secondary | ICD-10-CM | POA: Diagnosis not present

## 2023-05-21 DIAGNOSIS — F32A Depression, unspecified: Secondary | ICD-10-CM | POA: Diagnosis not present

## 2023-05-21 DIAGNOSIS — I5033 Acute on chronic diastolic (congestive) heart failure: Secondary | ICD-10-CM | POA: Diagnosis not present

## 2023-05-21 DIAGNOSIS — I110 Hypertensive heart disease with heart failure: Secondary | ICD-10-CM | POA: Diagnosis not present

## 2023-05-21 DIAGNOSIS — E119 Type 2 diabetes mellitus without complications: Secondary | ICD-10-CM | POA: Diagnosis not present

## 2023-05-21 DIAGNOSIS — D649 Anemia, unspecified: Secondary | ICD-10-CM | POA: Diagnosis not present

## 2023-05-21 DIAGNOSIS — E782 Mixed hyperlipidemia: Secondary | ICD-10-CM | POA: Diagnosis not present

## 2023-05-21 DIAGNOSIS — R9389 Abnormal findings on diagnostic imaging of other specified body structures: Secondary | ICD-10-CM | POA: Diagnosis not present

## 2023-05-21 DIAGNOSIS — G2581 Restless legs syndrome: Secondary | ICD-10-CM | POA: Diagnosis not present

## 2023-05-22 DIAGNOSIS — E119 Type 2 diabetes mellitus without complications: Secondary | ICD-10-CM | POA: Diagnosis not present

## 2023-05-22 DIAGNOSIS — D649 Anemia, unspecified: Secondary | ICD-10-CM | POA: Diagnosis not present

## 2023-05-22 DIAGNOSIS — E782 Mixed hyperlipidemia: Secondary | ICD-10-CM | POA: Diagnosis not present

## 2023-05-22 DIAGNOSIS — I110 Hypertensive heart disease with heart failure: Secondary | ICD-10-CM | POA: Diagnosis not present

## 2023-05-22 DIAGNOSIS — G2581 Restless legs syndrome: Secondary | ICD-10-CM | POA: Diagnosis not present

## 2023-05-22 DIAGNOSIS — I4819 Other persistent atrial fibrillation: Secondary | ICD-10-CM | POA: Diagnosis not present

## 2023-05-22 DIAGNOSIS — I5033 Acute on chronic diastolic (congestive) heart failure: Secondary | ICD-10-CM | POA: Diagnosis not present

## 2023-05-22 DIAGNOSIS — R9389 Abnormal findings on diagnostic imaging of other specified body structures: Secondary | ICD-10-CM | POA: Diagnosis not present

## 2023-05-22 DIAGNOSIS — F32A Depression, unspecified: Secondary | ICD-10-CM | POA: Diagnosis not present

## 2023-05-26 DIAGNOSIS — E782 Mixed hyperlipidemia: Secondary | ICD-10-CM | POA: Diagnosis not present

## 2023-05-26 DIAGNOSIS — I4819 Other persistent atrial fibrillation: Secondary | ICD-10-CM | POA: Diagnosis not present

## 2023-05-26 DIAGNOSIS — F32A Depression, unspecified: Secondary | ICD-10-CM | POA: Diagnosis not present

## 2023-05-26 DIAGNOSIS — R9389 Abnormal findings on diagnostic imaging of other specified body structures: Secondary | ICD-10-CM | POA: Diagnosis not present

## 2023-05-26 DIAGNOSIS — C50112 Malignant neoplasm of central portion of left female breast: Secondary | ICD-10-CM | POA: Diagnosis not present

## 2023-05-26 DIAGNOSIS — I110 Hypertensive heart disease with heart failure: Secondary | ICD-10-CM | POA: Diagnosis not present

## 2023-05-26 DIAGNOSIS — E119 Type 2 diabetes mellitus without complications: Secondary | ICD-10-CM | POA: Diagnosis not present

## 2023-05-26 DIAGNOSIS — Z170 Estrogen receptor positive status [ER+]: Secondary | ICD-10-CM | POA: Diagnosis not present

## 2023-05-26 DIAGNOSIS — D649 Anemia, unspecified: Secondary | ICD-10-CM | POA: Diagnosis not present

## 2023-05-26 DIAGNOSIS — I5033 Acute on chronic diastolic (congestive) heart failure: Secondary | ICD-10-CM | POA: Diagnosis not present

## 2023-05-26 DIAGNOSIS — G2581 Restless legs syndrome: Secondary | ICD-10-CM | POA: Diagnosis not present

## 2023-05-27 DIAGNOSIS — D649 Anemia, unspecified: Secondary | ICD-10-CM | POA: Diagnosis not present

## 2023-05-27 DIAGNOSIS — R9389 Abnormal findings on diagnostic imaging of other specified body structures: Secondary | ICD-10-CM | POA: Diagnosis not present

## 2023-05-27 DIAGNOSIS — E785 Hyperlipidemia, unspecified: Secondary | ICD-10-CM | POA: Diagnosis not present

## 2023-05-27 DIAGNOSIS — I10 Essential (primary) hypertension: Secondary | ICD-10-CM | POA: Diagnosis not present

## 2023-05-27 DIAGNOSIS — E119 Type 2 diabetes mellitus without complications: Secondary | ICD-10-CM | POA: Diagnosis not present

## 2023-05-27 DIAGNOSIS — I110 Hypertensive heart disease with heart failure: Secondary | ICD-10-CM | POA: Diagnosis not present

## 2023-05-27 DIAGNOSIS — I4819 Other persistent atrial fibrillation: Secondary | ICD-10-CM | POA: Diagnosis not present

## 2023-05-27 DIAGNOSIS — G2581 Restless legs syndrome: Secondary | ICD-10-CM | POA: Diagnosis not present

## 2023-05-27 DIAGNOSIS — Z8679 Personal history of other diseases of the circulatory system: Secondary | ICD-10-CM | POA: Diagnosis not present

## 2023-05-27 DIAGNOSIS — D509 Iron deficiency anemia, unspecified: Secondary | ICD-10-CM | POA: Diagnosis not present

## 2023-05-27 DIAGNOSIS — I5033 Acute on chronic diastolic (congestive) heart failure: Secondary | ICD-10-CM | POA: Diagnosis not present

## 2023-05-27 DIAGNOSIS — E782 Mixed hyperlipidemia: Secondary | ICD-10-CM | POA: Diagnosis not present

## 2023-05-27 DIAGNOSIS — F32A Depression, unspecified: Secondary | ICD-10-CM | POA: Diagnosis not present

## 2023-05-27 DIAGNOSIS — R110 Nausea: Secondary | ICD-10-CM | POA: Diagnosis not present

## 2023-05-27 DIAGNOSIS — J449 Chronic obstructive pulmonary disease, unspecified: Secondary | ICD-10-CM | POA: Diagnosis not present

## 2023-05-27 DIAGNOSIS — E559 Vitamin D deficiency, unspecified: Secondary | ICD-10-CM | POA: Diagnosis not present

## 2023-05-27 DIAGNOSIS — W19XXXA Unspecified fall, initial encounter: Secondary | ICD-10-CM | POA: Diagnosis not present

## 2023-05-27 DIAGNOSIS — F320 Major depressive disorder, single episode, mild: Secondary | ICD-10-CM | POA: Diagnosis not present

## 2023-05-27 DIAGNOSIS — E538 Deficiency of other specified B group vitamins: Secondary | ICD-10-CM | POA: Diagnosis not present

## 2023-05-27 DIAGNOSIS — E1169 Type 2 diabetes mellitus with other specified complication: Secondary | ICD-10-CM | POA: Diagnosis not present

## 2023-05-29 DIAGNOSIS — G2581 Restless legs syndrome: Secondary | ICD-10-CM | POA: Diagnosis not present

## 2023-05-29 DIAGNOSIS — D649 Anemia, unspecified: Secondary | ICD-10-CM | POA: Diagnosis not present

## 2023-05-29 DIAGNOSIS — F32A Depression, unspecified: Secondary | ICD-10-CM | POA: Diagnosis not present

## 2023-05-29 DIAGNOSIS — E782 Mixed hyperlipidemia: Secondary | ICD-10-CM | POA: Diagnosis not present

## 2023-05-29 DIAGNOSIS — I5033 Acute on chronic diastolic (congestive) heart failure: Secondary | ICD-10-CM | POA: Diagnosis not present

## 2023-05-29 DIAGNOSIS — E119 Type 2 diabetes mellitus without complications: Secondary | ICD-10-CM | POA: Diagnosis not present

## 2023-05-29 DIAGNOSIS — R9389 Abnormal findings on diagnostic imaging of other specified body structures: Secondary | ICD-10-CM | POA: Diagnosis not present

## 2023-05-29 DIAGNOSIS — I110 Hypertensive heart disease with heart failure: Secondary | ICD-10-CM | POA: Diagnosis not present

## 2023-05-29 DIAGNOSIS — I4819 Other persistent atrial fibrillation: Secondary | ICD-10-CM | POA: Diagnosis not present

## 2023-06-02 ENCOUNTER — Other Ambulatory Visit: Payer: Self-pay

## 2023-06-02 ENCOUNTER — Telehealth: Payer: Self-pay | Admitting: Cardiology

## 2023-06-02 DIAGNOSIS — D649 Anemia, unspecified: Secondary | ICD-10-CM | POA: Diagnosis not present

## 2023-06-02 DIAGNOSIS — I110 Hypertensive heart disease with heart failure: Secondary | ICD-10-CM | POA: Diagnosis not present

## 2023-06-02 DIAGNOSIS — E782 Mixed hyperlipidemia: Secondary | ICD-10-CM | POA: Diagnosis not present

## 2023-06-02 DIAGNOSIS — I4819 Other persistent atrial fibrillation: Secondary | ICD-10-CM | POA: Diagnosis not present

## 2023-06-02 DIAGNOSIS — I5033 Acute on chronic diastolic (congestive) heart failure: Secondary | ICD-10-CM | POA: Diagnosis not present

## 2023-06-02 DIAGNOSIS — F32A Depression, unspecified: Secondary | ICD-10-CM | POA: Diagnosis not present

## 2023-06-02 DIAGNOSIS — G2581 Restless legs syndrome: Secondary | ICD-10-CM | POA: Diagnosis not present

## 2023-06-02 DIAGNOSIS — M7989 Other specified soft tissue disorders: Secondary | ICD-10-CM

## 2023-06-02 DIAGNOSIS — R9389 Abnormal findings on diagnostic imaging of other specified body structures: Secondary | ICD-10-CM | POA: Diagnosis not present

## 2023-06-02 DIAGNOSIS — E119 Type 2 diabetes mellitus without complications: Secondary | ICD-10-CM | POA: Diagnosis not present

## 2023-06-02 NOTE — Telephone Encounter (Signed)
Left message for Lori Rodriguez to call back.

## 2023-06-02 NOTE — Telephone Encounter (Signed)
Bonnielee Haff returned RN Richard's call.

## 2023-06-02 NOTE — Telephone Encounter (Signed)
Marcelino Duster from [facility] returned our call and reported that the patient has had a weight gain of 5 lbs. In 2 days. Her beginning weight was 172 and today her weight is 177. Yesterday she took an extra Torsemide and instead of losing weight she gained an extra pound. She has +1 non pitting edema, lung are clear and she is not SOB more than normal. Today her blood pressure is 157/81 HR 86. Marcelino Duster was asking if she could take another extra torsemide or what were the next steps regarding the patient's increase in weight? Please advise

## 2023-06-02 NOTE — Telephone Encounter (Signed)
Called patient and informed her of Dr. Madireddy's recommendation below:  "To be from the chart appears torsemide dose is 20 mg once a day with extra doses on an as-needed basis.  Since she has gained 5 pounds and no significant improvement with an extra dose in the afternoon, lets escalate the torsemide dose. Have her take torsemide dose 40 mg twice daily for the next 2 days until Thursday morning and call back to report the weight change.  Have her get her basic metabolic panel done on Thursday."  Patient was agreeable with this plan and had no further questions at this time.

## 2023-06-03 DIAGNOSIS — G2581 Restless legs syndrome: Secondary | ICD-10-CM | POA: Diagnosis not present

## 2023-06-03 DIAGNOSIS — R9389 Abnormal findings on diagnostic imaging of other specified body structures: Secondary | ICD-10-CM | POA: Diagnosis not present

## 2023-06-03 DIAGNOSIS — E782 Mixed hyperlipidemia: Secondary | ICD-10-CM | POA: Diagnosis not present

## 2023-06-03 DIAGNOSIS — I5033 Acute on chronic diastolic (congestive) heart failure: Secondary | ICD-10-CM | POA: Diagnosis not present

## 2023-06-03 DIAGNOSIS — I4819 Other persistent atrial fibrillation: Secondary | ICD-10-CM | POA: Diagnosis not present

## 2023-06-03 DIAGNOSIS — E119 Type 2 diabetes mellitus without complications: Secondary | ICD-10-CM | POA: Diagnosis not present

## 2023-06-03 DIAGNOSIS — F32A Depression, unspecified: Secondary | ICD-10-CM | POA: Diagnosis not present

## 2023-06-03 DIAGNOSIS — D649 Anemia, unspecified: Secondary | ICD-10-CM | POA: Diagnosis not present

## 2023-06-03 DIAGNOSIS — I110 Hypertensive heart disease with heart failure: Secondary | ICD-10-CM | POA: Diagnosis not present

## 2023-06-04 DIAGNOSIS — M7989 Other specified soft tissue disorders: Secondary | ICD-10-CM | POA: Diagnosis not present

## 2023-06-05 DIAGNOSIS — E782 Mixed hyperlipidemia: Secondary | ICD-10-CM | POA: Diagnosis not present

## 2023-06-05 DIAGNOSIS — I110 Hypertensive heart disease with heart failure: Secondary | ICD-10-CM | POA: Diagnosis not present

## 2023-06-05 DIAGNOSIS — D649 Anemia, unspecified: Secondary | ICD-10-CM | POA: Diagnosis not present

## 2023-06-05 DIAGNOSIS — R9389 Abnormal findings on diagnostic imaging of other specified body structures: Secondary | ICD-10-CM | POA: Diagnosis not present

## 2023-06-05 DIAGNOSIS — E119 Type 2 diabetes mellitus without complications: Secondary | ICD-10-CM | POA: Diagnosis not present

## 2023-06-05 DIAGNOSIS — I4819 Other persistent atrial fibrillation: Secondary | ICD-10-CM | POA: Diagnosis not present

## 2023-06-05 DIAGNOSIS — F32A Depression, unspecified: Secondary | ICD-10-CM | POA: Diagnosis not present

## 2023-06-05 DIAGNOSIS — I5033 Acute on chronic diastolic (congestive) heart failure: Secondary | ICD-10-CM | POA: Diagnosis not present

## 2023-06-05 DIAGNOSIS — G2581 Restless legs syndrome: Secondary | ICD-10-CM | POA: Diagnosis not present

## 2023-06-05 LAB — BASIC METABOLIC PANEL
BUN/Creatinine Ratio: 19 (ref 12–28)
BUN: 20 mg/dL (ref 8–27)
CO2: 24 mmol/L (ref 20–29)
Calcium: 9.5 mg/dL (ref 8.7–10.3)
Chloride: 97 mmol/L (ref 96–106)
Creatinine, Ser: 1.08 mg/dL — ABNORMAL HIGH (ref 0.57–1.00)
Glucose: 129 mg/dL — ABNORMAL HIGH (ref 70–99)
Potassium: 4.3 mmol/L (ref 3.5–5.2)
Sodium: 138 mmol/L (ref 134–144)
eGFR: 50 mL/min/{1.73_m2} — ABNORMAL LOW (ref >59)

## 2023-06-09 DIAGNOSIS — I5033 Acute on chronic diastolic (congestive) heart failure: Secondary | ICD-10-CM | POA: Diagnosis not present

## 2023-06-09 DIAGNOSIS — I110 Hypertensive heart disease with heart failure: Secondary | ICD-10-CM | POA: Diagnosis not present

## 2023-06-09 DIAGNOSIS — G2581 Restless legs syndrome: Secondary | ICD-10-CM | POA: Diagnosis not present

## 2023-06-09 DIAGNOSIS — I4819 Other persistent atrial fibrillation: Secondary | ICD-10-CM | POA: Diagnosis not present

## 2023-06-09 DIAGNOSIS — E782 Mixed hyperlipidemia: Secondary | ICD-10-CM | POA: Diagnosis not present

## 2023-06-09 DIAGNOSIS — R9389 Abnormal findings on diagnostic imaging of other specified body structures: Secondary | ICD-10-CM | POA: Diagnosis not present

## 2023-06-09 DIAGNOSIS — F32A Depression, unspecified: Secondary | ICD-10-CM | POA: Diagnosis not present

## 2023-06-09 DIAGNOSIS — D649 Anemia, unspecified: Secondary | ICD-10-CM | POA: Diagnosis not present

## 2023-06-09 DIAGNOSIS — E119 Type 2 diabetes mellitus without complications: Secondary | ICD-10-CM | POA: Diagnosis not present

## 2023-06-10 DIAGNOSIS — R9389 Abnormal findings on diagnostic imaging of other specified body structures: Secondary | ICD-10-CM | POA: Diagnosis not present

## 2023-06-10 DIAGNOSIS — F32A Depression, unspecified: Secondary | ICD-10-CM | POA: Diagnosis not present

## 2023-06-10 DIAGNOSIS — E119 Type 2 diabetes mellitus without complications: Secondary | ICD-10-CM | POA: Diagnosis not present

## 2023-06-10 DIAGNOSIS — I4819 Other persistent atrial fibrillation: Secondary | ICD-10-CM | POA: Diagnosis not present

## 2023-06-10 DIAGNOSIS — I110 Hypertensive heart disease with heart failure: Secondary | ICD-10-CM | POA: Diagnosis not present

## 2023-06-10 DIAGNOSIS — D649 Anemia, unspecified: Secondary | ICD-10-CM | POA: Diagnosis not present

## 2023-06-10 DIAGNOSIS — E782 Mixed hyperlipidemia: Secondary | ICD-10-CM | POA: Diagnosis not present

## 2023-06-10 DIAGNOSIS — I5033 Acute on chronic diastolic (congestive) heart failure: Secondary | ICD-10-CM | POA: Diagnosis not present

## 2023-06-10 DIAGNOSIS — G2581 Restless legs syndrome: Secondary | ICD-10-CM | POA: Diagnosis not present

## 2023-06-12 DIAGNOSIS — E782 Mixed hyperlipidemia: Secondary | ICD-10-CM | POA: Diagnosis not present

## 2023-06-12 DIAGNOSIS — F32A Depression, unspecified: Secondary | ICD-10-CM | POA: Diagnosis not present

## 2023-06-12 DIAGNOSIS — R9389 Abnormal findings on diagnostic imaging of other specified body structures: Secondary | ICD-10-CM | POA: Diagnosis not present

## 2023-06-12 DIAGNOSIS — G2581 Restless legs syndrome: Secondary | ICD-10-CM | POA: Diagnosis not present

## 2023-06-12 DIAGNOSIS — I110 Hypertensive heart disease with heart failure: Secondary | ICD-10-CM | POA: Diagnosis not present

## 2023-06-12 DIAGNOSIS — I4819 Other persistent atrial fibrillation: Secondary | ICD-10-CM | POA: Diagnosis not present

## 2023-06-12 DIAGNOSIS — I5033 Acute on chronic diastolic (congestive) heart failure: Secondary | ICD-10-CM | POA: Diagnosis not present

## 2023-06-12 DIAGNOSIS — E119 Type 2 diabetes mellitus without complications: Secondary | ICD-10-CM | POA: Diagnosis not present

## 2023-06-12 DIAGNOSIS — D649 Anemia, unspecified: Secondary | ICD-10-CM | POA: Diagnosis not present

## 2023-06-15 DIAGNOSIS — E119 Type 2 diabetes mellitus without complications: Secondary | ICD-10-CM | POA: Diagnosis not present

## 2023-06-15 DIAGNOSIS — G2581 Restless legs syndrome: Secondary | ICD-10-CM | POA: Diagnosis not present

## 2023-06-15 DIAGNOSIS — F32A Depression, unspecified: Secondary | ICD-10-CM | POA: Diagnosis not present

## 2023-06-15 DIAGNOSIS — E782 Mixed hyperlipidemia: Secondary | ICD-10-CM | POA: Diagnosis not present

## 2023-06-15 DIAGNOSIS — D649 Anemia, unspecified: Secondary | ICD-10-CM | POA: Diagnosis not present

## 2023-06-15 DIAGNOSIS — I4819 Other persistent atrial fibrillation: Secondary | ICD-10-CM | POA: Diagnosis not present

## 2023-06-15 DIAGNOSIS — R9389 Abnormal findings on diagnostic imaging of other specified body structures: Secondary | ICD-10-CM | POA: Diagnosis not present

## 2023-06-15 DIAGNOSIS — I5033 Acute on chronic diastolic (congestive) heart failure: Secondary | ICD-10-CM | POA: Diagnosis not present

## 2023-06-15 DIAGNOSIS — I110 Hypertensive heart disease with heart failure: Secondary | ICD-10-CM | POA: Diagnosis not present

## 2023-06-23 DIAGNOSIS — I5033 Acute on chronic diastolic (congestive) heart failure: Secondary | ICD-10-CM | POA: Diagnosis not present

## 2023-06-23 DIAGNOSIS — D649 Anemia, unspecified: Secondary | ICD-10-CM | POA: Diagnosis not present

## 2023-06-23 DIAGNOSIS — I110 Hypertensive heart disease with heart failure: Secondary | ICD-10-CM | POA: Diagnosis not present

## 2023-06-23 DIAGNOSIS — F32A Depression, unspecified: Secondary | ICD-10-CM | POA: Diagnosis not present

## 2023-06-23 DIAGNOSIS — G2581 Restless legs syndrome: Secondary | ICD-10-CM | POA: Diagnosis not present

## 2023-06-23 DIAGNOSIS — E119 Type 2 diabetes mellitus without complications: Secondary | ICD-10-CM | POA: Diagnosis not present

## 2023-06-23 DIAGNOSIS — R9389 Abnormal findings on diagnostic imaging of other specified body structures: Secondary | ICD-10-CM | POA: Diagnosis not present

## 2023-06-23 DIAGNOSIS — I4819 Other persistent atrial fibrillation: Secondary | ICD-10-CM | POA: Diagnosis not present

## 2023-06-23 DIAGNOSIS — E782 Mixed hyperlipidemia: Secondary | ICD-10-CM | POA: Diagnosis not present

## 2023-06-29 ENCOUNTER — Other Ambulatory Visit: Payer: Self-pay

## 2023-07-02 DIAGNOSIS — F32A Depression, unspecified: Secondary | ICD-10-CM | POA: Diagnosis not present

## 2023-07-02 DIAGNOSIS — I110 Hypertensive heart disease with heart failure: Secondary | ICD-10-CM | POA: Diagnosis not present

## 2023-07-02 DIAGNOSIS — E782 Mixed hyperlipidemia: Secondary | ICD-10-CM | POA: Diagnosis not present

## 2023-07-02 DIAGNOSIS — I4819 Other persistent atrial fibrillation: Secondary | ICD-10-CM | POA: Diagnosis not present

## 2023-07-02 DIAGNOSIS — D649 Anemia, unspecified: Secondary | ICD-10-CM | POA: Diagnosis not present

## 2023-07-02 DIAGNOSIS — E119 Type 2 diabetes mellitus without complications: Secondary | ICD-10-CM | POA: Diagnosis not present

## 2023-07-02 DIAGNOSIS — G2581 Restless legs syndrome: Secondary | ICD-10-CM | POA: Diagnosis not present

## 2023-07-02 DIAGNOSIS — R9389 Abnormal findings on diagnostic imaging of other specified body structures: Secondary | ICD-10-CM | POA: Diagnosis not present

## 2023-07-02 DIAGNOSIS — I5033 Acute on chronic diastolic (congestive) heart failure: Secondary | ICD-10-CM | POA: Diagnosis not present

## 2023-07-08 ENCOUNTER — Other Ambulatory Visit: Payer: Self-pay

## 2023-07-08 ENCOUNTER — Telehealth: Payer: Self-pay | Admitting: Cardiology

## 2023-07-08 MED ORDER — TORSEMIDE 20 MG PO TABS: ORAL_TABLET | ORAL | 3 refills | Status: DC

## 2023-07-08 NOTE — Telephone Encounter (Signed)
Called patient and informed her that her Torsemide medication had been re-filled. Patient verbalized understanding and had no further questions at this time.

## 2023-07-08 NOTE — Telephone Encounter (Signed)
*  STAT* If patient is at the pharmacy, call can be transferred to refill team.   1. Which medications need to be refilled? (please list name of each medication and dose if known)   torsemide (DEMADEX) 20 MG tablet    2. Which pharmacy/location (including street and city if local pharmacy) is medication to be sent to? [facility] [place] [address]    3. Do they need a 30 day or 90 day supply? 90 day

## 2023-07-28 DIAGNOSIS — R9389 Abnormal findings on diagnostic imaging of other specified body structures: Secondary | ICD-10-CM | POA: Diagnosis not present

## 2023-07-28 DIAGNOSIS — N2889 Other specified disorders of kidney and ureter: Secondary | ICD-10-CM | POA: Diagnosis not present

## 2023-08-11 DIAGNOSIS — R9389 Abnormal findings on diagnostic imaging of other specified body structures: Secondary | ICD-10-CM | POA: Diagnosis not present

## 2023-09-01 DIAGNOSIS — Z23 Encounter for immunization: Secondary | ICD-10-CM | POA: Diagnosis not present

## 2023-09-01 DIAGNOSIS — I4891 Unspecified atrial fibrillation: Secondary | ICD-10-CM | POA: Diagnosis not present

## 2023-09-01 DIAGNOSIS — E785 Hyperlipidemia, unspecified: Secondary | ICD-10-CM | POA: Diagnosis not present

## 2023-09-01 DIAGNOSIS — M1990 Unspecified osteoarthritis, unspecified site: Secondary | ICD-10-CM | POA: Diagnosis not present

## 2023-09-01 DIAGNOSIS — F320 Major depressive disorder, single episode, mild: Secondary | ICD-10-CM | POA: Diagnosis not present

## 2023-09-01 DIAGNOSIS — Z9181 History of falling: Secondary | ICD-10-CM | POA: Diagnosis not present

## 2023-09-01 DIAGNOSIS — E1169 Type 2 diabetes mellitus with other specified complication: Secondary | ICD-10-CM | POA: Diagnosis not present

## 2023-09-01 DIAGNOSIS — E559 Vitamin D deficiency, unspecified: Secondary | ICD-10-CM | POA: Diagnosis not present

## 2023-09-01 DIAGNOSIS — I50810 Right heart failure, unspecified: Secondary | ICD-10-CM | POA: Diagnosis not present

## 2023-09-01 DIAGNOSIS — E538 Deficiency of other specified B group vitamins: Secondary | ICD-10-CM | POA: Diagnosis not present

## 2023-09-01 DIAGNOSIS — D509 Iron deficiency anemia, unspecified: Secondary | ICD-10-CM | POA: Diagnosis not present

## 2023-09-01 DIAGNOSIS — I10 Essential (primary) hypertension: Secondary | ICD-10-CM | POA: Diagnosis not present

## 2023-09-02 ENCOUNTER — Telehealth: Payer: Self-pay | Admitting: Cardiology

## 2023-09-02 DIAGNOSIS — I4819 Other persistent atrial fibrillation: Secondary | ICD-10-CM

## 2023-09-02 NOTE — Telephone Encounter (Signed)
Prescription refill request for Xarelto received.  Indication: AF Last office visit: 12/04/22  Caryl Pina MD Weight: 79.3kg Age: 88 Scr: 1.08 on 06/04/23  Epic CrCl: 46.81  Pt is taking Xarelto 20mg  daily.  Based on above findings Xarelto 15mg  daily would be the appropriate dose.  Message sent to PharmD Pool to decide on dose adjustment.

## 2023-09-02 NOTE — Telephone Encounter (Signed)
*  STAT* If patient is at the pharmacy, call can be transferred to refill team.   1. Which medications need to be refilled? (please list name of each medication and dose if known) rivaroxaban (XARELTO) 20 MG TABS tablet    2. Would you like to learn more about the convenience, safety, & potential cost savings by using the [facility]?      3. Are you open to using the [facility] (Type [facility]. ).   4. Which pharmacy/location (including street and city if local pharmacy) is medication to be sent to? [facility] [place] - [address]    5. Do they need a 30 day or 90 day supply? 10

## 2023-09-03 MED ORDER — RIVAROXABAN 20 MG PO TABS: 20.0000 mg | ORAL_TABLET | Freq: Every day | ORAL | 1 refills | Status: DC

## 2023-09-03 NOTE — Telephone Encounter (Signed)
Her baseline scr appears to be about 0.7-0.9 (which gives her a crcl >57). This last lab was after taking a few days of a higher torsemide dose. I would leave dose at 20mg  but will check with Dr. Dulce Sellar.

## 2023-09-03 NOTE — Telephone Encounter (Signed)
Rx for Xarelto sent to Pharmacy.

## 2023-09-04 DIAGNOSIS — S0990XA Unspecified injury of head, initial encounter: Secondary | ICD-10-CM | POA: Diagnosis not present

## 2023-09-07 DIAGNOSIS — S0990XA Unspecified injury of head, initial encounter: Secondary | ICD-10-CM | POA: Diagnosis not present

## 2023-09-11 DIAGNOSIS — R040 Epistaxis: Secondary | ICD-10-CM | POA: Diagnosis not present

## 2023-09-11 DIAGNOSIS — E119 Type 2 diabetes mellitus without complications: Secondary | ICD-10-CM | POA: Diagnosis not present

## 2023-09-11 DIAGNOSIS — Z7984 Long term (current) use of oral hypoglycemic drugs: Secondary | ICD-10-CM | POA: Diagnosis not present

## 2023-09-11 DIAGNOSIS — S0990XA Unspecified injury of head, initial encounter: Secondary | ICD-10-CM | POA: Diagnosis not present

## 2023-09-11 DIAGNOSIS — Z7901 Long term (current) use of anticoagulants: Secondary | ICD-10-CM | POA: Diagnosis not present

## 2023-09-11 DIAGNOSIS — E785 Hyperlipidemia, unspecified: Secondary | ICD-10-CM | POA: Diagnosis not present

## 2023-09-11 DIAGNOSIS — I10 Essential (primary) hypertension: Secondary | ICD-10-CM | POA: Diagnosis not present

## 2023-09-11 DIAGNOSIS — R58 Hemorrhage, not elsewhere classified: Secondary | ICD-10-CM | POA: Diagnosis not present

## 2023-09-12 DIAGNOSIS — Z4800 Encounter for change or removal of nonsurgical wound dressing: Secondary | ICD-10-CM | POA: Diagnosis not present

## 2023-09-17 DIAGNOSIS — Z7901 Long term (current) use of anticoagulants: Secondary | ICD-10-CM | POA: Diagnosis not present

## 2023-09-17 DIAGNOSIS — I129 Hypertensive chronic kidney disease with stage 1 through stage 4 chronic kidney disease, or unspecified chronic kidney disease: Secondary | ICD-10-CM | POA: Diagnosis not present

## 2023-09-17 DIAGNOSIS — F32A Depression, unspecified: Secondary | ICD-10-CM | POA: Diagnosis not present

## 2023-09-17 DIAGNOSIS — E1122 Type 2 diabetes mellitus with diabetic chronic kidney disease: Secondary | ICD-10-CM | POA: Diagnosis not present

## 2023-09-17 DIAGNOSIS — R040 Epistaxis: Secondary | ICD-10-CM | POA: Diagnosis not present

## 2023-09-17 DIAGNOSIS — R58 Hemorrhage, not elsewhere classified: Secondary | ICD-10-CM | POA: Diagnosis not present

## 2023-09-17 DIAGNOSIS — I4891 Unspecified atrial fibrillation: Secondary | ICD-10-CM | POA: Diagnosis not present

## 2023-09-17 DIAGNOSIS — I10 Essential (primary) hypertension: Secondary | ICD-10-CM | POA: Diagnosis not present

## 2023-09-17 DIAGNOSIS — N189 Chronic kidney disease, unspecified: Secondary | ICD-10-CM | POA: Diagnosis not present

## 2023-09-17 DIAGNOSIS — E785 Hyperlipidemia, unspecified: Secondary | ICD-10-CM | POA: Diagnosis not present

## 2023-09-17 DIAGNOSIS — Z7984 Long term (current) use of oral hypoglycemic drugs: Secondary | ICD-10-CM | POA: Diagnosis not present

## 2023-09-21 DIAGNOSIS — I728 Aneurysm of other specified arteries: Secondary | ICD-10-CM | POA: Diagnosis not present

## 2023-09-21 DIAGNOSIS — R9389 Abnormal findings on diagnostic imaging of other specified body structures: Secondary | ICD-10-CM | POA: Diagnosis not present

## 2023-09-21 DIAGNOSIS — R1032 Left lower quadrant pain: Secondary | ICD-10-CM | POA: Diagnosis not present

## 2023-09-22 DIAGNOSIS — R040 Epistaxis: Secondary | ICD-10-CM | POA: Diagnosis not present

## 2023-09-25 ENCOUNTER — Other Ambulatory Visit: Payer: Self-pay | Admitting: Cardiology

## 2023-09-27 DIAGNOSIS — I10 Essential (primary) hypertension: Secondary | ICD-10-CM | POA: Diagnosis not present

## 2023-09-27 DIAGNOSIS — Z79899 Other long term (current) drug therapy: Secondary | ICD-10-CM | POA: Diagnosis not present

## 2023-09-27 DIAGNOSIS — Z7984 Long term (current) use of oral hypoglycemic drugs: Secondary | ICD-10-CM | POA: Diagnosis not present

## 2023-09-27 DIAGNOSIS — R0902 Hypoxemia: Secondary | ICD-10-CM | POA: Diagnosis not present

## 2023-09-27 DIAGNOSIS — Z7901 Long term (current) use of anticoagulants: Secondary | ICD-10-CM | POA: Diagnosis not present

## 2023-09-27 DIAGNOSIS — E785 Hyperlipidemia, unspecified: Secondary | ICD-10-CM | POA: Diagnosis not present

## 2023-09-27 DIAGNOSIS — R040 Epistaxis: Secondary | ICD-10-CM | POA: Diagnosis not present

## 2023-09-27 DIAGNOSIS — E119 Type 2 diabetes mellitus without complications: Secondary | ICD-10-CM | POA: Diagnosis not present

## 2023-09-27 DIAGNOSIS — R000 Tachycardia, unspecified: Secondary | ICD-10-CM | POA: Diagnosis not present

## 2023-09-27 NOTE — ED Triage Notes (Signed)
 Patient arrives from home via EMS with complaints of epistaxis that began about 1 hour ago. Was seen here in recent past for the same. TXA 1g in gauze and 2 sprays per nostril of Afrin by EMS prior to arrival to ED.

## 2023-09-29 ENCOUNTER — Telehealth: Payer: Self-pay | Admitting: Cardiology

## 2023-09-29 ENCOUNTER — Other Ambulatory Visit: Payer: Self-pay

## 2023-09-29 ENCOUNTER — Telehealth: Payer: Self-pay

## 2023-09-29 DIAGNOSIS — R040 Epistaxis: Secondary | ICD-10-CM

## 2023-09-29 NOTE — Telephone Encounter (Signed)
 Pt c/o medication issue:  1. Name of Medication:   rivaroxaban  (XARELTO ) 20 MG TABS tablet    2. How are you currently taking this medication (dosage and times per day)? Take 1 tablet (20 mg total) by mouth daily.   3. Are you having a reaction (difficulty breathing--STAT)? No  4. What is your medication issue? Patient's daughter is calling because the patient has been having severe nose bleeds. Patient was suggested it maybe from the blood thinners. Patient's daughter stated the patient's BP has been running high and ranging around 170's/110's-130's. Patient stated she's been having headaches after her nose is bleeding. Please advise.

## 2023-09-29 NOTE — Telephone Encounter (Signed)
 Called the patient's daughter Asberry and she reported that the patient was having multiple nosebleeds and her blood pressure was running 170's/ 110 - 130's. Patient had to go to the ER 3 different times over the past few weeks with nosebleeds and high blood pressure. Spoke with Dr. Krasowski regarding this patient's symptoms and he recommended having her check her blood pressure twice daily for the next few days and then call the office with the results. He also recommended to put in a referral for Dr. Honor an ENT regarding the nosebleeds. I relayed this information to the patient's daughter and she verbalized understanding and had no further questions at this time.

## 2023-09-29 NOTE — Progress Notes (Signed)
 Marland Kitchen

## 2023-09-30 DIAGNOSIS — Z139 Encounter for screening, unspecified: Secondary | ICD-10-CM | POA: Diagnosis not present

## 2023-09-30 DIAGNOSIS — Z9181 History of falling: Secondary | ICD-10-CM | POA: Diagnosis not present

## 2023-09-30 DIAGNOSIS — Z0000 Encounter for general adult medical examination without abnormal findings: Secondary | ICD-10-CM | POA: Diagnosis not present

## 2023-09-30 NOTE — Telephone Encounter (Signed)
 Called the patient's daughter Asberry and she reported that the patient was having multiple nosebleeds and her blood pressure was running 170's/ 110 - 130's. Patient had to go to the ER 3 different times over the past few weeks with nosebleeds and high blood pressure. Spoke with Dr. Krasowski regarding this patient's symptoms and he recommended having her check her blood pressure twice daily for the next few days and then call the office with the results. He also recommended to put in a referral for Dr. Honor an ENT regarding the nosebleeds. I relayed this information to the patient's daughter and she verbalized understanding and had no further questions at this time.

## 2023-10-08 DIAGNOSIS — D490 Neoplasm of unspecified behavior of digestive system: Secondary | ICD-10-CM | POA: Diagnosis not present

## 2023-10-28 DIAGNOSIS — N2889 Other specified disorders of kidney and ureter: Secondary | ICD-10-CM | POA: Diagnosis not present

## 2023-11-06 DIAGNOSIS — J342 Deviated nasal septum: Secondary | ICD-10-CM | POA: Diagnosis not present

## 2023-11-06 DIAGNOSIS — R040 Epistaxis: Secondary | ICD-10-CM | POA: Diagnosis not present

## 2023-11-06 DIAGNOSIS — Z7901 Long term (current) use of anticoagulants: Secondary | ICD-10-CM | POA: Diagnosis not present

## 2023-11-06 DIAGNOSIS — I10 Essential (primary) hypertension: Secondary | ICD-10-CM | POA: Diagnosis not present

## 2023-11-14 ENCOUNTER — Other Ambulatory Visit: Payer: Self-pay | Admitting: Cardiology

## 2023-11-14 DIAGNOSIS — I4819 Other persistent atrial fibrillation: Secondary | ICD-10-CM

## 2023-11-16 NOTE — Telephone Encounter (Signed)
 Prescription refill request for Xarelto received.  Indication:afib Last office visit:3/24 Weight:81.1  kg Age:89 Scr:1.07  12/24 CrCl:48.32  ml/min  Under review

## 2023-11-20 MED ORDER — RIVAROXABAN 15 MG PO TABS: 15.0000 mg | ORAL_TABLET | Freq: Every day | ORAL | 1 refills | Status: DC

## 2023-11-20 NOTE — Addendum Note (Signed)
 Addended by: Sigurd Sos on: 11/20/2023 01:07 PM   Modules accepted: Orders

## 2023-12-03 DIAGNOSIS — F320 Major depressive disorder, single episode, mild: Secondary | ICD-10-CM | POA: Diagnosis not present

## 2023-12-03 DIAGNOSIS — D509 Iron deficiency anemia, unspecified: Secondary | ICD-10-CM | POA: Diagnosis not present

## 2023-12-03 DIAGNOSIS — I10 Essential (primary) hypertension: Secondary | ICD-10-CM | POA: Diagnosis not present

## 2023-12-03 DIAGNOSIS — M1990 Unspecified osteoarthritis, unspecified site: Secondary | ICD-10-CM | POA: Diagnosis not present

## 2023-12-03 DIAGNOSIS — I50810 Right heart failure, unspecified: Secondary | ICD-10-CM | POA: Diagnosis not present

## 2023-12-03 DIAGNOSIS — E1169 Type 2 diabetes mellitus with other specified complication: Secondary | ICD-10-CM | POA: Diagnosis not present

## 2023-12-03 DIAGNOSIS — E785 Hyperlipidemia, unspecified: Secondary | ICD-10-CM | POA: Diagnosis not present

## 2023-12-03 DIAGNOSIS — R300 Dysuria: Secondary | ICD-10-CM | POA: Diagnosis not present

## 2023-12-03 DIAGNOSIS — I4891 Unspecified atrial fibrillation: Secondary | ICD-10-CM | POA: Diagnosis not present

## 2023-12-15 DIAGNOSIS — N2889 Other specified disorders of kidney and ureter: Secondary | ICD-10-CM | POA: Diagnosis not present

## 2023-12-17 ENCOUNTER — Ambulatory Visit: Payer: Medicare PPO | Admitting: Oncology

## 2023-12-17 ENCOUNTER — Inpatient Hospital Stay: Payer: Medicare PPO | Attending: Hematology and Oncology | Admitting: Hematology and Oncology

## 2023-12-17 ENCOUNTER — Encounter: Payer: Self-pay | Admitting: Hematology and Oncology

## 2023-12-17 VITALS — BP 105/69 | HR 73 | Temp 97.7°F | Resp 20 | Ht 67.0 in | Wt 180.9 lb

## 2023-12-17 DIAGNOSIS — M7989 Other specified soft tissue disorders: Secondary | ICD-10-CM | POA: Insufficient documentation

## 2023-12-17 DIAGNOSIS — E042 Nontoxic multinodular goiter: Secondary | ICD-10-CM | POA: Diagnosis not present

## 2023-12-17 DIAGNOSIS — C50212 Malignant neoplasm of upper-inner quadrant of left female breast: Secondary | ICD-10-CM | POA: Diagnosis not present

## 2023-12-17 DIAGNOSIS — Z7901 Long term (current) use of anticoagulants: Secondary | ICD-10-CM | POA: Diagnosis not present

## 2023-12-17 DIAGNOSIS — R911 Solitary pulmonary nodule: Secondary | ICD-10-CM | POA: Diagnosis not present

## 2023-12-17 DIAGNOSIS — Z853 Personal history of malignant neoplasm of breast: Secondary | ICD-10-CM | POA: Diagnosis not present

## 2023-12-17 DIAGNOSIS — Z170 Estrogen receptor positive status [ER+]: Secondary | ICD-10-CM

## 2023-12-17 DIAGNOSIS — N2889 Other specified disorders of kidney and ureter: Secondary | ICD-10-CM | POA: Diagnosis not present

## 2023-12-17 HISTORY — DX: Nontoxic multinodular goiter: E04.2

## 2023-12-17 HISTORY — DX: Other specified soft tissue disorders: M79.89

## 2023-12-17 HISTORY — DX: Solitary pulmonary nodule: R91.1

## 2023-12-17 NOTE — Progress Notes (Signed)
 [facility]  [address] [place],  [place]  [address] [phone]  Clinic Day:  12/17/2023  Referring physician: Jim Like, NP  ASSESSMENT & PLAN:   Assessment & Plan: Malignant neoplasm of upper-inner quadrant of left female breast (HCC) Stage IA hormone receptor breast cancer diagnosed in February 2015.  She was treated with surgery and adjuvant hormonal therapy.  She completed 5 years of anastrozole in May 2020. She remains without evidence of recurrence. She will be due for mammogram and follow-up with Dr. Georgiana Shore in August.  We will plan to see her back in 1 year for repeat examination.  Multiple thyroid nodules We have been ordering her annual thyroid ultrasound. The last in April 2024 revealed a decreasing right superior thyroid nodule, considered benign, as well as a a stable isoechoic solid nodule in the right superior gland, follow-up ultrasound in 1 year was recommended.  She will be due for that in April, so I will order that.  Right renal mass Stable right kidney mass. She is being followed by Dr. Maryfrances Bunnell for questionable IPMN of the pancreas.  This was not not visualized in the second CT scan.  He plans to see her for follow-up in 6 months.  Pulmonary nodule Indeterminate irregular shaped 1.0 x 0.7 cm nodule in the right upper lung seen on CT cervical spine in March 2024.  This was evaluated with a CT chest in April 2024 which revealed stable groundglass opacity/mosaic attenuation in the lungs bilaterally, upper lobe predominant, nonspecific but suggestive of chronic interstitial lung disease, possibly chronic hypersensitivity pneumonitis.  No suspicious pulmonary nodules were seen.  Left leg swelling She reports swelling of the left leg for the past 2 days with mild calf tenderness.  She is on Xarelto for atrial fibrillation, but I still recommend an ultrasound of the left lower extremity.  Due to the late hour, we will plan to have this done as an  outpatient tomorrow to avoid sending her to the hospital.  She knows if she has worsening or develops other symptoms such as shortness of breath or chest pain overnight, she should go to the emergency room.    The patient understands the plans discussed today and is in agreement with them.  She knows to contact our office if she develops concerns prior to her next appointment.   I provided 40 minutes of face-to-face time during this encounter and > 50% was spent counseling as documented under my assessment and plan.    Adah Perl, PA-C  [facility] [place] - A DEPT OF [facility] [address] [place] [address] Dept: [phone] Dept Fax: [phone]   Orders Placed This Encounter  Procedures   US Venous Img Lower Unilateral Left (DVT)    Standing Status:   Future    Expected Date:   12/18/2023    Expiration Date:   12/16/2024    Reason for Exam (SYMPTOM  OR DIAGNOSIS REQUIRED):   left leg swelling    Preferred imaging location?:   [facility]   US Soft Tissue Head/Neck    Standing Status:   Future    Expected Date:   12/25/2023    Expiration Date:   12/16/2024    Reason for Exam (SYMPTOM  OR DIAGNOSIS REQUIRED):   follow up thyroid nodules    [facility]erred [facility] location[facility]       CHIEF COMPLAINT:  CC: History of  stage IA hormone receptor positive breast cancer  Current Treatment: Surveillance  HISTORY OF PRESENT ILLNESS:  Lori Rodriguez is a 89 y.o. female with a history of stage IA (T1c N0 M0) hormone receptor positive left breast cancer diagnosed in February 2015.  She was treated with lumpectomy.  Pathology revealed a 1.7 cm, grade 1, invasive ductal carcinoma in the background of low-grade ductal carcinoma in situ.  Four sentinel nodes were negative for metastasis.  Estrogen and progesterone receptors were positive and her 2 Neu negative.  Ki-67 was 13%.  She had multiple favorable  prognostic characteristics, so adjuvant chemotherapy was not recommended.  She declined adjuvant radiation therapy.  She was placed on anastrozole 1 mg daily in May 2015.  She was hospitalized in October 2017 with pneumonia.  CTA chest revealed interstitial edema versus infection, as well as areas of atelectasis and a right thyroid nodule, which was small and benign.  Bone density scan in June 2017 was normal.   She has remained without evidence of recurrent disease.  She had a right hip replacement in July 2017.  She underwent a left hip replacement in May 2018.   Bone density scan in March 2020 remained normal.  Since her bone density scan was normal even after nearly 5 years of anastrozole, we probably do not need to repeat this again.  She completed 5 years of anastrozole in May 2020.     Abdominal ultrasound in October 2019 was done due to abdominal pain and revealed a solid-appearing lesion of the right kidney.  She therefore underwent CT abdomen in November 2019 which revealed a 1.9 cm solid lesion of the right kidney, as well as a 1.2 cm solid lesion of the right kidney that had increased in size.  There was a 4.8 cm cyst of the left kidney which was a Bosniak 1.  She had seen Dr. Saddie Benders who referred her to Dr. Irish Lack to discuss possible cryoablation.  She was able to have the MRI abdomen in August 2020 which revealed 2 solid enhancing masses of the right kidney.  The lesion in the right mid kidney was essentially stable, measuring 2.1 x 2 cm.  The lesion in the right upper pole had minimally increased in size measuring, 1.6 x 1.3 cm, previously 1.5 x 1.2 cm.  She states that Dr. Fredia Sorrow did not recommend ablation at that time.  She has heart failure and atrial fibrillation and is followed by [facility].  She states she is back on diuretic and continues metoprolol and rivaroxaban.  She had a CT scan in June 2021, which did not even show definite renal masses, but they describe perinephric  stranding.  She did have a follow up thyroid ultrasound from March 2021 revealed no significant interval change in the size, appearance or characteristics of the 1.8 cm. Recommend 1 additional follow-up ultrasound in October 2022 to confirm 5 year stability and thus benignity.  Thyroid ultrasound in March 2023 revealed increase in 1 right superior thyroid solid nodule measuring 2.2 cm previously 1.6 cm in greatest dimension and a second measuring 1.6 cm, which was stable.  Follow-up in 1 year was recommended.  She had a a CT head and cervical spine in March 2024 at [facility] in [place] after a fall.   It was revealed that she had a hematoma in the back of her head and a irregular shaped 1.0 x 0.7 cm nodule in the right upper lung. They also noted that the  right thyroid is enlarged with numerous irregular thyroid nodules measuring up to 2.3 cm in diameter.  CT chest in April 2024 revealed stable groundglass opacity/mosaic attenuation in the lungs bilaterally, upper lung predominant.  No suspicious nodules.  At her visit in March 2024, she has not had a repeat thyroid ultrasound, so we ordered this.  Ultrasound in April 2024, revealed a decrease in the right superior thyroid nodule now measuring 1.6 cm in greatest dimension compared to 2.2 cm previously, stable for 5 years and considered benign.  There was a stable isoechoic solid nodule in the right superior gland measuring 1.6 cm in greatest dimension, follow-up ultrasound in 1 year was recommended.   Oncology History  Malignant neoplasm of upper-inner quadrant of left female breast (HCC)  11/06/2013 Cancer Staging   Staging form: Breast, AJCC 7th Edition - Clinical stage from 11/06/2013: Stage IA (T1c, N0, M0) - Signed by Dellia Beckwith, MD on 11/22/2020 Staged by: Managing physician Diagnostic confirmation: Positive histology Specimen type: Excision Histopathologic type: Infiltrating duct carcinoma, NOS Stage prefix: Initial  diagnosis Laterality: Left Tumor size (mm): 17 Method of lymph node assessment: Sentinel lymph node biopsy Histologic grade (G): G1 Lymph-vascular invasion (LVI): LVI not present (absent)/not identified Residual tumor (R): R0 - None Paget's disease: Negative Tumor grade (Scarff-Bloom-Richardson system): G1 Estrogen receptor status: Positive Progesterone receptor status: Positive HER2 status: Negative Multi-gene signature risk of recurrence: Not assessed Stage used in treatment planning: Yes National guidelines used in treatment planning: Yes Type of national guideline used in treatment planning: NCCN   03/10/2016 Initial Diagnosis   Cancer of central portion of left female breast (HCC)       INTERVAL HISTORY:  Lori Rodriguez is here today for repeat clinical assessment.  Overall, she has been doing fairly well.  She denies any changes in her breasts.  When pointed out, she states she has had swelling of her left leg since yesterday.  She has atrial fibrillation for which she is on Xarelto 15 mg daily.  She previously was on 20 mg daily but had recurrent severe nosebleeds requiring treatment in the emergency room, so the dose was lowered.  She reports chronic shortness of breath with exertion, which is unchanged.  She denies cough or hemoptysis  She denies fevers or chills. She denies pain. Her appetite is good. Her weight has been stable.  She continues to follow with Dr. Georgiana Shore and will see him in August with a bilateral screening mammogram.  She is being followed by Dr. Maryfrances Bunnell for possible pancreatic lesion.  CT abdomen and pelvis in December revealed a stable right renal mass concerning for malignancy.  The pancreatic lesion was no longer seen.  He plans to see her back in 6 months for repeat examination  REVIEW OF SYSTEMS:  Review of Systems  Constitutional:  Negative for appetite change, chills, diaphoresis, fatigue, fever and unexpected weight change.  HENT:   Positive for trouble  swallowing (pills, some solids). Negative for lump/mass, mouth sores and sore throat.   Respiratory:  Positive for shortness of breath (stable). Negative for cough.   Cardiovascular:  Positive for leg swelling (left leg). Negative for chest pain.  Gastrointestinal:  Negative for abdominal pain, constipation, diarrhea, nausea and vomiting.  Genitourinary:  Negative for difficulty urinating, dysuria, frequency, hematuria, vaginal bleeding and vaginal discharge.   Musculoskeletal:  Positive for gait problem (walks with cane). Negative for arthralgias, back pain and myalgias.  Skin:  Negative for rash.  Neurological:  Positive for gait problem (  walks with cane). Negative for dizziness and headaches.  Hematological:  Negative for adenopathy. Does not bruise/bleed easily.  Psychiatric/Behavioral:  Negative for depression and sleep disturbance. The patient is not nervous/anxious.      VITALS:  Blood pressure 105/69, pulse 73, temperature 97.7 F (36.5 C), temperature source Oral, resp. rate 20, height 5\' 7"  (1.702 m), weight 180 lb 14.4 oz (82.1 kg), SpO2 95%.  Wt Readings from Last 3 Encounters:  12/17/23 180 lb 14.4 oz (82.1 kg)  12/17/22 178 lb 11.2 oz (81.1 kg)  12/04/22 177 lb (80.3 kg)    Body mass index is 28.33 kg/m.  Performance status (ECOG): 1 - Symptomatic but completely ambulatory  PHYSICAL EXAM:  Physical Exam Vitals and nursing note reviewed.  Constitutional:      General: She is not in acute distress.    Appearance: Normal appearance.  HENT:     Head: Normocephalic and atraumatic.     Mouth/Throat:     Mouth: Mucous membranes are moist.     Pharynx: Oropharynx is clear. No oropharyngeal exudate or posterior oropharyngeal erythema.  Eyes:     General: No scleral icterus.    Extraocular Movements: Extraocular movements intact.     Conjunctiva/sclera: Conjunctivae normal.     Pupils: Pupils are equal, round, and reactive to light.  Cardiovascular:     Rate and Rhythm:  Normal rate and regular rhythm.     Heart sounds: Normal heart sounds. No murmur heard.    No friction rub. No gallop.  Pulmonary:     Effort: Pulmonary effort is normal.     Breath sounds: Normal breath sounds. No wheezing, rhonchi or rales.  Abdominal:     General: There is no distension.     Palpations: Abdomen is soft. There is no hepatomegaly, splenomegaly or mass.     Tenderness: There is no abdominal tenderness.  Musculoskeletal:        General: Normal range of motion.     Cervical back: Normal range of motion and neck supple. No tenderness.     Right lower leg: No edema.     Left lower leg: No edema.  Lymphadenopathy:     Cervical: No cervical adenopathy.     Upper Body:     Right upper body: No supraclavicular or axillary adenopathy.     Left upper body: No supraclavicular or axillary adenopathy.     Lower Body: No right inguinal adenopathy. Left inguinal adenopathy (few shoddy lymph nodes) present.  Skin:    General: Skin is warm and dry.     Coloration: Skin is not jaundiced.     Findings: No rash.  Neurological:     Mental Status: She is alert and oriented to person, place, and time.     Cranial Nerves: No cranial nerve deficit.  Psychiatric:        Mood and Affect: Mood normal.        Behavior: Behavior normal.        Thought Content: Thought content normal.     LABS:      Latest Ref Rng & Units 07/01/2021    2:37 PM 01/29/2021    3:55 AM 01/28/2021    3:25 AM  CBC  WBC 3.4 - 10.8 x10E3/uL 9.6   12.8   Hemoglobin 11.1 - 15.9 g/dL 16.1  8.3  7.7   Hematocrit 34.0 - 46.6 % 37.3  26.3  24.0   Platelets 150 - 450 x10E3/uL 255   285  Latest Ref Rng & Units 06/04/2023    4:23 PM 07/10/2021    1:38 PM 07/01/2021    2:37 PM  CMP  Glucose 70 - 99 mg/dL 161  096  58   BUN 8 - 27 mg/dL 20  20  15    Creatinine 0.57 - 1.00 mg/dL 0.45  4.09  8.11   Sodium 134 - 144 mmol/L 138  137  141   Potassium 3.5 - 5.2 mmol/L 4.3  4.3  4.1   Chloride 96 - 106 mmol/L 97   98  101   CO2 20 - 29 mmol/L 24  22  25    Calcium 8.7 - 10.3 mg/dL 9.5  8.8  9.7      STUDIES:    Exam(s): [id number] MAM/MAM DIGITAL TOMO SCREENING B  CLINICAL DATA: Screening.  EXAM:  DIGITAL SCREENING BILATERAL MAMMOGRAM WITH TOMOSYNTHESIS AND CAD  TECHNIQUE:  Bilateral screening digital craniocaudal and mediolateral oblique  mammograms were obtained. Bilateral screening digital breast  tomosynthesis was performed. The images were evaluated with  computer-aided detection.  COMPARISON: Previous exam(s).  ACR Breast Density Category b: There are scattered areas of  fibroglandular density.  FINDINGS:  There are no findings suspicious for malignancy.  IMPRESSION:  No mammographic evidence of malignancy. A result letter of this  screening mammogram will be mailed directly to the patient.  RECOMMENDATION:  Screening mammogram in one year. (Code:SM-B-01Y)  BI-RADS CATEGORY 1: Negative.    HISTORY:   Past Medical History:  Diagnosis Date   Anemia    Anginal pain (HCC)    Atrial fibrillation (HCC)    Cancer (HCC)    Left Breast Cancer   Cancer of central portion of left female breast (HCC) 03/10/2016   CHF (congestive heart failure) (HCC)    Chronic back pain    Chronic kidney disease    Right Renal Mass   Diabetes mellitus without complication (HCC)    DJD (degenerative joint disease)    Dysrhythmia    Atrial fibrillation   Encounter for preprocedural cardiovascular examination 02/28/2016   Formatting of this note might be different from the original. Overview:  Remote normal coronary arteriography and echo 2015 with normal EF% Formatting of this note might be different from the original. Remote normal coronary arteriography and echo 2015 with normal EF%   Estrogen receptor positive neoplasm 03/10/2016   Hypertension    Hypertensive heart disease 02/28/2016   Long term (current) use of anticoagulants 02/29/2016   Overview:  Overview:  apixaban Overview:  apixaban  Formatting  of this note might be different from the original. Overview:  apixaban Formatting of this note might be different from the original. apixaban   Mixed hyperlipidemia 02/29/2016   Orthostatic hypotension 12/01/2018   Osteoporosis    Persistent atrial fibrillation (HCC) 02/28/2016   Overview:  Overview:  CHADS2 vasc score= 4  Overview:  CHADS2 vasc score= 4 Overview:  CHADS2 vasc score= 4  Formatting of this note might be different from the original. Overview:  CHADS2 vasc score= 4  Overview:  CHADS2 vasc score= 4 Formatting of this note might be different from the original. CHADS2 vasc score= 4   Right renal mass    Secondary pulmonary arterial hypertension (HCC) 11/03/2018   Type 2 diabetes mellitus (HCC) 02/29/2016   Vitamin B12 deficiency    Vitamin D deficiency     Past Surgical History:  Procedure Laterality Date   BREAST LUMPECTOMY Left    CARDIAC CATHETERIZATION     CATARACT  EXTRACTION, BILATERAL     CHOLECYSTECTOMY     IR RADIOLOGIST EVAL & MGMT  09/29/2018   IR RADIOLOGIST EVAL & MGMT  05/12/2019   IR RADIOLOGIST EVAL & MGMT  05/22/2020   TOTAL HIP ARTHROPLASTY Bilateral    TOTAL HIP REVISION Right 01/24/2021   Procedure: TOTAL HIP REVISION;  Surgeon: Samson Frederic, MD;  Location: [facility];  Service: Orthopedics;  Laterality: Right;    Family History  Problem Relation Age of Onset   Heart attack Father    Heart attack Brother    Diabetes Brother    Myasthenia gravis Brother     Social History:  reports that she has never smoked. She has never been exposed to tobacco smoke. She has never used smokeless tobacco. She reports that she does not drink alcohol and does not use drugs.The patient is alone today.  Allergies:  Allergies  Allergen Reactions   Lisinopril Cough   Oxycodone    Percocet [Oxycodone-Acetaminophen] Other (See Comments)    Hallucinations   Valium [Diazepam] Other (See Comments)    Confusion   Prednisone Anxiety and Other (See Comments)     Dizziness, weakness      Current Medications: Current Outpatient Medications  Medication Sig Dispense Refill   tolterodine (DETROL LA) 4 MG 24 hr capsule Take 1 capsule by mouth every morning.     acetaminophen (TYLENOL) 325 MG tablet Take 1-2 tablets (325-650 mg total) by mouth every 6 (six) hours as needed for mild pain (pain score 1-3 or temp > 100.5). 100 tablet 0   acidophilus (RISAQUAD) CAPS capsule Take 1 capsule by mouth daily.     albuterol (PROVENTIL HFA;VENTOLIN HFA) 108 (90 Base) MCG/ACT inhaler Inhale 2 puffs into the lungs 4 (four) times daily as needed for wheezing or shortness of breath.     Calcium Carbonate-Vitamin D (CALCIUM-VITAMIN D3) 600-125 MG-UNIT TABS Take 1 capsule by mouth daily.     colchicine 0.6 MG tablet Take by mouth. (Patient not taking: Reported on 12/17/2023)     Cyanocobalamin (VITAMIN B-12) 5000 MCG TBDP Take 5,000 mcg by mouth daily. Take 2 tablets daily     diclofenac Sodium (VOLTAREN) 1 % GEL APPLY FOUR grams UP TO FOUR TIMES DAILY (Patient not taking: Reported on 12/17/2023)     ferrous sulfate 325 (65 FE) MG tablet Take 65 mg by mouth every other day.     fluticasone furoate-vilanterol (BREO ELLIPTA) 100-25 MCG/INH AEPB Inhale 1 puff into the lungs daily.     glipiZIDE (GLUCOTROL XL) 5 MG 24 hr tablet Take 5 mg by mouth daily.     glucosamine-chondroitin 500-400 MG tablet Take 1 tablet by mouth daily.     glucose blood (ACCU-CHEK GUIDE) test strip daily.     labetalol (NORMODYNE) 100 MG tablet TAKE ONE TABLET BY MOUTH ONCE DAILY 90 tablet 0   loperamide (IMODIUM A-D) 2 MG tablet Take 2 mg by mouth 4 (four) times daily as needed for diarrhea or loose stools.     metFORMIN (GLUCOPHAGE) 1000 MG tablet Take 1,000 mg by mouth 2 (two) times daily with a meal.     ondansetron (ZOFRAN) 4 MG tablet Take 4 mg by mouth every 8 (eight) hours as needed for nausea or vomiting.     OVER THE COUNTER MEDICATION Take 1 tablet by mouth daily. Pro-15 5 billion cfu 15 strains/     OVER THE  COUNTER MEDICATION Take 2 tablets by mouth at bedtime. Stool Softner/ Unknown strength  oxybutynin (DITROPAN) 5 MG tablet 5 mg 2 (two) times daily.     Rivaroxaban (XARELTO) 15 MG TABS tablet Take 1 tablet (15 mg total) by mouth daily. 90 tablet 1   rOPINIRole (REQUIP) 0.25 MG tablet Take by mouth.     rosuvastatin (CRESTOR) 40 MG tablet Take 40 mg by mouth daily.     sertraline (ZOLOFT) 100 MG tablet Take 100 mg by mouth daily.      torsemide (DEMADEX) 20 MG tablet Take one tablet by mouth daily in the morning. Take an extra tablet in the evening if you weigh 174 or greater. 90 tablet 3   traMADol (ULTRAM) 50 MG tablet Take 50 mg by mouth 2 (two) times daily.     TRUE METRIX BLOOD GLUCOSE TEST test strip SMARTSIG:Via Meter     Vitamin D, Ergocalciferol, (DRISDOL) 1.25 MG (50000 UNIT) CAPS capsule Take 50,000 Units by mouth every 7 (seven) days.     No current facility-administered medications for this visit.

## 2023-12-17 NOTE — Assessment & Plan Note (Signed)
 Stable right kidney mass. She is being followed by Dr. Maryfrances Bunnell for questionable IPMN of the pancreas.  This was not not visualized in the second CT scan.  He plans to see her for follow-up in 6 months.

## 2023-12-17 NOTE — Assessment & Plan Note (Addendum)
 She reports swelling of the left leg for the past 2 days with mild calf tenderness.  She is on Xarelto for atrial fibrillation, but I still recommend an ultrasound of the left lower extremity.  Due to the late hour, we will plan to have this done as an outpatient tomorrow to avoid sending her to the hospital.  She knows if she has worsening lower extremity swelling or shortness of breath or develops other chest pain overnight, she should go to the emergency room.

## 2023-12-17 NOTE — Assessment & Plan Note (Signed)
 Pulmonary nodule seen on

## 2023-12-17 NOTE — Assessment & Plan Note (Signed)
 We have been ordering her annual thyroid ultrasound. The last in April 2024 revealed a decreasing right superior thyroid nodule, considered benign, as well as a a stable isoechoic solid nodule in the right superior gland, follow-up ultrasound in 1 year was recommended.  She will be due for that in April, so I will order that.

## 2023-12-17 NOTE — Assessment & Plan Note (Signed)
Stage IA hormone receptor breast cancer diagnosed in February 2015.  She was treated with surgery and adjuvant hormonal therapy.  She completed 5 years of anastrozole in May 2020. She remains without evidence of recurrence. She will be due for mammogram and follow-up with Dr. Noberto Retort in August.  We will plan to see her back in 1 year for repeat examination. ?

## 2023-12-17 NOTE — Assessment & Plan Note (Signed)
 Indeterminate irregular shaped 1.0 x 0.7 cm nodule in the right upper lung seen on CT cervical spine in March 2024.  This was evaluated with a CT chest in April 2024 which revealed stable groundglass opacity/mosaic attenuation in the lungs bilaterally, upper lobe predominant, nonspecific but suggestive of chronic interstitial lung disease, possibly chronic hypersensitivity pneumonitis.  No suspicious pulmonary nodules were seen.

## 2023-12-21 ENCOUNTER — Ambulatory Visit (HOSPITAL_BASED_OUTPATIENT_CLINIC_OR_DEPARTMENT_OTHER)
Admission: RE | Admit: 2023-12-21 | Discharge: 2023-12-21 | Disposition: A | Discharge status: Home / Self Care | Source: Ambulatory Visit | Attending: Hematology and Oncology | Admitting: Hematology and Oncology

## 2023-12-21 ENCOUNTER — Ambulatory Visit (INDEPENDENT_AMBULATORY_CARE_PROVIDER_SITE_OTHER)
Admission: RE | Admit: 2023-12-21 | Discharge: 2023-12-21 | Disposition: A | Discharge status: Home / Self Care | Source: Ambulatory Visit | Attending: Hematology and Oncology | Admitting: Hematology and Oncology

## 2023-12-21 DIAGNOSIS — R600 Localized edema: Secondary | ICD-10-CM | POA: Diagnosis not present

## 2023-12-21 DIAGNOSIS — E042 Nontoxic multinodular goiter: Secondary | ICD-10-CM

## 2023-12-21 DIAGNOSIS — M7989 Other specified soft tissue disorders: Secondary | ICD-10-CM | POA: Diagnosis not present

## 2023-12-21 DIAGNOSIS — E049 Nontoxic goiter, unspecified: Secondary | ICD-10-CM

## 2023-12-22 ENCOUNTER — Other Ambulatory Visit (HOSPITAL_BASED_OUTPATIENT_CLINIC_OR_DEPARTMENT_OTHER): Admitting: Radiology

## 2023-12-24 ENCOUNTER — Telehealth: Payer: Self-pay

## 2023-12-24 NOTE — Telephone Encounter (Signed)
-----   Message from Adah Perl sent at 12/22/2023  7:42 AM EDT ----- Please let her know that ultrasound did not show a blood clot. We will see her back next year as scheduled. Thanks

## 2023-12-24 NOTE — Telephone Encounter (Signed)
 Patient advised of results and to follow up in a year.

## 2023-12-31 ENCOUNTER — Telehealth: Payer: Self-pay

## 2023-12-31 ENCOUNTER — Other Ambulatory Visit: Payer: Self-pay | Admitting: Hematology and Oncology

## 2023-12-31 DIAGNOSIS — E042 Nontoxic multinodular goiter: Secondary | ICD-10-CM

## 2023-12-31 NOTE — Telephone Encounter (Signed)
-----   Message from Adah Perl sent at 12/31/2023 12:29 PM EDT ----- Please let her know thyroid u/s was stable, but f/u in 1 year was recommended. Scheduling will call her for f/u appt here. Thanks

## 2023-12-31 NOTE — Telephone Encounter (Signed)
 Daughter notified and voiced understanding

## 2024-01-01 DIAGNOSIS — M109 Gout, unspecified: Secondary | ICD-10-CM | POA: Diagnosis not present

## 2024-01-01 DIAGNOSIS — I10 Essential (primary) hypertension: Secondary | ICD-10-CM | POA: Diagnosis not present

## 2024-01-01 DIAGNOSIS — E119 Type 2 diabetes mellitus without complications: Secondary | ICD-10-CM | POA: Diagnosis not present

## 2024-01-01 DIAGNOSIS — N2889 Other specified disorders of kidney and ureter: Secondary | ICD-10-CM | POA: Diagnosis not present

## 2024-01-01 DIAGNOSIS — S39011A Strain of muscle, fascia and tendon of abdomen, initial encounter: Secondary | ICD-10-CM | POA: Diagnosis not present

## 2024-01-01 DIAGNOSIS — E785 Hyperlipidemia, unspecified: Secondary | ICD-10-CM | POA: Diagnosis not present

## 2024-01-01 DIAGNOSIS — N281 Cyst of kidney, acquired: Secondary | ICD-10-CM | POA: Diagnosis not present

## 2024-01-01 DIAGNOSIS — C641 Malignant neoplasm of right kidney, except renal pelvis: Secondary | ICD-10-CM | POA: Diagnosis not present

## 2024-01-01 DIAGNOSIS — S76812A Strain of other specified muscles, fascia and tendons at thigh level, left thigh, initial encounter: Secondary | ICD-10-CM | POA: Diagnosis not present

## 2024-01-01 DIAGNOSIS — M1990 Unspecified osteoarthritis, unspecified site: Secondary | ICD-10-CM | POA: Diagnosis not present

## 2024-01-01 DIAGNOSIS — I728 Aneurysm of other specified arteries: Secondary | ICD-10-CM | POA: Diagnosis not present

## 2024-01-01 DIAGNOSIS — Z853 Personal history of malignant neoplasm of breast: Secondary | ICD-10-CM | POA: Diagnosis not present

## 2024-01-04 DIAGNOSIS — M79605 Pain in left leg: Secondary | ICD-10-CM | POA: Diagnosis not present

## 2024-01-04 DIAGNOSIS — Z6828 Body mass index (BMI) 28.0-28.9, adult: Secondary | ICD-10-CM | POA: Diagnosis not present

## 2024-01-06 DIAGNOSIS — E785 Hyperlipidemia, unspecified: Secondary | ICD-10-CM | POA: Diagnosis not present

## 2024-01-06 DIAGNOSIS — S8012XA Contusion of left lower leg, initial encounter: Secondary | ICD-10-CM | POA: Diagnosis not present

## 2024-01-06 DIAGNOSIS — E119 Type 2 diabetes mellitus without complications: Secondary | ICD-10-CM | POA: Diagnosis not present

## 2024-01-06 DIAGNOSIS — M109 Gout, unspecified: Secondary | ICD-10-CM | POA: Diagnosis not present

## 2024-01-06 DIAGNOSIS — I10 Essential (primary) hypertension: Secondary | ICD-10-CM | POA: Diagnosis not present

## 2024-01-06 DIAGNOSIS — S8011XA Contusion of right lower leg, initial encounter: Secondary | ICD-10-CM | POA: Diagnosis not present

## 2024-01-14 DIAGNOSIS — S8012XA Contusion of left lower leg, initial encounter: Secondary | ICD-10-CM | POA: Diagnosis not present

## 2024-01-14 DIAGNOSIS — M1712 Unilateral primary osteoarthritis, left knee: Secondary | ICD-10-CM | POA: Diagnosis not present

## 2024-01-14 DIAGNOSIS — M51369 Other intervertebral disc degeneration, lumbar region without mention of lumbar back pain or lower extremity pain: Secondary | ICD-10-CM | POA: Diagnosis not present

## 2024-01-20 DIAGNOSIS — R300 Dysuria: Secondary | ICD-10-CM | POA: Diagnosis not present

## 2024-01-20 DIAGNOSIS — N390 Urinary tract infection, site not specified: Secondary | ICD-10-CM | POA: Diagnosis not present

## 2024-01-28 DIAGNOSIS — M25552 Pain in left hip: Secondary | ICD-10-CM | POA: Diagnosis not present

## 2024-01-28 DIAGNOSIS — S76212A Strain of adductor muscle, fascia and tendon of left thigh, initial encounter: Secondary | ICD-10-CM | POA: Diagnosis not present

## 2024-01-28 DIAGNOSIS — M79605 Pain in left leg: Secondary | ICD-10-CM | POA: Diagnosis not present

## 2024-01-28 DIAGNOSIS — R2689 Other abnormalities of gait and mobility: Secondary | ICD-10-CM | POA: Diagnosis not present

## 2024-01-29 DIAGNOSIS — M1712 Unilateral primary osteoarthritis, left knee: Secondary | ICD-10-CM | POA: Diagnosis not present

## 2024-01-29 DIAGNOSIS — M7989 Other specified soft tissue disorders: Secondary | ICD-10-CM | POA: Diagnosis not present

## 2024-02-17 DIAGNOSIS — Z7984 Long term (current) use of oral hypoglycemic drugs: Secondary | ICD-10-CM | POA: Diagnosis not present

## 2024-02-17 DIAGNOSIS — E785 Hyperlipidemia, unspecified: Secondary | ICD-10-CM | POA: Diagnosis not present

## 2024-02-17 DIAGNOSIS — E119 Type 2 diabetes mellitus without complications: Secondary | ICD-10-CM | POA: Diagnosis not present

## 2024-02-17 DIAGNOSIS — Z7901 Long term (current) use of anticoagulants: Secondary | ICD-10-CM | POA: Diagnosis not present

## 2024-02-17 DIAGNOSIS — I10 Essential (primary) hypertension: Secondary | ICD-10-CM | POA: Diagnosis not present

## 2024-02-17 DIAGNOSIS — S0990XA Unspecified injury of head, initial encounter: Secondary | ICD-10-CM | POA: Diagnosis not present

## 2024-02-17 DIAGNOSIS — S0003XA Contusion of scalp, initial encounter: Secondary | ICD-10-CM | POA: Diagnosis not present

## 2024-02-17 DIAGNOSIS — Z853 Personal history of malignant neoplasm of breast: Secondary | ICD-10-CM | POA: Diagnosis not present

## 2024-02-24 DIAGNOSIS — R2689 Other abnormalities of gait and mobility: Secondary | ICD-10-CM | POA: Diagnosis not present

## 2024-02-24 DIAGNOSIS — S76212D Strain of adductor muscle, fascia and tendon of left thigh, subsequent encounter: Secondary | ICD-10-CM | POA: Diagnosis not present

## 2024-02-24 DIAGNOSIS — M25552 Pain in left hip: Secondary | ICD-10-CM | POA: Diagnosis not present

## 2024-02-24 DIAGNOSIS — M79605 Pain in left leg: Secondary | ICD-10-CM | POA: Diagnosis not present

## 2024-02-26 DIAGNOSIS — R2689 Other abnormalities of gait and mobility: Secondary | ICD-10-CM | POA: Diagnosis not present

## 2024-02-26 DIAGNOSIS — M25552 Pain in left hip: Secondary | ICD-10-CM | POA: Diagnosis not present

## 2024-02-26 DIAGNOSIS — S76212D Strain of adductor muscle, fascia and tendon of left thigh, subsequent encounter: Secondary | ICD-10-CM | POA: Diagnosis not present

## 2024-02-26 DIAGNOSIS — M79605 Pain in left leg: Secondary | ICD-10-CM | POA: Diagnosis not present

## 2024-03-01 DIAGNOSIS — R2689 Other abnormalities of gait and mobility: Secondary | ICD-10-CM | POA: Diagnosis not present

## 2024-03-01 DIAGNOSIS — S76212D Strain of adductor muscle, fascia and tendon of left thigh, subsequent encounter: Secondary | ICD-10-CM | POA: Diagnosis not present

## 2024-03-01 DIAGNOSIS — M79605 Pain in left leg: Secondary | ICD-10-CM | POA: Diagnosis not present

## 2024-03-01 DIAGNOSIS — M25552 Pain in left hip: Secondary | ICD-10-CM | POA: Diagnosis not present

## 2024-03-08 DIAGNOSIS — S76212D Strain of adductor muscle, fascia and tendon of left thigh, subsequent encounter: Secondary | ICD-10-CM | POA: Diagnosis not present

## 2024-03-08 DIAGNOSIS — R2689 Other abnormalities of gait and mobility: Secondary | ICD-10-CM | POA: Diagnosis not present

## 2024-03-08 DIAGNOSIS — M25552 Pain in left hip: Secondary | ICD-10-CM | POA: Diagnosis not present

## 2024-03-08 DIAGNOSIS — M79605 Pain in left leg: Secondary | ICD-10-CM | POA: Diagnosis not present

## 2024-03-09 DIAGNOSIS — R300 Dysuria: Secondary | ICD-10-CM | POA: Diagnosis not present

## 2024-03-10 DIAGNOSIS — M25552 Pain in left hip: Secondary | ICD-10-CM | POA: Diagnosis not present

## 2024-03-10 DIAGNOSIS — R2689 Other abnormalities of gait and mobility: Secondary | ICD-10-CM | POA: Diagnosis not present

## 2024-03-10 DIAGNOSIS — S76212D Strain of adductor muscle, fascia and tendon of left thigh, subsequent encounter: Secondary | ICD-10-CM | POA: Diagnosis not present

## 2024-03-10 DIAGNOSIS — M79605 Pain in left leg: Secondary | ICD-10-CM | POA: Diagnosis not present

## 2024-03-11 DIAGNOSIS — M7989 Other specified soft tissue disorders: Secondary | ICD-10-CM | POA: Diagnosis not present

## 2024-03-11 DIAGNOSIS — M1712 Unilateral primary osteoarthritis, left knee: Secondary | ICD-10-CM | POA: Diagnosis not present

## 2024-03-14 DIAGNOSIS — I4891 Unspecified atrial fibrillation: Secondary | ICD-10-CM | POA: Diagnosis not present

## 2024-03-14 DIAGNOSIS — E559 Vitamin D deficiency, unspecified: Secondary | ICD-10-CM | POA: Diagnosis not present

## 2024-03-14 DIAGNOSIS — I10 Essential (primary) hypertension: Secondary | ICD-10-CM | POA: Diagnosis not present

## 2024-03-14 DIAGNOSIS — E785 Hyperlipidemia, unspecified: Secondary | ICD-10-CM | POA: Diagnosis not present

## 2024-03-14 DIAGNOSIS — M1990 Unspecified osteoarthritis, unspecified site: Secondary | ICD-10-CM | POA: Diagnosis not present

## 2024-03-14 DIAGNOSIS — I50810 Right heart failure, unspecified: Secondary | ICD-10-CM | POA: Diagnosis not present

## 2024-03-14 DIAGNOSIS — E1169 Type 2 diabetes mellitus with other specified complication: Secondary | ICD-10-CM | POA: Diagnosis not present

## 2024-03-14 DIAGNOSIS — J449 Chronic obstructive pulmonary disease, unspecified: Secondary | ICD-10-CM | POA: Diagnosis not present

## 2024-03-14 DIAGNOSIS — E538 Deficiency of other specified B group vitamins: Secondary | ICD-10-CM | POA: Diagnosis not present

## 2024-03-14 DIAGNOSIS — G2581 Restless legs syndrome: Secondary | ICD-10-CM | POA: Diagnosis not present

## 2024-03-14 DIAGNOSIS — D509 Iron deficiency anemia, unspecified: Secondary | ICD-10-CM | POA: Diagnosis not present

## 2024-03-15 DIAGNOSIS — M79605 Pain in left leg: Secondary | ICD-10-CM | POA: Diagnosis not present

## 2024-03-15 DIAGNOSIS — S76212D Strain of adductor muscle, fascia and tendon of left thigh, subsequent encounter: Secondary | ICD-10-CM | POA: Diagnosis not present

## 2024-03-15 DIAGNOSIS — R2689 Other abnormalities of gait and mobility: Secondary | ICD-10-CM | POA: Diagnosis not present

## 2024-03-15 DIAGNOSIS — M25552 Pain in left hip: Secondary | ICD-10-CM | POA: Diagnosis not present

## 2024-03-17 DIAGNOSIS — R2689 Other abnormalities of gait and mobility: Secondary | ICD-10-CM | POA: Diagnosis not present

## 2024-03-17 DIAGNOSIS — M25552 Pain in left hip: Secondary | ICD-10-CM | POA: Diagnosis not present

## 2024-03-17 DIAGNOSIS — S76212D Strain of adductor muscle, fascia and tendon of left thigh, subsequent encounter: Secondary | ICD-10-CM | POA: Diagnosis not present

## 2024-03-17 DIAGNOSIS — M79605 Pain in left leg: Secondary | ICD-10-CM | POA: Diagnosis not present

## 2024-03-21 DIAGNOSIS — R2689 Other abnormalities of gait and mobility: Secondary | ICD-10-CM | POA: Diagnosis not present

## 2024-03-21 DIAGNOSIS — E875 Hyperkalemia: Secondary | ICD-10-CM | POA: Diagnosis not present

## 2024-03-21 DIAGNOSIS — M79605 Pain in left leg: Secondary | ICD-10-CM | POA: Diagnosis not present

## 2024-03-21 DIAGNOSIS — M25552 Pain in left hip: Secondary | ICD-10-CM | POA: Diagnosis not present

## 2024-03-21 DIAGNOSIS — S76212D Strain of adductor muscle, fascia and tendon of left thigh, subsequent encounter: Secondary | ICD-10-CM | POA: Diagnosis not present

## 2024-03-23 ENCOUNTER — Ambulatory Visit

## 2024-03-23 VITALS — BP 142/68 | HR 82 | Ht 67.0 in | Wt 177.0 lb

## 2024-03-23 DIAGNOSIS — I5032 Chronic diastolic (congestive) heart failure: Secondary | ICD-10-CM

## 2024-03-23 DIAGNOSIS — E782 Mixed hyperlipidemia: Secondary | ICD-10-CM

## 2024-03-23 DIAGNOSIS — I4819 Other persistent atrial fibrillation: Secondary | ICD-10-CM | POA: Diagnosis not present

## 2024-03-23 MED ORDER — RIVAROXABAN 20 MG PO TABS: 20.0000 mg | ORAL_TABLET | Freq: Every day | ORAL | 3 refills | Status: DC

## 2024-03-23 NOTE — Assessment & Plan Note (Signed)
 Rate controlled. Asymptomatic. Continue with current medication labetalol  100 mg once daily.  Elevated CHADS2 Vasc score . Mechanical fall risk Uses walker to ambulate.   Significant epistaxis in the past requiring lowering the dose of Xarelto . Renal function appears stable with GFR greater than 60, BUN 17, creatinine 0.8.  In the setting recommended adjusting the dose of Xarelto  up to 20 mg once daily for efficacy of stroke prophylaxis.  She is agreeable. Will send a new prescription.

## 2024-03-23 NOTE — Patient Instructions (Signed)
 Medication Instructions:  Your physician has recommended you make the following change in your medication:   Increase your Xarelto  to 20 mg daily  *If you need a refill on your cardiac medications before your next appointment, please call your pharmacy*   Lab Work: None ordered If you have labs (blood work) drawn today and your tests are completely normal, you will receive your results only by: MyChart Message (if you have MyChart) OR A paper copy in the mail If you have any lab test that is abnormal or we need to change your treatment, we will call you to review the results.   Testing/Procedures: None ordered   Follow-Up: At [facility], you and your health needs are our priority.  As part of our continuing mission to provide you with exceptional heart care, we have created designated Provider Care Teams.  These Care Teams include your primary Cardiologist (physician) and Advanced Practice Providers (APPs -  Physician Assistants and Nurse Practitioners) who all work together to provide you with the care you need, when you need it.  We recommend signing up for the patient portal called MyChart.  Sign up information is provided on this After Visit Summary.  MyChart is used to connect with patients for Virtual Visits (Telemedicine).  Patients are able to view lab/test results, encounter notes, upcoming appointments, etc.  Non-urgent messages can be sent to your provider as well.   To learn more about what you can do with MyChart, go to [email or web address].    Your next appointment:   6 month(s)  The format for your next appointment:   In Person  Provider:   Alean Kobus, MD    Other Instructions none  Important Information About Sugar

## 2024-03-23 NOTE — Assessment & Plan Note (Signed)
 Appears compensated, euvolemic. Functional status limited due to age, deconditioning, uses a walker to ambulate.  NYHA class II.    Continue with salt restriction to below 2 g/day and fluid restriction to below 2 L/day. Continue with water  pill torsemide  20 mg once daily. Advised to continue to monitor her weight regularly which she does and has been steady around 177 pounds.  Continue with labetalol  100 mg once daily. Titrating up medications limited due to orthostatic blood pressure changes and mechanical fall risk

## 2024-03-23 NOTE — Assessment & Plan Note (Signed)
 Recent lipid panel with total cholesterol 116, HDL 40, LDL 57, triglycerides 104. Continue with rosuvastatin 40 mg once daily

## 2024-03-23 NOTE — Progress Notes (Signed)
 Cardiology Consultation:    Date:  03/23/2024   ID:  Lori Rodriguez, DOB 01-22-1935, MRN [id number]  PCP:  Pandora Therisa RAMAN, NP  Cardiologist:  Alean SAUNDERS Mariea Mcmartin, MD   Referring MD: Pandora Therisa RAMAN, NP   No chief complaint on file.    ASSESSMENT AND PLAN:   Lori Rodriguez 89 year old woman  history of permanent atrial fibrillation, hypertension, chronic CHF with preserved EF, anemia, orthostatic hypotension, last echocardiogram May 2022 with LVEF 55 to 60%, moderately elevated RVSP, left atrium dilated, hyperlipidemia, diabetes mellitus.  Mentions has been on low-dose Xarelto  given history of epistaxis but this is resolved since she had visit with ENT and cauterization of the suspicious lesion was done   Here for routine follow-up visit. Problem List Items Addressed This Visit     Permanent atrial fibrillation (HCC) - Primary   Rate controlled. Asymptomatic. Continue with current medication labetalol  100 mg once daily.  Elevated CHADS2 Vasc score . Mechanical fall risk Uses walker to ambulate.   Significant epistaxis in the past requiring lowering the dose of Xarelto . Renal function appears stable with GFR greater than 60, BUN 17, creatinine 0.8.  In the setting recommended adjusting the dose of Xarelto  up to 20 mg once daily for efficacy of stroke prophylaxis.  She is agreeable. Will send a new prescription.       Relevant Medications   rivaroxaban  (XARELTO ) 20 MG TABS tablet   Mixed hyperlipidemia   Recent lipid panel with total cholesterol 116, HDL 40, LDL 57, triglycerides 104. Continue with rosuvastatin 40 mg once daily      Relevant Medications   rivaroxaban  (XARELTO ) 20 MG TABS tablet   CHF (congestive heart failure) (HCC)   Appears compensated, euvolemic. Functional status limited due to age, deconditioning, uses a walker to ambulate.  NYHA class II.    Continue with salt restriction to below 2 g/day and fluid restriction to below 2 L/day. Continue  with water  pill torsemide  20 mg once daily. Advised to continue to monitor her weight regularly which she does and has been steady around 177 pounds.  Continue with labetalol  100 mg once daily. Titrating up medications limited due to orthostatic blood pressure changes and mechanical fall risk       Relevant Medications   rivaroxaban  (XARELTO ) 20 MG TABS tablet   Return to clinic tentatively in 6 months.   History of Present Illness:    Lori Rodriguez is a 89 y.o. female who is being seen today for follow-up visit. PCP is Pandora Therisa RAMAN, NP Last visit with us  in the office was 12/04/2022 with Dr. Monetta.  Seen today for follow-up visit, accompanied by her granddaughter. At home lives with her daughter.  Has history of permanent atrial fibrillation, hypertension, chronic CHF with preserved EF, anemia, orthostatic hypotension, last echocardiogram May 2022 with LVEF 55 to 60%, moderately elevated RVSP, left atrium dilated, hyperlipidemia, diabetes mellitus.  Mentions has been on low-dose Xarelto  given history of epistaxis but this is resolved since she had visit with ENT and cauterization of the suspicious lesion was done.  Denies any symptoms of chest pain. Mentions she does get tired and short of breath with exertion but this has not changed over the last couple years. Does report mechanical falls but no syncopal or near syncopal episodes in over 3 years. Uses a walker to ambulate.  Keeps up with day-to-day activities and does dishwashing while taking breaks.  Denies any palpitations, lightheadedness, dizziness. No significant change  in her weight has been around 177 pounds. At times notices trace ankle edema in the left lower extremity. No calf tenderness.  Good compliance with her medications.  Continues to take torsemide  20 mg once daily. Tells me she is almost out of labetalol  and requesting refill.  EKG in the clinic today shows atrial fibrillation, ventricular rate  82/min, incomplete RBBB  Blood work from 6/23-20 25 at PCPs office identified on KPN noted BUN 17, creatinine 0.8 and EGFR 72. Potassium 5.1 Hemoglobin 11.9 Hemoglobin A1c 8.1. Thyroid  panel TSH 2.81 12-03-2023. Total cholesterol 116, HDL 40, LDL 57, triglycerides 104.   Past Medical History:  Diagnosis Date   Anemia    Anginal pain (HCC)    Atrial fibrillation (HCC)    Cancer (HCC)    Left Breast Cancer   Cancer of central portion of left female breast (HCC) 03/10/2016   CHF (congestive heart failure) (HCC)    Chronic back pain    Chronic kidney disease    Right Renal Mass   Diabetes mellitus without complication (HCC)    DJD (degenerative joint disease)    Dysrhythmia    Atrial fibrillation   Encounter for preprocedural cardiovascular examination 02/28/2016   Formatting of this note might be different from the original. Overview:  Remote normal coronary arteriography and echo 2015 with normal EF% Formatting of this note might be different from the original. Remote normal coronary arteriography and echo 2015 with normal EF%   Estrogen receptor positive neoplasm 03/10/2016   Hypertension    Hypertensive heart disease 02/28/2016   Long term (current) use of anticoagulants 02/29/2016   Overview:  Overview:  apixaban Overview:  apixaban  Formatting of this note might be different from the original. Overview:  apixaban Formatting of this note might be different from the original. apixaban   Mixed hyperlipidemia 02/29/2016   Orthostatic hypotension 12/01/2018   Osteoporosis    Persistent atrial fibrillation (HCC) 02/28/2016   Overview:  Overview:  CHADS2 vasc score= 4  Overview:  CHADS2 vasc score= 4 Overview:  CHADS2 vasc score= 4  Formatting of this note might be different from the original. Overview:  CHADS2 vasc score= 4  Overview:  CHADS2 vasc score= 4 Formatting of this note might be different from the original. CHADS2 vasc score= 4   Right renal mass    Secondary pulmonary arterial  hypertension (HCC) 11/03/2018   Type 2 diabetes mellitus (HCC) 02/29/2016   Vitamin B12 deficiency    Vitamin D deficiency     Past Surgical History:  Procedure Laterality Date   BREAST LUMPECTOMY Left    CARDIAC CATHETERIZATION     CATARACT EXTRACTION, BILATERAL     CHOLECYSTECTOMY     IR RADIOLOGIST EVAL & MGMT  09/29/2018   IR RADIOLOGIST EVAL & MGMT  05/12/2019   IR RADIOLOGIST EVAL & MGMT  05/22/2020   TOTAL HIP ARTHROPLASTY Bilateral    TOTAL HIP REVISION Right 01/24/2021   Procedure: TOTAL HIP REVISION;  Surgeon: Fidel Rogue, MD;  Location: [facility];  Service: Orthopedics;  Laterality: Right;    Current Medications: Current Meds  Medication Sig   acetaminophen  (TYLENOL ) 325 MG tablet Take 1-2 tablets (325-650 mg total) by mouth every 6 (six) hours as needed for mild pain (pain score 1-3 or temp > 100.5).   acidophilus (RISAQUAD) CAPS capsule Take 1 capsule by mouth daily.   albuterol  (PROVENTIL  HFA;VENTOLIN  HFA) 108 (90 Base) MCG/ACT inhaler Inhale 2 puffs into the lungs 4 (four) times daily as needed  for wheezing or shortness of breath.   Calcium Carbonate-Vitamin D (CALCIUM-VITAMIN D3) 600-125 MG-UNIT TABS Take 1 capsule by mouth daily.   colchicine 0.6 MG tablet Take by mouth. (Patient taking differently: Take by mouth as needed (gout).)   Cyanocobalamin  (VITAMIN B-12) 5000 MCG TBDP Take 5,000 mcg by mouth daily. Take 2 tablets daily   ferrous sulfate 325 (65 FE) MG tablet Take 65 mg by mouth every other day.   fluticasone furoate-vilanterol (BREO ELLIPTA) 100-25 MCG/INH AEPB Inhale 1 puff into the lungs daily.   glipiZIDE (GLUCOTROL XL) 5 MG 24 hr tablet Take 5 mg by mouth daily.   glucosamine-chondroitin 500-400 MG tablet Take 1 tablet by mouth daily.   glucose blood (ACCU-CHEK GUIDE) test strip daily.   labetalol  (NORMODYNE ) 100 MG tablet TAKE ONE TABLET BY MOUTH ONCE DAILY   loperamide (IMODIUM A-D) 2 MG tablet Take 2 mg by mouth 4 (four) times daily as needed for diarrhea  or loose stools.   metFORMIN (GLUCOPHAGE) 1000 MG tablet Take 1,000 mg by mouth 2 (two) times daily with a meal.   ondansetron  (ZOFRAN ) 4 MG tablet Take 4 mg by mouth every 8 (eight) hours as needed for nausea or vomiting.   OVER THE COUNTER MEDICATION Take 1 tablet by mouth daily. Pro-15 5 billion cfu 15 strains/   OVER THE COUNTER MEDICATION Take 2 tablets by mouth at bedtime. Stool Softner/ Unknown strength   oxybutynin (DITROPAN) 5 MG tablet 5 mg 2 (two) times daily.   rOPINIRole (REQUIP) 0.25 MG tablet Take by mouth.   rosuvastatin (CRESTOR) 40 MG tablet Take 40 mg by mouth daily.   sertraline  (ZOLOFT ) 100 MG tablet Take 100 mg by mouth daily.    tolterodine (DETROL LA) 4 MG 24 hr capsule Take 1 capsule by mouth every morning.   torsemide  (DEMADEX ) 20 MG tablet Take one tablet by mouth daily in the morning. Take an extra tablet in the evening if you weigh 174 or greater.   traMADol (ULTRAM) 50 MG tablet Take 50 mg by mouth 2 (two) times daily.   TRUE METRIX BLOOD GLUCOSE TEST test strip SMARTSIG:Via Meter   Vitamin D, Ergocalciferol, (DRISDOL) 1.25 MG (50000 UNIT) CAPS capsule Take 50,000 Units by mouth every 7 (seven) days.   [DISCONTINUED] Rivaroxaban  (XARELTO ) 15 MG TABS tablet Take 1 tablet (15 mg total) by mouth daily.     Allergies:   Lisinopril, Oxycodone, Percocet [oxycodone-acetaminophen ], Valium [diazepam], and Prednisone   Social History   Socioeconomic History   Marital status: Widowed    Spouse name: Not on file   Number of children: Not on file   Years of education: Not on file   Highest education level: Not on file  Occupational History   Not on file  Tobacco Use   Smoking status: Never    Passive exposure: Never   Smokeless tobacco: Never  Vaping Use   Vaping status: Never Used  Substance and Sexual Activity   Alcohol use: Never   Drug use: Never   Sexual activity: Not on file  Other Topics Concern   Not on file  Social History Narrative   Not on file    Social Drivers of Health   Financial Resource Strain: Low Risk  (05/18/2023)   Received from [facility] of the [employer] Strain (CARDIA)    Difficulty of Paying Living Expenses: Not hard at all  Food Insecurity: No Food Insecurity (05/18/2023)   Received from [place] of the [facility]  Hunger Vital Sign    Within the past 12 months, you worried that your food would run out before you got the money to buy more.: Never true    Within the past 12 months, the food you bought just didn't last and you didn't have money to get more.: Never true  Transportation Needs: No Transportation Needs (07/02/2023)   Received from [place] of the [place]   OASIS A1250: Administrator, Civil Service (Medical): No    Lack of Transportation (Non-Medical): No    Patient Unable or Declines to Respond: No  Physical Activity: Not on file  Stress: Not on file  Social Connections: Feeling Socially Integrated (07/02/2023)   Received from [place] of the [place]   OASIS D0700: Social Isolation    Frequency of experiencing loneliness or isolation: Never     Family History: The patient's family history includes Diabetes in her brother; Heart attack in her brother and father; Myasthenia gravis in her brother. ROS:   Please see the history of present illness.    All 14 point review of systems negative except as described per history of present illness.  EKGs/Labs/Other Studies Reviewed:    The following studies were reviewed today:   EKG:  EKG Interpretation Date/Time:  Wednesday March 23 2024 11:59:27 EDT Ventricular Rate:  82 PR Interval:    QRS Duration:  94 QT Interval:  382 QTC Calculation: 446 R Axis:   132  Text Interpretation: Atrial fibrillation Right axis deviation Incomplete right bundle branch block No previous ECGs available Confirmed by Liborio Hai reddy [phone]) on 03/23/2024 12:08:25 PM    Recent Labs: 06/04/2023: BUN 20;  Creatinine, Ser 1.08; Potassium 4.3; Sodium 138  Recent Lipid Panel No results found for: CHOL, TRIG, HDL, CHOLHDL, VLDL, LDLCALC, LDLDIRECT  Physical Exam:    VS:  BP (!) 142/68 (BP Location: Left Arm)   Pulse 82   Ht 5' 7 (1.702 m)   Wt 177 lb (80.3 kg)   SpO2 96%   BMI 27.72 kg/m     Wt Readings from Last 3 Encounters:  03/23/24 177 lb (80.3 kg)  12/17/23 180 lb 14.4 oz (82.1 kg)  12/17/22 178 lb 11.2 oz (81.1 kg)     GENERAL:  Well nourished, well developed in no acute distress NECK: No JVD; No carotid bruits CARDIAC: Irregularly irregular, S1 and S2 present, no murmurs, no rubs, no gallops CHEST:  Clear to auscultation without rales, wheezing or rhonchi  Extremities: Trace bilateral pitting pedal edema. Pulses bilaterally symmetric with radial 2+ and dorsalis pedis 2+ NEUROLOGIC:  Alert and oriented x 3  Medication Adjustments/Labs and Tests Ordered: Current medicines are reviewed at length with the patient today.  Concerns regarding medicines are outlined above.  Orders Placed This Encounter  Procedures   EKG 12-Lead   Meds ordered this encounter  Medications   rivaroxaban  (XARELTO ) 20 MG TABS tablet    Sig: Take 1 tablet (20 mg total) by mouth daily.    Dispense:  90 tablet    Refill:  3    Signed, Sears Oran reddy Lynzee Lindquist, MD, MPH, [facility]. 03/23/2024 1:03 PM    [facility]

## 2024-03-30 DIAGNOSIS — R2689 Other abnormalities of gait and mobility: Secondary | ICD-10-CM | POA: Diagnosis not present

## 2024-03-30 DIAGNOSIS — S76212A Strain of adductor muscle, fascia and tendon of left thigh, initial encounter: Secondary | ICD-10-CM | POA: Diagnosis not present

## 2024-03-30 DIAGNOSIS — M79605 Pain in left leg: Secondary | ICD-10-CM | POA: Diagnosis not present

## 2024-03-30 DIAGNOSIS — S76212D Strain of adductor muscle, fascia and tendon of left thigh, subsequent encounter: Secondary | ICD-10-CM | POA: Diagnosis not present

## 2024-03-30 DIAGNOSIS — M25552 Pain in left hip: Secondary | ICD-10-CM | POA: Diagnosis not present

## 2024-03-31 ENCOUNTER — Other Ambulatory Visit: Payer: Self-pay | Admitting: Cardiology

## 2024-04-01 DIAGNOSIS — M25552 Pain in left hip: Secondary | ICD-10-CM | POA: Diagnosis not present

## 2024-04-01 DIAGNOSIS — S76212D Strain of adductor muscle, fascia and tendon of left thigh, subsequent encounter: Secondary | ICD-10-CM | POA: Diagnosis not present

## 2024-04-01 DIAGNOSIS — M79605 Pain in left leg: Secondary | ICD-10-CM | POA: Diagnosis not present

## 2024-04-01 DIAGNOSIS — R2689 Other abnormalities of gait and mobility: Secondary | ICD-10-CM | POA: Diagnosis not present

## 2024-04-01 DIAGNOSIS — S76212A Strain of adductor muscle, fascia and tendon of left thigh, initial encounter: Secondary | ICD-10-CM | POA: Diagnosis not present

## 2024-04-04 DIAGNOSIS — R2689 Other abnormalities of gait and mobility: Secondary | ICD-10-CM | POA: Diagnosis not present

## 2024-04-04 DIAGNOSIS — M79605 Pain in left leg: Secondary | ICD-10-CM | POA: Diagnosis not present

## 2024-04-04 DIAGNOSIS — S76212D Strain of adductor muscle, fascia and tendon of left thigh, subsequent encounter: Secondary | ICD-10-CM | POA: Diagnosis not present

## 2024-04-04 DIAGNOSIS — S76212A Strain of adductor muscle, fascia and tendon of left thigh, initial encounter: Secondary | ICD-10-CM | POA: Diagnosis not present

## 2024-04-04 DIAGNOSIS — M25552 Pain in left hip: Secondary | ICD-10-CM | POA: Diagnosis not present

## 2024-04-06 DIAGNOSIS — S76212D Strain of adductor muscle, fascia and tendon of left thigh, subsequent encounter: Secondary | ICD-10-CM | POA: Diagnosis not present

## 2024-04-06 DIAGNOSIS — S76212A Strain of adductor muscle, fascia and tendon of left thigh, initial encounter: Secondary | ICD-10-CM | POA: Diagnosis not present

## 2024-04-06 DIAGNOSIS — R2689 Other abnormalities of gait and mobility: Secondary | ICD-10-CM | POA: Diagnosis not present

## 2024-04-06 DIAGNOSIS — M25552 Pain in left hip: Secondary | ICD-10-CM | POA: Diagnosis not present

## 2024-04-06 DIAGNOSIS — M79605 Pain in left leg: Secondary | ICD-10-CM | POA: Diagnosis not present

## 2024-04-11 DIAGNOSIS — R2689 Other abnormalities of gait and mobility: Secondary | ICD-10-CM | POA: Diagnosis not present

## 2024-04-11 DIAGNOSIS — M79605 Pain in left leg: Secondary | ICD-10-CM | POA: Diagnosis not present

## 2024-04-11 DIAGNOSIS — S76212A Strain of adductor muscle, fascia and tendon of left thigh, initial encounter: Secondary | ICD-10-CM | POA: Diagnosis not present

## 2024-04-11 DIAGNOSIS — S76212D Strain of adductor muscle, fascia and tendon of left thigh, subsequent encounter: Secondary | ICD-10-CM | POA: Diagnosis not present

## 2024-04-11 DIAGNOSIS — M25552 Pain in left hip: Secondary | ICD-10-CM | POA: Diagnosis not present

## 2024-04-12 DIAGNOSIS — Z09 Encounter for follow-up examination after completed treatment for conditions other than malignant neoplasm: Secondary | ICD-10-CM | POA: Diagnosis not present

## 2024-04-13 DIAGNOSIS — R2689 Other abnormalities of gait and mobility: Secondary | ICD-10-CM | POA: Diagnosis not present

## 2024-04-13 DIAGNOSIS — M25552 Pain in left hip: Secondary | ICD-10-CM | POA: Diagnosis not present

## 2024-04-13 DIAGNOSIS — S76212A Strain of adductor muscle, fascia and tendon of left thigh, initial encounter: Secondary | ICD-10-CM | POA: Diagnosis not present

## 2024-04-13 DIAGNOSIS — S76212D Strain of adductor muscle, fascia and tendon of left thigh, subsequent encounter: Secondary | ICD-10-CM | POA: Diagnosis not present

## 2024-04-13 DIAGNOSIS — M79605 Pain in left leg: Secondary | ICD-10-CM | POA: Diagnosis not present

## 2024-05-14 DIAGNOSIS — R55 Syncope and collapse: Secondary | ICD-10-CM | POA: Diagnosis not present

## 2024-05-14 DIAGNOSIS — N189 Chronic kidney disease, unspecified: Secondary | ICD-10-CM | POA: Diagnosis not present

## 2024-05-14 DIAGNOSIS — I509 Heart failure, unspecified: Secondary | ICD-10-CM | POA: Diagnosis not present

## 2024-05-14 DIAGNOSIS — S0003XA Contusion of scalp, initial encounter: Secondary | ICD-10-CM | POA: Diagnosis not present

## 2024-05-14 DIAGNOSIS — M79672 Pain in left foot: Secondary | ICD-10-CM | POA: Diagnosis not present

## 2024-05-14 DIAGNOSIS — I129 Hypertensive chronic kidney disease with stage 1 through stage 4 chronic kidney disease, or unspecified chronic kidney disease: Secondary | ICD-10-CM | POA: Diagnosis not present

## 2024-05-14 DIAGNOSIS — M25551 Pain in right hip: Secondary | ICD-10-CM | POA: Diagnosis not present

## 2024-05-14 DIAGNOSIS — I4891 Unspecified atrial fibrillation: Secondary | ICD-10-CM | POA: Diagnosis not present

## 2024-05-14 DIAGNOSIS — E1122 Type 2 diabetes mellitus with diabetic chronic kidney disease: Secondary | ICD-10-CM | POA: Diagnosis not present

## 2024-05-14 DIAGNOSIS — Z853 Personal history of malignant neoplasm of breast: Secondary | ICD-10-CM | POA: Diagnosis not present

## 2024-05-14 DIAGNOSIS — S7001XA Contusion of right hip, initial encounter: Secondary | ICD-10-CM | POA: Diagnosis not present

## 2024-05-14 DIAGNOSIS — I130 Hypertensive heart and chronic kidney disease with heart failure and stage 1 through stage 4 chronic kidney disease, or unspecified chronic kidney disease: Secondary | ICD-10-CM | POA: Diagnosis not present

## 2024-05-20 DIAGNOSIS — M7989 Other specified soft tissue disorders: Secondary | ICD-10-CM | POA: Diagnosis not present

## 2024-05-20 DIAGNOSIS — I83892 Varicose veins of left lower extremities with other complications: Secondary | ICD-10-CM | POA: Diagnosis not present

## 2024-05-23 DIAGNOSIS — Z7984 Long term (current) use of oral hypoglycemic drugs: Secondary | ICD-10-CM | POA: Diagnosis not present

## 2024-05-23 DIAGNOSIS — Z720 Tobacco use: Secondary | ICD-10-CM | POA: Diagnosis not present

## 2024-05-23 DIAGNOSIS — Z853 Personal history of malignant neoplasm of breast: Secondary | ICD-10-CM | POA: Diagnosis not present

## 2024-05-23 DIAGNOSIS — R911 Solitary pulmonary nodule: Secondary | ICD-10-CM | POA: Diagnosis not present

## 2024-05-23 DIAGNOSIS — S0990XA Unspecified injury of head, initial encounter: Secondary | ICD-10-CM | POA: Diagnosis not present

## 2024-05-23 DIAGNOSIS — E1122 Type 2 diabetes mellitus with diabetic chronic kidney disease: Secondary | ICD-10-CM | POA: Diagnosis not present

## 2024-05-23 DIAGNOSIS — E042 Nontoxic multinodular goiter: Secondary | ICD-10-CM | POA: Diagnosis not present

## 2024-05-23 DIAGNOSIS — I6781 Acute cerebrovascular insufficiency: Secondary | ICD-10-CM | POA: Diagnosis not present

## 2024-05-23 DIAGNOSIS — G2581 Restless legs syndrome: Secondary | ICD-10-CM | POA: Diagnosis not present

## 2024-05-23 DIAGNOSIS — I4819 Other persistent atrial fibrillation: Secondary | ICD-10-CM | POA: Diagnosis not present

## 2024-05-23 DIAGNOSIS — I6782 Cerebral ischemia: Secondary | ICD-10-CM | POA: Diagnosis not present

## 2024-05-23 DIAGNOSIS — R42 Dizziness and giddiness: Secondary | ICD-10-CM | POA: Diagnosis not present

## 2024-05-23 DIAGNOSIS — I110 Hypertensive heart disease with heart failure: Secondary | ICD-10-CM | POA: Diagnosis not present

## 2024-05-23 DIAGNOSIS — R531 Weakness: Secondary | ICD-10-CM | POA: Diagnosis not present

## 2024-05-23 DIAGNOSIS — S0003XA Contusion of scalp, initial encounter: Secondary | ICD-10-CM | POA: Diagnosis not present

## 2024-05-23 DIAGNOSIS — I10 Essential (primary) hypertension: Secondary | ICD-10-CM | POA: Diagnosis not present

## 2024-05-23 DIAGNOSIS — D631 Anemia in chronic kidney disease: Secondary | ICD-10-CM | POA: Diagnosis not present

## 2024-05-23 DIAGNOSIS — E119 Type 2 diabetes mellitus without complications: Secondary | ICD-10-CM | POA: Diagnosis not present

## 2024-05-23 DIAGNOSIS — R0602 Shortness of breath: Secondary | ICD-10-CM | POA: Diagnosis not present

## 2024-05-23 DIAGNOSIS — R519 Headache, unspecified: Secondary | ICD-10-CM | POA: Diagnosis not present

## 2024-05-23 DIAGNOSIS — N189 Chronic kidney disease, unspecified: Secondary | ICD-10-CM | POA: Diagnosis not present

## 2024-05-23 DIAGNOSIS — J1282 Pneumonia due to coronavirus disease 2019: Secondary | ICD-10-CM | POA: Diagnosis not present

## 2024-05-23 DIAGNOSIS — U071 COVID-19: Secondary | ICD-10-CM | POA: Diagnosis not present

## 2024-05-23 DIAGNOSIS — R110 Nausea: Secondary | ICD-10-CM | POA: Diagnosis not present

## 2024-05-23 DIAGNOSIS — I130 Hypertensive heart and chronic kidney disease with heart failure and stage 1 through stage 4 chronic kidney disease, or unspecified chronic kidney disease: Secondary | ICD-10-CM | POA: Diagnosis not present

## 2024-05-23 DIAGNOSIS — R9431 Abnormal electrocardiogram [ECG] [EKG]: Secondary | ICD-10-CM | POA: Diagnosis not present

## 2024-05-23 DIAGNOSIS — I5033 Acute on chronic diastolic (congestive) heart failure: Secondary | ICD-10-CM | POA: Diagnosis not present

## 2024-05-23 DIAGNOSIS — R069 Unspecified abnormalities of breathing: Secondary | ICD-10-CM | POA: Diagnosis not present

## 2024-05-23 DIAGNOSIS — I4891 Unspecified atrial fibrillation: Secondary | ICD-10-CM | POA: Diagnosis not present

## 2024-05-23 DIAGNOSIS — I517 Cardiomegaly: Secondary | ICD-10-CM | POA: Diagnosis not present

## 2024-05-23 DIAGNOSIS — Z96641 Presence of right artificial hip joint: Secondary | ICD-10-CM | POA: Diagnosis not present

## 2024-05-24 DIAGNOSIS — R519 Headache, unspecified: Secondary | ICD-10-CM | POA: Diagnosis not present

## 2024-05-24 DIAGNOSIS — Z720 Tobacco use: Secondary | ICD-10-CM | POA: Diagnosis not present

## 2024-05-24 DIAGNOSIS — N189 Chronic kidney disease, unspecified: Secondary | ICD-10-CM | POA: Diagnosis not present

## 2024-05-24 DIAGNOSIS — I5033 Acute on chronic diastolic (congestive) heart failure: Secondary | ICD-10-CM | POA: Diagnosis not present

## 2024-05-24 DIAGNOSIS — S0990XA Unspecified injury of head, initial encounter: Secondary | ICD-10-CM | POA: Diagnosis not present

## 2024-05-24 DIAGNOSIS — I4819 Other persistent atrial fibrillation: Secondary | ICD-10-CM | POA: Diagnosis not present

## 2024-05-24 DIAGNOSIS — E119 Type 2 diabetes mellitus without complications: Secondary | ICD-10-CM | POA: Diagnosis not present

## 2024-05-24 DIAGNOSIS — I6781 Acute cerebrovascular insufficiency: Secondary | ICD-10-CM | POA: Diagnosis not present

## 2024-05-24 DIAGNOSIS — U071 COVID-19: Secondary | ICD-10-CM | POA: Diagnosis not present

## 2024-05-24 DIAGNOSIS — E042 Nontoxic multinodular goiter: Secondary | ICD-10-CM | POA: Diagnosis not present

## 2024-05-24 DIAGNOSIS — S0003XA Contusion of scalp, initial encounter: Secondary | ICD-10-CM | POA: Diagnosis not present

## 2024-05-24 DIAGNOSIS — R911 Solitary pulmonary nodule: Secondary | ICD-10-CM | POA: Diagnosis not present

## 2024-05-24 DIAGNOSIS — I6782 Cerebral ischemia: Secondary | ICD-10-CM | POA: Diagnosis not present

## 2024-05-24 DIAGNOSIS — J1282 Pneumonia due to coronavirus disease 2019: Secondary | ICD-10-CM | POA: Diagnosis not present

## 2024-05-24 DIAGNOSIS — Z96641 Presence of right artificial hip joint: Secondary | ICD-10-CM | POA: Diagnosis not present

## 2024-05-26 DIAGNOSIS — I5033 Acute on chronic diastolic (congestive) heart failure: Secondary | ICD-10-CM | POA: Diagnosis not present

## 2024-05-26 DIAGNOSIS — J1282 Pneumonia due to coronavirus disease 2019: Secondary | ICD-10-CM | POA: Diagnosis not present

## 2024-05-26 DIAGNOSIS — U071 COVID-19: Secondary | ICD-10-CM | POA: Diagnosis not present

## 2024-05-26 DIAGNOSIS — S0003XA Contusion of scalp, initial encounter: Secondary | ICD-10-CM | POA: Diagnosis not present

## 2024-05-26 DIAGNOSIS — Z720 Tobacco use: Secondary | ICD-10-CM | POA: Diagnosis not present

## 2024-05-26 DIAGNOSIS — I4819 Other persistent atrial fibrillation: Secondary | ICD-10-CM | POA: Diagnosis not present

## 2024-05-26 DIAGNOSIS — N189 Chronic kidney disease, unspecified: Secondary | ICD-10-CM | POA: Diagnosis not present

## 2024-05-26 DIAGNOSIS — E119 Type 2 diabetes mellitus without complications: Secondary | ICD-10-CM | POA: Diagnosis not present

## 2024-05-29 DIAGNOSIS — R0600 Dyspnea, unspecified: Secondary | ICD-10-CM | POA: Diagnosis not present

## 2024-05-29 DIAGNOSIS — Z7901 Long term (current) use of anticoagulants: Secondary | ICD-10-CM | POA: Diagnosis not present

## 2024-05-29 DIAGNOSIS — I110 Hypertensive heart disease with heart failure: Secondary | ICD-10-CM | POA: Diagnosis not present

## 2024-05-29 DIAGNOSIS — I5032 Chronic diastolic (congestive) heart failure: Secondary | ICD-10-CM | POA: Diagnosis not present

## 2024-05-29 DIAGNOSIS — E119 Type 2 diabetes mellitus without complications: Secondary | ICD-10-CM | POA: Diagnosis not present

## 2024-05-29 DIAGNOSIS — R350 Frequency of micturition: Secondary | ICD-10-CM | POA: Diagnosis not present

## 2024-05-29 DIAGNOSIS — I517 Cardiomegaly: Secondary | ICD-10-CM | POA: Diagnosis not present

## 2024-05-29 DIAGNOSIS — I5030 Unspecified diastolic (congestive) heart failure: Secondary | ICD-10-CM | POA: Diagnosis not present

## 2024-05-29 DIAGNOSIS — Z853 Personal history of malignant neoplasm of breast: Secondary | ICD-10-CM | POA: Diagnosis not present

## 2024-05-29 DIAGNOSIS — I4891 Unspecified atrial fibrillation: Secondary | ICD-10-CM | POA: Diagnosis not present

## 2024-05-29 DIAGNOSIS — I700 Atherosclerosis of aorta: Secondary | ICD-10-CM | POA: Diagnosis not present

## 2024-05-29 DIAGNOSIS — R0602 Shortness of breath: Secondary | ICD-10-CM | POA: Diagnosis not present

## 2024-05-29 DIAGNOSIS — I482 Chronic atrial fibrillation, unspecified: Secondary | ICD-10-CM | POA: Diagnosis not present

## 2024-06-07 DIAGNOSIS — I5033 Acute on chronic diastolic (congestive) heart failure: Secondary | ICD-10-CM | POA: Diagnosis not present

## 2024-06-07 DIAGNOSIS — I10 Essential (primary) hypertension: Secondary | ICD-10-CM | POA: Diagnosis not present

## 2024-06-09 DIAGNOSIS — J1282 Pneumonia due to coronavirus disease 2019: Secondary | ICD-10-CM | POA: Diagnosis not present

## 2024-06-09 DIAGNOSIS — Z09 Encounter for follow-up examination after completed treatment for conditions other than malignant neoplasm: Secondary | ICD-10-CM | POA: Diagnosis not present

## 2024-06-09 DIAGNOSIS — R110 Nausea: Secondary | ICD-10-CM | POA: Diagnosis not present

## 2024-06-09 DIAGNOSIS — Z79899 Other long term (current) drug therapy: Secondary | ICD-10-CM | POA: Diagnosis not present

## 2024-06-09 DIAGNOSIS — U071 COVID-19: Secondary | ICD-10-CM | POA: Diagnosis not present

## 2024-06-21 DIAGNOSIS — G2581 Restless legs syndrome: Secondary | ICD-10-CM | POA: Diagnosis not present

## 2024-06-21 DIAGNOSIS — N390 Urinary tract infection, site not specified: Secondary | ICD-10-CM | POA: Diagnosis not present

## 2024-06-21 DIAGNOSIS — E1169 Type 2 diabetes mellitus with other specified complication: Secondary | ICD-10-CM | POA: Diagnosis not present

## 2024-06-21 DIAGNOSIS — D509 Iron deficiency anemia, unspecified: Secondary | ICD-10-CM | POA: Diagnosis not present

## 2024-06-21 DIAGNOSIS — I50810 Right heart failure, unspecified: Secondary | ICD-10-CM | POA: Diagnosis not present

## 2024-06-21 DIAGNOSIS — I10 Essential (primary) hypertension: Secondary | ICD-10-CM | POA: Diagnosis not present

## 2024-06-21 DIAGNOSIS — E785 Hyperlipidemia, unspecified: Secondary | ICD-10-CM | POA: Diagnosis not present

## 2024-06-21 DIAGNOSIS — M1990 Unspecified osteoarthritis, unspecified site: Secondary | ICD-10-CM | POA: Diagnosis not present

## 2024-06-21 DIAGNOSIS — I4891 Unspecified atrial fibrillation: Secondary | ICD-10-CM | POA: Diagnosis not present

## 2024-06-21 DIAGNOSIS — E538 Deficiency of other specified B group vitamins: Secondary | ICD-10-CM | POA: Diagnosis not present

## 2024-06-21 DIAGNOSIS — E559 Vitamin D deficiency, unspecified: Secondary | ICD-10-CM | POA: Diagnosis not present

## 2024-06-21 DIAGNOSIS — R350 Frequency of micturition: Secondary | ICD-10-CM | POA: Diagnosis not present

## 2024-07-05 ENCOUNTER — Other Ambulatory Visit: Payer: Self-pay

## 2024-07-07 ENCOUNTER — Ambulatory Visit

## 2024-07-07 VITALS — BP 120/76 | HR 68 | Ht 67.0 in | Wt 176.6 lb

## 2024-07-07 DIAGNOSIS — E782 Mixed hyperlipidemia: Secondary | ICD-10-CM

## 2024-07-07 DIAGNOSIS — R55 Syncope and collapse: Secondary | ICD-10-CM

## 2024-07-07 DIAGNOSIS — I5032 Chronic diastolic (congestive) heart failure: Secondary | ICD-10-CM

## 2024-07-07 DIAGNOSIS — I4821 Permanent atrial fibrillation: Secondary | ICD-10-CM | POA: Diagnosis not present

## 2024-07-07 MED ORDER — ALBUTEROL SULFATE HFA 108 (90 BASE) MCG/ACT IN AERS: 2.0000 | INHALATION_SPRAY | Freq: Four times a day (QID) | RESPIRATORY_TRACT | 3 refills | Status: AC | PRN

## 2024-07-07 MED ORDER — METOPROLOL TARTRATE 25 MG PO TABS: 12.5000 mg | ORAL_TABLET | Freq: Two times a day (BID) | ORAL | 3 refills | Status: AC

## 2024-07-07 MED ORDER — RIVAROXABAN 15 MG PO TABS: 15.0000 mg | ORAL_TABLET | Freq: Every day | ORAL | 3 refills | Status: AC

## 2024-07-07 NOTE — Assessment & Plan Note (Signed)
 She had a fall, she was unclear if she passed out back on 05/13/2024. Has not had any further recurrent symptoms.  She has been currently maintained on metoprolol  12.5 mg twice daily.  Will obtain Zio patch to assess her heart rate response to rule out any significant bradycardia or tachyarrhythmias contributing to her symptoms. Will review further need for metoprolol  based on test results.  Will have her return for follow-up tentatively in 2 months.

## 2024-07-07 NOTE — Assessment & Plan Note (Signed)
 Recent lipid panel 06/21/2024 total cholesterol 144, HDL 53, LDL 69, triglycerides 122, well-controlled. Continue with rosuvastatin 40 mg once daily.

## 2024-07-07 NOTE — Patient Instructions (Signed)
 Medication Instructions:  Your physician has recommended you make the following change in your medication:   START: Xarelto  15 mg daily  *If you need a refill on your cardiac medications before your next appointment, please call your pharmacy*  Lab Work: None If you have labs (blood work) drawn today and your tests are completely normal, you will receive your results only by: MyChart Message (if you have MyChart) OR A paper copy in the mail If you have any lab test that is abnormal or we need to change your treatment, we will call you to review the results.  Testing/Procedures: A zio monitor was ordered today. It will remain on for 14 days. You will then return monitor and event diary in provided box. It takes 1-2 weeks for report to be downloaded and returned to us . We will call you with the results. If monitor falls off or has orange flashing light, please call Zio for further instructions.    Follow-Up: At [facility], you and your health needs are our priority.  As part of our continuing mission to provide you with exceptional heart care, our providers are all part of one team.  This team includes your primary Cardiologist (physician) and Advanced Practice Providers or APPs (Physician Assistants and Nurse Practitioners) who all work together to provide you with the care you need, when you need it.  Your next appointment:   2 month(s)  Provider:   Alean Kobus, MD    We recommend signing up for the patient portal called MyChart.  Sign up information is provided on this After Visit Summary.  MyChart is used to connect with patients for Virtual Visits (Telemedicine).  Patients are able to view lab/test results, encounter notes, upcoming appointments, etc.  Non-urgent messages can be sent to your provider as well.   To learn more about what you can do with MyChart, go to [email or web address].   Other Instructions None

## 2024-07-07 NOTE — Progress Notes (Signed)
 Cardiology Consultation:    Date:  07/07/2024   ID:  Lori Rodriguez, DOB 07/14/1935, MRN [id number]  PCP:  Lori Therisa RAMAN, NP  Cardiologist:  Lori SAUNDERS Claron Rosencrans, MD   Referring MD: Lori Therisa RAMAN, NP   No chief complaint on file.    ASSESSMENT AND PLAN:   [name] 89 year old woman with history of permanent atrial fibrillation, hypertension, chronic CHF with preserved EF, anemia, orthostatic hypotension, echocardiogram 05/19/2023 at [facility] LVEF 60 to 65%, severe biatrial dilatation, normal RV size and low normal function, moderate TR with moderate pulmonary hypertension RVSP 59 mmHg, hyperlipidemia, diabetes mellitus, epistaxis [resolved after ENT follow-up], CKD stage III, recurrent falls.  Problem List Items Addressed This Visit     Permanent atrial fibrillation (HCC)   Rates well-controlled. Remains asymptomatic. CHA2DS2-VASc score 5.  Remains on anticoagulation with Xarelto . Dose had been cut down during inpatient stay to 15 mg once daily.  However mentions she is taking 10 mg at home. Given her recent renal function creatinine 0.9, eGFR 62 and creatinine clearance 42 on 06/21/2024, agree with continuing Xarelto  15 mg once daily. Will send prescription for renewal as she requested.  Remains on low-dose metoprolol  tartrate 12.5 mg twice daily.       Relevant Medications   metoprolol  tartrate (LOPRESSOR ) 25 MG tablet   Rivaroxaban  (XARELTO ) 15 MG TABS tablet   Mixed hyperlipidemia   Recent lipid panel 06/21/2024 total cholesterol 144, HDL 53, LDL 69, triglycerides 122, well-controlled. Continue with rosuvastatin 40 mg once daily.       Relevant Medications   metoprolol  tartrate (LOPRESSOR ) 25 MG tablet   Rivaroxaban  (XARELTO ) 15 MG TABS tablet   CHF (congestive heart failure) (HCC)   Appears compensated. Mildly hypervolemic with trace bilateral ankle edema. Functional status limited due to advanced age and deconditioning. NYHA class  II.  Continue with salt restriction below 2 g/day. Continue diuretic torsemide  20 mg once a day. Advised to continue monitoring her weight. Weight today 176 pounds at office and 175 pounds at home. If weight goes up by 2 to 3 pounds in a day or 4 to 5 pounds aerobic, an additional dose of torsemide  in the afternoon may be taken.  Guideline-directed medical therapy. Continue with low-dose metoprolol  tartrate 12.5 mg twice daily With her risk for recurrent falls, fluctuating renal function, we will hold off on adding Entresto or SGLT2 inhibitors.       Relevant Medications   metoprolol  tartrate (LOPRESSOR ) 25 MG tablet   Rivaroxaban  (XARELTO ) 15 MG TABS tablet   Syncope and collapse - Primary   She had a fall, she was unclear if she passed out back on 05/13/2024. Has not had any further recurrent symptoms.  She has been currently maintained on metoprolol  12.5 mg twice daily.  Will obtain Zio patch to assess her heart rate response to rule out any significant bradycardia or tachyarrhythmias contributing to her symptoms. Will review further need for metoprolol  based on test results.  Will have her return for follow-up tentatively in 2 months.      Relevant Orders   LONG TERM MONITOR (3-14 DAYS)   Return to clinic in 2 months.   History of Present Illness:    Lori Rodriguez is a 89 y.o. female who is being seen today for follow-up visit. PCP is Potts, Therisa RAMAN, NP. Last visit with me in the office was 03/23/2024.  Pleasant woman here for the visit by herself.  Lives at home  with her daughter.  history of permanent atrial fibrillation, hypertension, chronic CHF with preserved EF, anemia, orthostatic hypotension, echocardiogram 05/19/2023 at [facility] LVEF 60 to 65%, severe biatrial dilatation, normal RV size and low normal function, moderate TR with moderate pulmonary hypertension RVSP 59 mmHg, hyperlipidemia, diabetes mellitus, epistaxis [resolved after ENT  follow-up], CKD stage III, recurrent falls.  Mentions on August 22 she sustained a fall while trying to pick the cane up from the floor of her car fell backwards.  She was unsure if she lost consciousness but believes she might have briefly passed out.  Did not seek immediate medical care.  Went to the ER next day 05/14/2024 for headache and was evaluated at [facility], CT head noted no intracranial abnormalities and observed large right parieto-occipital scalp hematoma. No further falls or syncope.  She had recent admission and discharge [05/23/2024 through 05/26/2024 at [facility]] to the hospital where she presented for symptoms of shortness of breath and productive cough for about a week and increase bilateral lower extremity edema, in the setting of COVID-pneumonia where she was also treated for acute on chronic heart failure with IV diuretics.  There was mention about slow heart rates and A-fib during inpatient stay prompting switching her from labetalol  100 mg once daily dose [her chronic beta-blocker dose] to metoprolol  tartrate 12.5 mg twice daily.  Subsequently she had a visit 06/07/2024 at [facility] cardiology seen by Cooper Boettcher PA.  She is requesting refills on albuterol , metoprolol  tartrate 12.5 mg twice daily, rivaroxaban  [currently taking 10 mg once daily].  Blood work from 06/21/2024 shows total cholesterol 144, HDL 53, LDL 69, triglycerides 122. Hemoglobin A1c 7.5 Creatinine 0.9, eGFR 62 and creatinine clearance 42  Mentions she has been doing well. Continues to use a walker to ambulate.  Overall functional capacity is limited due to her deconditioning. Denies any chest pain. Ankle edema is at baseline. Denies any significant increase. Denies any palpitations, chest pain, orthopnea.  Denies any recent falls. Good compliance with her medications.  Weight at home 175 pounds.  Past Medical History:  Diagnosis Date   Anemia     Anginal pain    Atrial fibrillation (HCC)    Cancer (HCC)    Left Breast Cancer   Cancer of central portion of left female breast (HCC) 03/10/2016   CHF (congestive heart failure) (HCC)    Chronic back pain    Chronic kidney disease    Right Renal Mass   Diabetes mellitus without complication (HCC)    DJD (degenerative joint disease)    Dysrhythmia    Atrial fibrillation   Encounter for preprocedural cardiovascular examination 02/28/2016   Formatting of this note might be different from the original. Overview:  Remote normal coronary arteriography and echo 2015 with normal EF% Formatting of this note might be different from the original. Remote normal coronary arteriography and echo 2015 with normal EF%   Estrogen receptor positive neoplasm 03/10/2016   Femur fracture (HCC) 01/21/2021   Hypertension    Hypertensive heart disease 02/28/2016   Left leg swelling 12/17/2023   Long term (current) use of anticoagulants 02/29/2016   Overview:  Overview:  apixaban Overview:  apixaban  Formatting of this note might be different from the original. Overview:  apixaban Formatting of this note might be different from the original. apixaban   Malignant neoplasm of upper-inner quadrant of left female breast (HCC) 03/10/2016   Mixed hyperlipidemia 02/29/2016   Multiple  thyroid  nodules 12/17/2023   Orthostatic hypotension 12/01/2018   Osteoporosis    Permanent atrial fibrillation (HCC) 02/28/2016   Overview:   Overview:   CHADS2 vasc score= 4     Overview:   CHADS2 vasc score= 4  Overview:   CHADS2 vasc score= 4     Formatting of this note might be different from the original.  Overview:   CHADS2 vasc score= 4     Overview:   CHADS2 vasc score= 4  Formatting of this note might be different from the original.  CHADS2 vasc score= 4     Persistent atrial fibrillation (HCC) 02/28/2016   Overview:  Overview:  CHADS2 vasc score= 4  Overview:  CHADS2 vasc score= 4 Overview:  CHADS2 vasc score= 4  Formatting of  this note might be different from the original. Overview:  CHADS2 vasc score= 4  Overview:  CHADS2 vasc score= 4 Formatting of this note might be different from the original. CHADS2 vasc score= 4   Pressure injury of skin 01/27/2021   Pulmonary nodule 12/17/2023   Right renal mass    Secondary pulmonary arterial hypertension (HCC) 11/03/2018   Type 2 diabetes mellitus (HCC) 02/29/2016   Vitamin B12 deficiency    Vitamin D deficiency     Past Surgical History:  Procedure Laterality Date   BREAST LUMPECTOMY Left    CARDIAC CATHETERIZATION     CATARACT EXTRACTION, BILATERAL     CHOLECYSTECTOMY     IR RADIOLOGIST EVAL & MGMT  09/29/2018   IR RADIOLOGIST EVAL & MGMT  05/12/2019   IR RADIOLOGIST EVAL & MGMT  05/22/2020   TOTAL HIP ARTHROPLASTY Bilateral    TOTAL HIP REVISION Right 01/24/2021   Procedure: TOTAL HIP REVISION;  Surgeon: Fidel Rogue, MD;  Location: [facility];  Service: Orthopedics;  Laterality: Right;    Current Medications: Current Meds  Medication Sig   acetaminophen  (TYLENOL ) 325 MG tablet Take 1-2 tablets (325-650 mg total) by mouth every 6 (six) hours as needed for mild pain (pain score 1-3 or temp > 100.5).   acidophilus (RISAQUAD) CAPS capsule Take 1 capsule by mouth daily.   Calcium Carbonate-Vitamin D (CALCIUM-VITAMIN D3) 600-125 MG-UNIT TABS Take 1 capsule by mouth daily.   Cyanocobalamin  (VITAMIN B-12) 5000 MCG TBDP Take 5,000 mcg by mouth daily. Take 2 tablets daily   ferrous sulfate 325 (65 FE) MG tablet Take 3 tablets by mouth daily.   fluticasone furoate-vilanterol (BREO ELLIPTA) 100-25 MCG/INH AEPB Inhale 1 puff into the lungs daily.   glipiZIDE (GLUCOTROL XL) 5 MG 24 hr tablet Take 5 mg by mouth daily.   glucosamine-chondroitin 500-400 MG tablet Take 1 tablet by mouth daily.   MAGNESIUM PO Take 1 tablet by mouth daily.   metFORMIN (GLUCOPHAGE) 1000 MG tablet Take 1,000 mg by mouth 2 (two) times daily with a meal.   ondansetron  (ZOFRAN ) 4 MG tablet Take 4 mg by  mouth every 8 (eight) hours as needed for nausea or vomiting.   OVER THE COUNTER MEDICATION Take 1 tablet by mouth daily. Pro-15 5 billion cfu 15 strains/   OVER THE COUNTER MEDICATION Take 2 tablets by mouth at bedtime. Stool Softner/ Unknown strength   oxybutynin (DITROPAN) 5 MG tablet 5 mg 2 (two) times daily.   Rivaroxaban  (XARELTO ) 15 MG TABS tablet Take 1 tablet (15 mg total) by mouth daily.   rOPINIRole (REQUIP) 0.25 MG tablet Take by mouth.   rosuvastatin (CRESTOR) 40 MG tablet Take 40 mg by mouth daily.  sertraline  (ZOLOFT ) 100 MG tablet Take 100 mg by mouth daily.    torsemide  (DEMADEX ) 20 MG tablet Take one tablet by mouth daily in the morning. Take an extra tablet in the evening if you weigh 174 or greater.   traMADol (ULTRAM) 50 MG tablet Take 50 mg by mouth 2 (two) times daily.   Vitamin D, Ergocalciferol, (DRISDOL) 1.25 MG (50000 UNIT) CAPS capsule Take 50,000 Units by mouth every 7 (seven) days.   [DISCONTINUED] albuterol  (PROVENTIL  HFA;VENTOLIN  HFA) 108 (90 Base) MCG/ACT inhaler Inhale 2 puffs into the lungs 4 (four) times daily as needed for wheezing or shortness of breath.   [DISCONTINUED] loperamide (IMODIUM A-D) 2 MG tablet Take 2 mg by mouth 4 (four) times daily as needed for diarrhea or loose stools.   [DISCONTINUED] metoprolol  tartrate (LOPRESSOR ) 25 MG tablet Take 12.5 mg by mouth 2 (two) times daily.   [DISCONTINUED] rivaroxaban  (XARELTO ) 10 MG TABS tablet Take 10 mg by mouth daily.   [DISCONTINUED] rivaroxaban  (XARELTO ) 20 MG TABS tablet Take 1 tablet (20 mg total) by mouth daily.     Allergies:   Lisinopril, Oxycodone, Percocet [oxycodone-acetaminophen ], Valium [diazepam], and Prednisone   Social History   Socioeconomic History   Marital status: Widowed    Spouse name: Not on file   Number of children: Not on file   Years of education: Not on file   Highest education level: Not on file  Occupational History   Not on file  Tobacco Use   Smoking status: Never     Passive exposure: Never   Smokeless tobacco: Never  Vaping Use   Vaping status: Never Used  Substance and Sexual Activity   Alcohol use: Never   Drug use: Never   Sexual activity: Not on file  Other Topics Concern   Not on file  Social History Narrative   Not on file   Social Drivers of Health   Financial Resource Strain: Low Risk  (05/24/2024)   Received from [facility] the [employer] Strain (CARDIA)    Difficulty of Paying Living Expenses: Not hard at all  Food Insecurity: No Food Insecurity (05/24/2024)   Received from [facility] [place]   Hunger Vital Sign    Within the past 12 months, you worried that your food would run out before you got the money to buy more.: Never true    Within the past 12 months, the food you bought just didn't last and you didn't have money to get more.: Never true  Transportation Needs: No Transportation Needs (05/24/2024)   Received from [place] [facility] the [employer] - Transportation    Lack of Transportation (Medical): No    Lack of Transportation (Non-Medical): No  Physical Activity: Not on file  Stress: Not on file  Social Connections: Feeling Socially Integrated (07/02/2023)   Received from [facility] the [place]   OASIS D0700: Social Isolation    Frequency of experiencing loneliness or isolation: Never     Family History: The patient's family history includes Diabetes in her brother; Heart attack in her brother and father; Myasthenia gravis in her brother. ROS:   Please see the history of present illness.    All 14 point review of systems negative except as described per history of present illness.  EKGs/Labs/Other Studies Reviewed:    The following studies were reviewed today:   EKG:       Recent Labs: No results found for requested labs within last 365  days.  Recent Lipid Panel No results found for: CHOL, TRIG, HDL, CHOLHDL, VLDL, LDLCALC,  LDLDIRECT  Physical Exam:    VS:  BP 120/76   Pulse 68   Ht 5' 7 (1.702 m)   Wt 176 lb 9.6 oz (80.1 kg)   SpO2 98%   BMI 27.66 kg/m     Wt Readings from Last 3 Encounters:  07/07/24 176 lb 9.6 oz (80.1 kg)  03/23/24 177 lb (80.3 kg)  12/17/23 180 lb 14.4 oz (82.1 kg)     GENERAL:  Well nourished, well developed in no acute distress NECK: No JVD; No carotid bruits CARDIAC: RRR, S1 and S2 present, no murmurs, no rubs, no gallops CHEST:  Clear to auscultation without rales, wheezing or rhonchi  Extremities: Bilateral pitting ankle edema. Pulses bilaterally symmetric with radial 2+ and dorsalis pedis 2+ NEUROLOGIC:  Alert and oriented x 3  Medication Adjustments/Labs and Tests Ordered: Current medicines are reviewed at length with the patient today.  Concerns regarding medicines are outlined above.  Orders Placed This Encounter  Procedures   LONG TERM MONITOR (3-14 DAYS)   Meds ordered this encounter  Medications   metoprolol  tartrate (LOPRESSOR ) 25 MG tablet    Sig: Take 0.5 tablets (12.5 mg total) by mouth 2 (two) times daily.    Dispense:  90 tablet    Refill:  3   albuterol  (VENTOLIN  HFA) 108 (90 Base) MCG/ACT inhaler    Sig: Inhale 2 puffs into the lungs 4 (four) times daily as needed for wheezing or shortness of breath.    Dispense:  1 each    Refill:  3   Rivaroxaban  (XARELTO ) 15 MG TABS tablet    Sig: Take 1 tablet (15 mg total) by mouth daily.    Dispense:  90 tablet    Refill:  3    Signed, Kemyra August reddy Jadzia Ibsen, MD, MPH, [facility]. 07/07/2024 12:38 PM    [facility]

## 2024-07-07 NOTE — Assessment & Plan Note (Signed)
 Appears compensated. Mildly hypervolemic with trace bilateral ankle edema. Functional status limited due to advanced age and deconditioning. NYHA class II.  Continue with salt restriction below 2 g/day. Continue diuretic torsemide  20 mg once a day. Advised to continue monitoring her weight. Weight today 176 pounds at office and 175 pounds at home. If weight goes up by 2 to 3 pounds in a day or 4 to 5 pounds aerobic, an additional dose of torsemide  in the afternoon may be taken.  Guideline-directed medical therapy. Continue with low-dose metoprolol  tartrate 12.5 mg twice daily With her risk for recurrent falls, fluctuating renal function, we will hold off on adding Entresto or SGLT2 inhibitors.

## 2024-07-07 NOTE — Assessment & Plan Note (Addendum)
 Rates well-controlled. Remains asymptomatic. CHA2DS2-VASc score 5.  Remains on anticoagulation with Xarelto . Dose had been cut down during inpatient stay to 15 mg once daily.  However mentions she is taking 10 mg at home. Given her recent renal function creatinine 0.9, eGFR 62 and creatinine clearance 42 on 06/21/2024, agree with continuing Xarelto  15 mg once daily. Will send prescription for renewal as she requested.  Remains on low-dose metoprolol  tartrate 12.5 mg twice daily.

## 2024-07-14 ENCOUNTER — Other Ambulatory Visit: Payer: Self-pay | Admitting: Cardiology

## 2024-08-19 ENCOUNTER — Ambulatory Visit: Payer: Self-pay

## 2024-08-19 DIAGNOSIS — R55 Syncope and collapse: Secondary | ICD-10-CM | POA: Diagnosis not present

## 2024-08-28 DIAGNOSIS — S81802A Unspecified open wound, left lower leg, initial encounter: Secondary | ICD-10-CM | POA: Diagnosis not present

## 2024-08-28 DIAGNOSIS — S81801A Unspecified open wound, right lower leg, initial encounter: Secondary | ICD-10-CM | POA: Diagnosis not present

## 2024-08-28 DIAGNOSIS — Z6828 Body mass index (BMI) 28.0-28.9, adult: Secondary | ICD-10-CM | POA: Diagnosis not present

## 2024-08-28 DIAGNOSIS — E663 Overweight: Secondary | ICD-10-CM | POA: Diagnosis not present

## 2024-08-28 DIAGNOSIS — R2242 Localized swelling, mass and lump, left lower limb: Secondary | ICD-10-CM | POA: Diagnosis not present

## 2024-08-28 DIAGNOSIS — L0390 Cellulitis, unspecified: Secondary | ICD-10-CM | POA: Diagnosis not present

## 2024-09-02 DIAGNOSIS — I83892 Varicose veins of left lower extremities with other complications: Secondary | ICD-10-CM | POA: Diagnosis not present

## 2024-09-06 DIAGNOSIS — N2889 Other specified disorders of kidney and ureter: Secondary | ICD-10-CM | POA: Diagnosis not present

## 2024-09-07 ENCOUNTER — Ambulatory Visit

## 2024-09-07 VITALS — BP 142/76 | HR 76 | Ht 67.0 in | Wt 184.5 lb

## 2024-09-07 DIAGNOSIS — I110 Hypertensive heart disease with heart failure: Secondary | ICD-10-CM

## 2024-09-07 DIAGNOSIS — E782 Mixed hyperlipidemia: Secondary | ICD-10-CM | POA: Diagnosis not present

## 2024-09-07 DIAGNOSIS — R55 Syncope and collapse: Secondary | ICD-10-CM | POA: Diagnosis not present

## 2024-09-07 DIAGNOSIS — I4821 Permanent atrial fibrillation: Secondary | ICD-10-CM | POA: Diagnosis not present

## 2024-09-07 DIAGNOSIS — I5032 Chronic diastolic (congestive) heart failure: Secondary | ICD-10-CM

## 2024-09-07 MED ORDER — TORSEMIDE 20 MG PO TABS: ORAL_TABLET | ORAL | 3 refills | Status: AC

## 2024-09-07 NOTE — Assessment & Plan Note (Signed)
 Well-controlled. Continue rosuvastatin 40 mg once daily.

## 2024-09-07 NOTE — Assessment & Plan Note (Addendum)
 Rates well-controlled on heart monitor October 2025. Occasional ventricular ectopy 4.4% burden at times symptomatic. No sustained or nonsustained runs of ventricular ectopy.  Remains on anticoagulation with Xarelto  15 mg once daily

## 2024-09-07 NOTE — Progress Notes (Signed)
 Cardiology Consultation:    Date:  09/07/2024   ID:  Lori Rodriguez, DOB 1935/05/27, MRN [id number]  PCP:  Lori Therisa RAMAN, NP  Cardiologist:  Lori SAUNDERS Taila Basinski, MD   Referring MD: Lori Therisa RAMAN, NP   No chief complaint on file.    ASSESSMENT AND PLAN:   Lori Rodriguez 89/F  permanent atrial fibrillation, hypertension, chronic CHF with preserved EF, anemia, orthostatic hypotension, echocardiogram 05/19/2023 at [facility] 60 to 65%, severe biatrial dilatation, normal RV size and low normal function, moderate TR with moderate pulmonary hypertension RVSP 59 mmHg, hyperlipidemia, diabetes mellitus, epistaxis [resolved after ENT follow-up], CKD stage III, recurrent falls.     Zio patch monitor 14-day study Tober 2025 noted permanent atrial fibrillation with average heart rate 78/min [ranging from 40 to 173 bpm], occasional ventricular ectopy burden 4.4%, no high-grade AV blocks or pauses.  Patient triggered events totaled 9 correlated mostly with rate controlled atrial fibrillation and isolated ventricular ectopy.  Here for routine follow-up. Problem List Items Addressed This Visit       Cardiovascular and Mediastinum   Hypertensive heart disease   Given elderly age Target blood pressure below 140/80 mmHg. Continue current medication metoprolol  tartrate 12.5 mg twice daily.       Relevant Medications   torsemide  (DEMADEX ) 20 MG tablet   Permanent atrial fibrillation (HCC)   Rates well-controlled on heart monitor October 2025. Occasional ventricular ectopy 4.4% burden at times symptomatic. No sustained or nonsustained runs of ventricular ectopy.  Remains on anticoagulation with Xarelto  15 mg once daily      Relevant Medications   torsemide  (DEMADEX ) 20 MG tablet   CHF (congestive heart failure) (HCC) - Primary   Appears compensated. Mildly hypervolemic bilateral lower extremity edema extending about the ankles up to the calves.  Continue salt  restriction less than 2 g/day. Continue torsemide  titrate up the dose to 40 mg once a day for the next 2 days after that starting from next week take 40 mg in the morning on the weekends. Rest of the week days continue with torsemide  20 mg once a day.  Advised to take additional dose of torsemide  in the afternoon if weight goes up by 2 to 3 pounds in a day or 4 to 5 pounds in a week. Hold off on aggressive diuresis given her tendency for frequent falls.  Continue with low-dose metoprolol  to tartrate 12.5 mg twice daily. Currently not a candidate for further escalation of therapy given her recurrent falls and risk for UTI.      Relevant Medications   torsemide  (DEMADEX ) 20 MG tablet     Other   Mixed hyperlipidemia   Well-controlled. Continue rosuvastatin 40 mg once daily.       Relevant Medications   torsemide  (DEMADEX ) 20 MG tablet   Syncope and collapse   No further syncopal episodes. Continues to have recurrent falls due to weakness and which would benefit significantly with ongoing physical therapy rehab and mobility aids. Currently uses a walker to ambulate.  Recommended to follow-up closely with her PCP. She wishes not to go to inpatient rehab and wants to keep her home.     Return to clinic in 3 months.    History of Present Illness:    Lori Rodriguez is a 89 y.o. female who is being seen today for follow-up visit. PCP is Potts, Therisa RAMAN, NP. Last visit with me in the office was 07/07/2024.  Lives at home  with her daughter.  Mentions her daughter herself has health issues.   Has history of  permanent atrial fibrillation, hypertension, chronic CHF with preserved EF, anemia, orthostatic hypotension, echocardiogram 05/19/2023 at [facility] [place] [facility] 60 to 65%, severe biatrial dilatation, normal RV size and low normal function, moderate TR with moderate pulmonary hypertension RVSP 59 mmHg, hyperlipidemia, diabetes mellitus, epistaxis [resolved after ENT  follow-up], CKD stage III, recurrent falls.    Zio patch monitor 14-day study October 2025 noted permanent atrial fibrillation with average heart rate 78/min [ranging from 40 to 173 bpm], occasional ventricular ectopy burden 4.4%, no high-grade AV blocks or pauses.  Patient triggered events totaled 9 correlated mostly with rate controlled atrial fibrillation and isolated ventricular ectopy.  Overall mentions she is doing well from cardiac standpoint in terms of her breathing. Bilateral lower extremity edema is worse towards the end of the day improves in the morning but however has been persistent. She feels torsemide  current dose 20 mg once daily is not helping much. She however has not taken extra doses as the weight gain since last office visit has been gradual.  Denies any chest pain, palpitations, lightheadedness. Does lose balance and strength in her legs and falls down occasionally.  In the last 2 weeks she describes 3 falls at home while walking with her walker and was able to pull herself up.  No loss of consciousness.  She has received physical therapy in the past felt it helped a little bit but she continues to fall.  She chooses not to go for inpatient rehab but she wants to keep her house.  Denies any blood in urine or stools. Blood work from 06/21/2024 noted lipid panel total cholesterol 144, triglycerides 122, HDL 53 and LDL 69, well-controlled. BUN 19, creatinine 0.9, eGFR 62 Sodium 138 and potassium 5.5. Normal transaminases and alkaline phosphatase.  Past Medical History:  Diagnosis Date   Anemia    Anginal pain    Atrial fibrillation (HCC)    Cancer (HCC)    Left Breast Cancer   Cancer of central portion of left female breast (HCC) 03/10/2016   CHF (congestive heart failure) (HCC)    Chronic back pain    Chronic kidney disease    Right Renal Mass   Diabetes mellitus without complication (HCC)    DJD (degenerative joint disease)    Dysrhythmia    Atrial  fibrillation   Encounter for preprocedural cardiovascular examination 02/28/2016   Formatting of this note might be different from the original. Overview:  Remote normal coronary arteriography and echo 2015 with normal EF% Formatting of this note might be different from the original. Remote normal coronary arteriography and echo 2015 with normal EF%   Estrogen receptor positive neoplasm 03/10/2016   Femur fracture (HCC) 01/21/2021   Hypertension    Hypertensive heart disease 02/28/2016   Left leg swelling 12/17/2023   Long term (current) use of anticoagulants 02/29/2016   Overview:  Overview:  apixaban Overview:  apixaban  Formatting of this note might be different from the original. Overview:  apixaban Formatting of this note might be different from the original. apixaban   Malignant neoplasm of upper-inner quadrant of left female breast (HCC) 03/10/2016   Mixed hyperlipidemia 02/29/2016   Multiple thyroid  nodules 12/17/2023   Orthostatic hypotension 12/01/2018   Osteoporosis    Permanent atrial fibrillation (HCC) 02/28/2016   Overview:   Overview:   CHADS2 vasc score= 4     Overview:   CHADS2 vasc score= 4  Overview:   CHADS2 vasc score= 4     Formatting of this note might be different from the original.  Overview:   CHADS2 vasc score= 4     Overview:   CHADS2 vasc score= 4  Formatting of this note might be different from the original.  CHADS2 vasc score= 4     Persistent atrial fibrillation (HCC) 02/28/2016   Overview:  Overview:  CHADS2 vasc score= 4  Overview:  CHADS2 vasc score= 4 Overview:  CHADS2 vasc score= 4  Formatting of this note might be different from the original. Overview:  CHADS2 vasc score= 4  Overview:  CHADS2 vasc score= 4 Formatting of this note might be different from the original. CHADS2 vasc score= 4   Pressure injury of skin 01/27/2021   Pulmonary nodule 12/17/2023   Right renal mass    Secondary pulmonary arterial hypertension (HCC) 11/03/2018   Syncope and collapse  07/07/2024   Type 2 diabetes mellitus (HCC) 02/29/2016   Vitamin B12 deficiency    Vitamin D deficiency     Past Surgical History:  Procedure Laterality Date   BREAST LUMPECTOMY Left    CARDIAC CATHETERIZATION     CATARACT EXTRACTION, BILATERAL     CHOLECYSTECTOMY     IR RADIOLOGIST EVAL & MGMT  09/29/2018   IR RADIOLOGIST EVAL & MGMT  05/12/2019   IR RADIOLOGIST EVAL & MGMT  05/22/2020   TOTAL HIP ARTHROPLASTY Bilateral    TOTAL HIP REVISION Right 01/24/2021   Procedure: TOTAL HIP REVISION;  Surgeon: Fidel Rogue, MD;  Location: [facility];  Service: Orthopedics;  Laterality: Right;    Current Medications: Active Medications[1]   Allergies:   Lisinopril, Oxycodone, Percocet [oxycodone-acetaminophen ], Valium [diazepam], and Prednisone   Social History   Socioeconomic History   Marital status: Widowed    Spouse name: Not on file   Number of children: Not on file   Years of education: Not on file   Highest education level: Not on file  Occupational History   Not on file  Tobacco Use   Smoking status: Never    Passive exposure: Never   Smokeless tobacco: Never  Vaping Use   Vaping status: Never Used  Substance and Sexual Activity   Alcohol use: Never   Drug use: Never   Sexual activity: Not on file  Other Topics Concern   Not on file  Social History Narrative   Not on file   Social Drivers of Health   Tobacco Use: Low Risk (09/07/2024)   Patient History    Smoking Tobacco Use: Never    Smokeless Tobacco Use: Never    Passive Exposure: Never  Financial Resource Strain: Low Risk (05/24/2024)   Received from [facility] the [employer] Strain (CARDIA)    Difficulty of Paying Living Expenses: Not hard at all  Food Insecurity: No Food Insecurity (05/24/2024)   Received from [facility] the [employer]    Within the past 12 months, you worried that your food would run out before you got the money to buy more.: Never true    Within the  past 12 months, the food you bought just didn't last and you didn't have money to get more.: Never true  Transportation Needs: No Transportation Needs (05/24/2024)   Received from [facility] the [employer] - Transportation    Lack of Transportation (Medical): No    Lack of Transportation (Non-Medical): No  Physical Activity: Not on file  Stress: Not on file  Social Connections: Feeling Socially Integrated (07/02/2023)   Received from [facility] the [place]   OASIS D0700: Social Isolation    Frequency of experiencing loneliness or isolation: Never  Depression (PHQ2-9): Low Risk (07/02/2022)   Depression (PHQ2-9)    PHQ-2 Score: 1  Alcohol Screen: Not on file  Housing: Low Risk (05/24/2024)   Received from [facility] the [facility]    In the last 12 months, was there a time when you were not able to pay the mortgage or rent on time?: No    In the past 12 months, how many times have you moved where you were living?: 1    At any time in the past 12 months, were you homeless or living in a shelter (including now)?: No  Utilities: Not At Risk (05/24/2024)   Received from [facility] the [facility] Utilities    Threatened with loss of utilities: No  Health Literacy: Adequate Health Literacy (07/02/2023)   Received from [facility] of the [place]   OASIS B1300: Health Literacy    Frequency of needing help to read materials from doctor or pharmacy: Never     Family History: The patient's family history includes Diabetes in her brother; Heart attack in her brother and father; Myasthenia gravis in her brother. ROS:   Please see the history of present illness.    All 14 point review of systems negative except as described per history of present illness.  EKGs/Labs/Other Studies Reviewed:    The following studies were reviewed today:   EKG:       Recent Labs: No results found for requested labs within last 365 days.  Recent Lipid Panel No results  found for: CHOL, TRIG, HDL, CHOLHDL, VLDL, LDLCALC, LDLDIRECT  Physical Exam:    VS:  BP (!) 142/76   Pulse 76   Ht 5' 7 (1.702 m)   Wt 184 lb 8 oz (83.7 kg)   SpO2 96%   BMI 28.90 kg/m     Wt Readings from Last 3 Encounters:  09/07/24 184 lb 8 oz (83.7 kg)  07/07/24 176 lb 9.6 oz (80.1 kg)  03/23/24 177 lb (80.3 kg)     GENERAL:  Well nourished, well developed in no acute distress NECK: No JVD; No carotid bruits CARDIAC: Irregularly irregular, S1 and S2 present, no murmurs, no rubs, no gallops CHEST:  Clear to auscultation without rales, wheezing or rhonchi  Extremities: Bilateral pitting pedal edema extending up to the calves.  Nontender.  Pulses bilaterally symmetric with radial 2+ and dorsalis pedis 2+ NEUROLOGIC:  Alert and oriented x 3  Medication Adjustments/Labs and Tests Ordered: Current medicines are reviewed at length with the patient today.  Concerns regarding medicines are outlined above.  No orders of the defined types were placed in this encounter.  Meds ordered this encounter  Medications   torsemide  (DEMADEX ) 20 MG tablet    Sig: Take one tablet by mouth daily in the morning. Take an extra tablet ion Saturday and Sunday.    Dispense:  125 tablet    Refill:  3    Signed, Cherita Hebel reddy Sarahelizabeth Conway, MD, MPH, [facility]. 09/07/2024 11:58 AM    [facility]    [1]  Current Meds  Medication Sig   acetaminophen  (TYLENOL ) 325 MG tablet Take 1-2 tablets (325-650 mg total) by mouth every 6 (six) hours as needed for mild pain (pain score 1-3 or temp > 100.5).  acidophilus (RISAQUAD) CAPS capsule Take 1 capsule by mouth daily.   albuterol  (VENTOLIN  HFA) 108 (90 Base) MCG/ACT inhaler Inhale 2 puffs into the lungs 4 (four) times daily as needed for wheezing or shortness of breath.   Calcium Carbonate-Vitamin D (CALCIUM-VITAMIN D3) 600-125 MG-UNIT TABS Take 1 capsule by mouth daily.   Cyanocobalamin  (VITAMIN B-12) 5000 MCG TBDP Take  5,000 mcg by mouth daily. Take 2 tablets daily   ferrous sulfate 325 (65 FE) MG tablet Take 3 tablets by mouth daily.   fluticasone furoate-vilanterol (BREO ELLIPTA) 100-25 MCG/INH AEPB Inhale 1 puff into the lungs daily.   glipiZIDE (GLUCOTROL XL) 5 MG 24 hr tablet Take 5 mg by mouth daily.   MAGNESIUM PO Take 1 tablet by mouth daily.   metFORMIN (GLUCOPHAGE) 1000 MG tablet Take 1,000 mg by mouth 2 (two) times daily with a meal.   metoprolol  tartrate (LOPRESSOR ) 25 MG tablet Take 0.5 tablets (12.5 mg total) by mouth 2 (two) times daily.   ondansetron  (ZOFRAN ) 4 MG tablet Take 4 mg by mouth every 8 (eight) hours as needed for nausea or vomiting.   OVER THE COUNTER MEDICATION Take 1 tablet by mouth daily. Pro-15 5 billion cfu 15 strains/   OVER THE COUNTER MEDICATION Take 2 tablets by mouth at bedtime. Stool Softner/ Unknown strength   Rivaroxaban  (XARELTO ) 15 MG TABS tablet Take 1 tablet (15 mg total) by mouth daily.   rOPINIRole (REQUIP) 0.25 MG tablet Take by mouth.   rosuvastatin (CRESTOR) 40 MG tablet Take 40 mg by mouth daily.   sertraline  (ZOLOFT ) 100 MG tablet Take 100 mg by mouth daily.    tobramycin (TOBREX) 0.3 % ophthalmic solution Place 2 drops into both eyes every 4 (four) hours.   traMADol (ULTRAM) 50 MG tablet Take 50 mg by mouth 2 (two) times daily.   Vitamin D, Ergocalciferol, (DRISDOL) 1.25 MG (50000 UNIT) CAPS capsule Take 50,000 Units by mouth every 7 (seven) days.   [DISCONTINUED] glucosamine-chondroitin 500-400 MG tablet Take 1 tablet by mouth daily.   [DISCONTINUED] torsemide  (DEMADEX ) 20 MG tablet Take one tablet by mouth daily in the morning. Take an extra tablet in the evening if you weigh 174 or greater.

## 2024-09-07 NOTE — Patient Instructions (Signed)
 Medication Instructions:  Your physician has recommended you make the following change in your medication:   Increase your Torsemide  to 40 mg for 2 days. After 2 days of higher dose you will take 20 mg daily and on Saturday/Sunday take 40 mg. Do not start the weekend dose until 09/17/24.  *If you need a refill on your cardiac medications before your next appointment, please call your pharmacy*   Lab Work: None ordered If you have labs (blood work) drawn today and your tests are completely normal, you will receive your results only by: MyChart Message (if you have MyChart) OR A paper copy in the mail If you have any lab test that is abnormal or we need to change your treatment, we will call you to review the results.   Testing/Procedures: None ordered   Follow-Up: At [facility], you and your health needs are our priority.  As part of our continuing mission to provide you with exceptional heart care, we have created designated Provider Care Teams.  These Care Teams include your primary Cardiologist (physician) and Advanced Practice Providers (APPs -  Physician Assistants and Nurse Practitioners) who all work together to provide you with the care you need, when you need it.  We recommend signing up for the patient portal called MyChart.  Sign up information is provided on this After Visit Summary.  MyChart is used to connect with patients for Virtual Visits (Telemedicine).  Patients are able to view lab/test results, encounter notes, upcoming appointments, etc.  Non-urgent messages can be sent to your provider as well.   To learn more about what you can do with MyChart, go to [email or web address].    Your next appointment:   3 month(s)  The format for your next appointment:   In Person  Provider:   Alean Kobus, MD    Other Instructions none  Important Information About Sugar

## 2024-09-07 NOTE — Assessment & Plan Note (Signed)
 No further syncopal episodes. Continues to have recurrent falls due to weakness and which would benefit significantly with ongoing physical therapy rehab and mobility aids. Currently uses a walker to ambulate.  Recommended to follow-up closely with her PCP. She wishes not to go to inpatient rehab and wants to keep her home.

## 2024-09-07 NOTE — Assessment & Plan Note (Signed)
 Given elderly age Target blood pressure below 140/80 mmHg. Continue current medication metoprolol  tartrate 12.5 mg twice daily.

## 2024-09-07 NOTE — Assessment & Plan Note (Signed)
 Appears compensated. Mildly hypervolemic bilateral lower extremity edema extending about the ankles up to the calves.  Continue salt restriction less than 2 g/day. Continue torsemide  titrate up the dose to 40 mg once a day for the next 2 days after that starting from next week take 40 mg in the morning on the weekends. Rest of the week days continue with torsemide  20 mg once a day.  Advised to take additional dose of torsemide  in the afternoon if weight goes up by 2 to 3 pounds in a day or 4 to 5 pounds in a week. Hold off on aggressive diuresis given her tendency for frequent falls.  Continue with low-dose metoprolol  to tartrate 12.5 mg twice daily. Currently not a candidate for further escalation of therapy given her recurrent falls and risk for UTI.

## 2024-12-06 ENCOUNTER — Ambulatory Visit

## 2024-12-20 ENCOUNTER — Ambulatory Visit: Admitting: Oncology
